# Patient Record
Sex: Male | Born: 1950 | Race: Black or African American | Hispanic: No | Marital: Married | Smoking: Former smoker
Health system: Southern US, Community
[De-identification: ages and names within clinical notes are randomized; demographics above are authoritative.]

## PROBLEM LIST (undated history)

## (undated) DIAGNOSIS — I509 Heart failure, unspecified: Secondary | ICD-10-CM

## (undated) DIAGNOSIS — I219 Acute myocardial infarction, unspecified: Secondary | ICD-10-CM

## (undated) DIAGNOSIS — J449 Chronic obstructive pulmonary disease, unspecified: Secondary | ICD-10-CM

## (undated) DIAGNOSIS — I2510 Atherosclerotic heart disease of native coronary artery without angina pectoris: Secondary | ICD-10-CM

## (undated) HISTORY — DX: Heart failure, unspecified: I50.9

## (undated) HISTORY — PX: CORONARY ARTERY BYPASS GRAFT: SHX141

## (undated) HISTORY — PX: CARDIAC DEFIBRILLATOR PLACEMENT: SHX171

## (undated) HISTORY — DX: Chronic obstructive pulmonary disease, unspecified: J44.9

---

## 2006-02-03 ENCOUNTER — Ambulatory Visit: Payer: Self-pay | Admitting: Orthopedic Surgery

## 2007-11-10 ENCOUNTER — Ambulatory Visit: Payer: Self-pay | Admitting: Orthopedic Surgery

## 2007-11-17 ENCOUNTER — Ambulatory Visit: Payer: Self-pay | Admitting: Orthopedic Surgery

## 2010-07-29 ENCOUNTER — Emergency Department: Payer: Self-pay | Admitting: Emergency Medicine

## 2010-11-18 ENCOUNTER — Ambulatory Visit: Payer: Self-pay | Admitting: Internal Medicine

## 2013-01-08 ENCOUNTER — Emergency Department: Payer: Self-pay | Admitting: Emergency Medicine

## 2013-01-08 LAB — URINALYSIS, COMPLETE
Bacteria: NONE SEEN
Bilirubin,UR: NEGATIVE
Blood: NEGATIVE
Glucose,UR: NEGATIVE mg/dL (ref 0–75)
Ketone: NEGATIVE
Leukocyte Esterase: NEGATIVE
Nitrite: NEGATIVE
Protein: NEGATIVE
RBC,UR: 1 /HPF (ref 0–5)
Squamous Epithelial: NONE SEEN

## 2013-01-09 LAB — COMPREHENSIVE METABOLIC PANEL
Anion Gap: 2 — ABNORMAL LOW (ref 7–16)
Bilirubin,Total: 0.3 mg/dL (ref 0.2–1.0)
Chloride: 107 mmol/L (ref 98–107)
Co2: 30 mmol/L (ref 21–32)
Creatinine: 1.03 mg/dL (ref 0.60–1.30)
EGFR (African American): >60
EGFR (Non-African Amer.): >60
Potassium: 3.6 mmol/L (ref 3.5–5.1)
SGOT(AST): 24 U/L (ref 15–37)

## 2013-01-09 LAB — CBC WITH DIFFERENTIAL/PLATELET
Basophil #: 0.1 10*3/uL (ref 0.0–0.1)
HCT: 40.3 % (ref 40.0–52.0)
HGB: 13.2 g/dL (ref 13.0–18.0)
MCHC: 32.7 g/dL (ref 32.0–36.0)
MCV: 77 fL — ABNORMAL LOW (ref 80–100)
Monocyte %: 9.4 %
Neutrophil #: 4.3 10*3/uL (ref 1.4–6.5)
Platelet: 141 10*3/uL — ABNORMAL LOW (ref 150–440)
RBC: 5.26 10*6/uL (ref 4.40–5.90)
RDW: 15.4 % — ABNORMAL HIGH (ref 11.5–14.5)
WBC: 8.9 10*3/uL (ref 3.8–10.6)

## 2013-01-09 LAB — LIPASE, BLOOD: Lipase: 212 U/L (ref 73–393)

## 2014-03-13 ENCOUNTER — Encounter: Admit: 2014-03-13 | Disposition: A | Discharge status: Home / Self Care | Payer: Self-pay

## 2014-03-16 ENCOUNTER — Ambulatory Visit: Payer: Self-pay | Admitting: Gastroenterology

## 2014-04-13 ENCOUNTER — Encounter: Admit: 2014-04-13 | Disposition: A | Discharge status: Home / Self Care | Payer: Self-pay

## 2014-08-29 ENCOUNTER — Other Ambulatory Visit: Payer: Self-pay | Admitting: Nurse Practitioner

## 2014-08-29 DIAGNOSIS — Z959 Presence of cardiac and vascular implant and graft, unspecified: Secondary | ICD-10-CM

## 2014-08-30 ENCOUNTER — Other Ambulatory Visit: Payer: Self-pay | Admitting: Nurse Practitioner

## 2014-08-30 DIAGNOSIS — Z959 Presence of cardiac and vascular implant and graft, unspecified: Secondary | ICD-10-CM

## 2014-08-31 ENCOUNTER — Ambulatory Visit
Admission: RE | Admit: 2014-08-31 | Discharge: 2014-08-31 | Disposition: A | Discharge status: Home / Self Care | Payer: Non-veteran care | Source: Ambulatory Visit | Attending: Nurse Practitioner | Admitting: Nurse Practitioner

## 2014-08-31 DIAGNOSIS — Z959 Presence of cardiac and vascular implant and graft, unspecified: Secondary | ICD-10-CM

## 2014-09-07 ENCOUNTER — Other Ambulatory Visit: Payer: Self-pay

## 2015-02-02 IMAGING — CT CT ABD-PELV W/ CM
2 of 5 series · 16 of 46 positions shown, 18 images · IV contrast (agent unspecified)
Comparison: None.

CLINICAL DATA: Sharp abdominal pain since [DATE]. Constipation.
Lower abdominal pain and.

EXAM:
CT ABDOMEN AND PELVIS WITH CONTRAST
TECHNIQUE: Multidetector CT imaging of the abdomen and pelvis was performed
using the standard protocol following bolus administration of
intravenous contrast.
CONTRAST:  100 mL Osovue-XVV

[Series 2: routine abd pel with · axial · 0.62mm/px · z∈[-449,-34]mm · 13 of 93 slices shown, 15 images]
[im 5/93  soft-tissue]
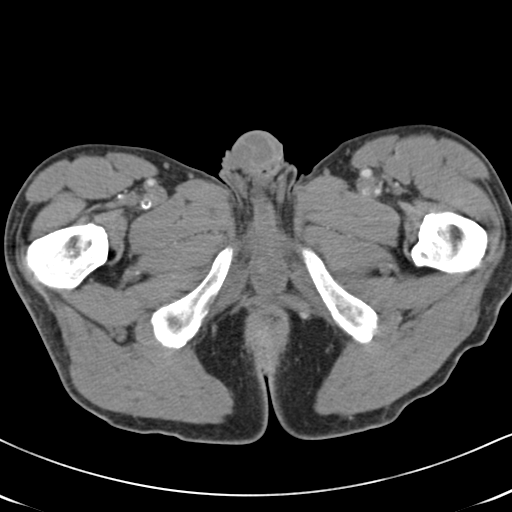
[im 5/93  bone]
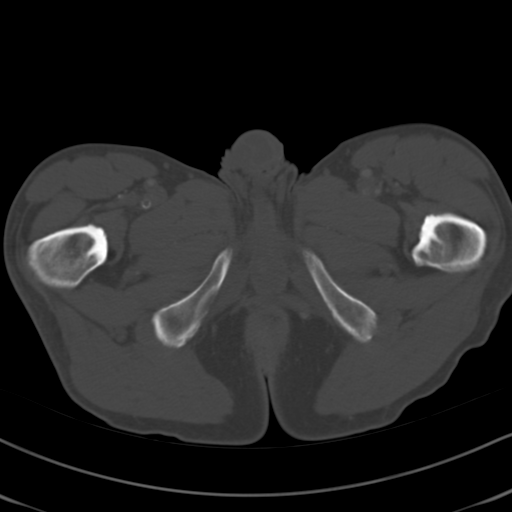
[im 14/93  soft-tissue]
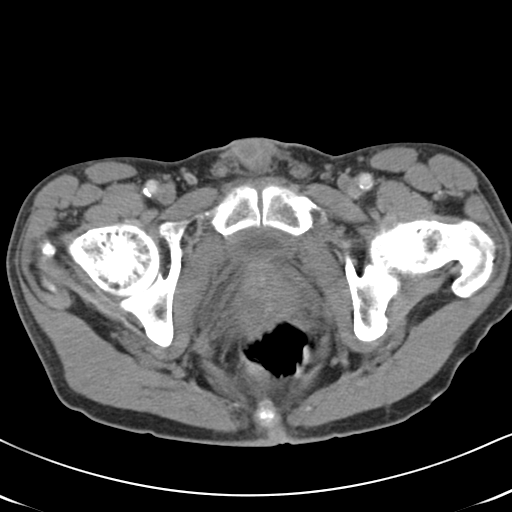
[im 18/93  soft-tissue]
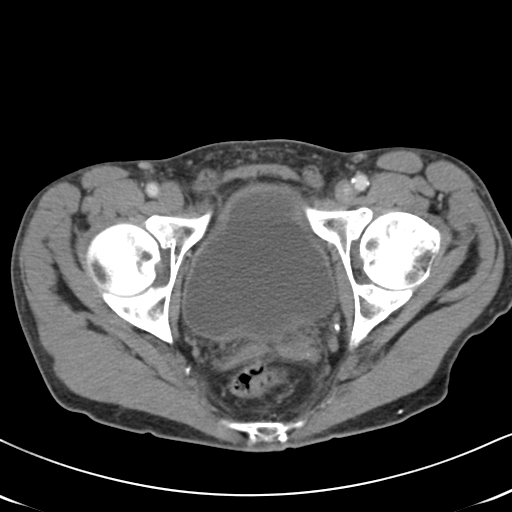
[im 27/93  soft-tissue]
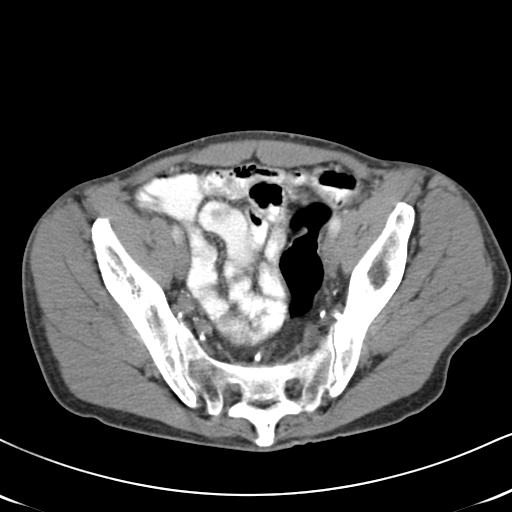
[im 31/93  soft-tissue]
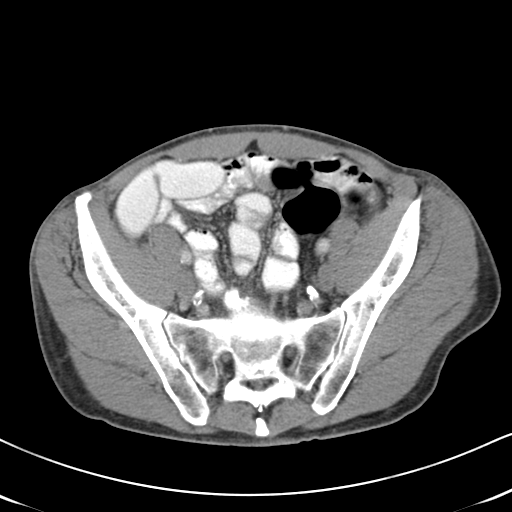
[im 40/93  soft-tissue]
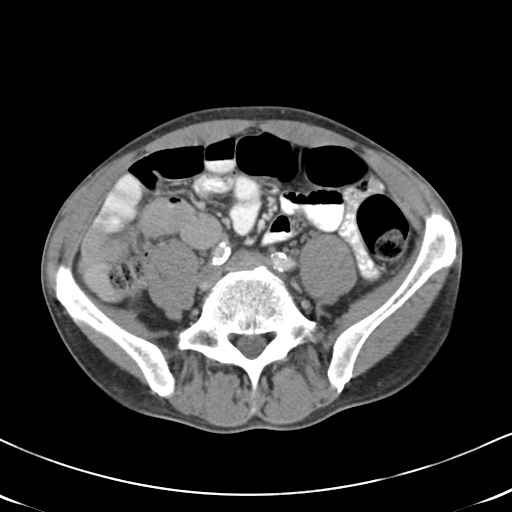
[im 49/93  soft-tissue]
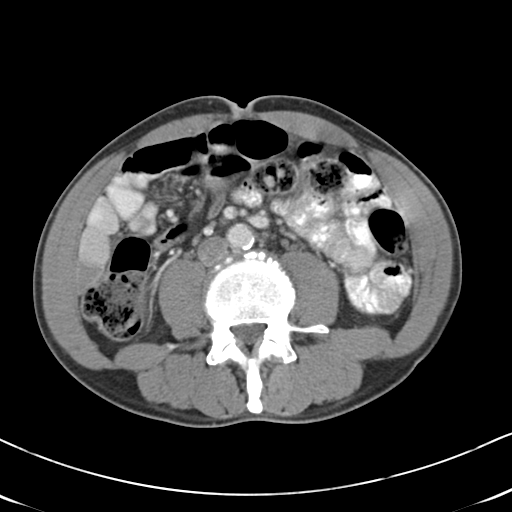
[im 53/93  soft-tissue]
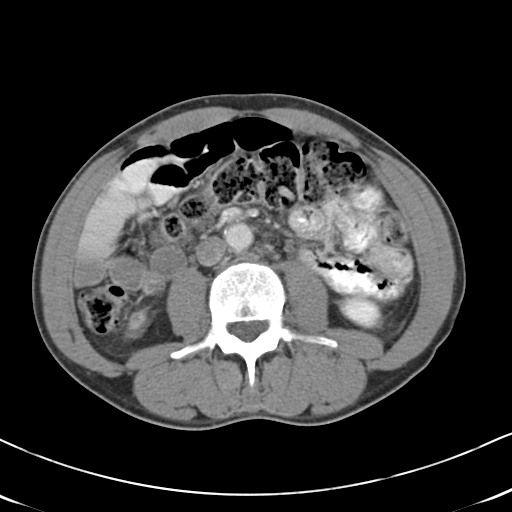
[im 62/93  soft-tissue]
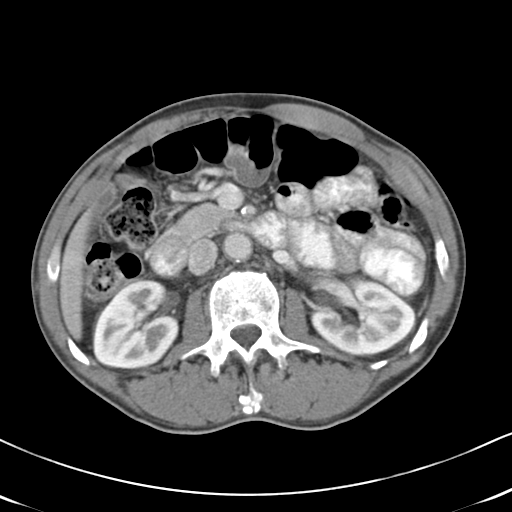
[im 62/93  bone]
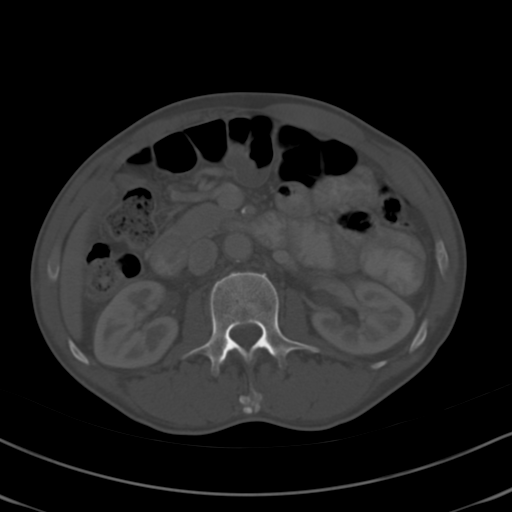
[im 66/93  soft-tissue]
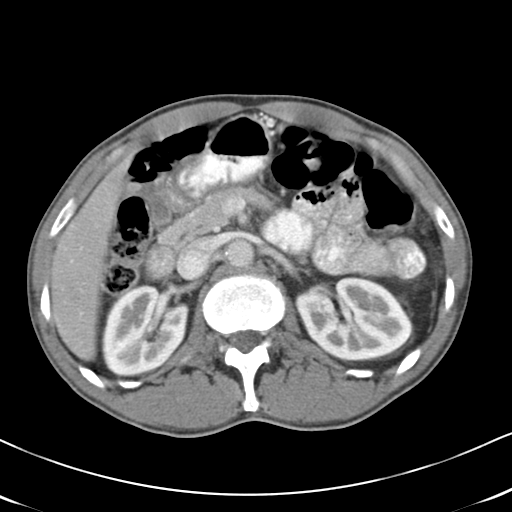
[im 75/93  soft-tissue]
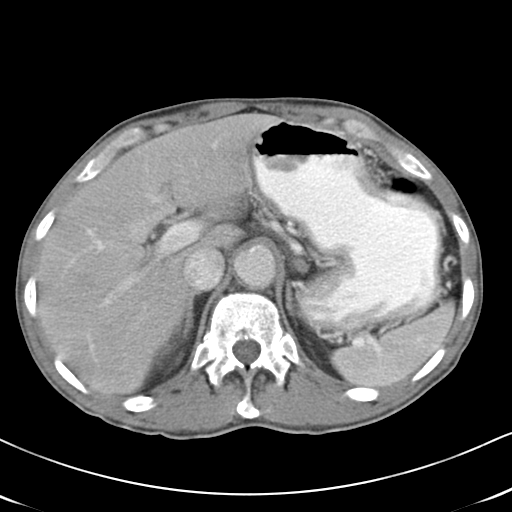
[im 79/93  soft-tissue]
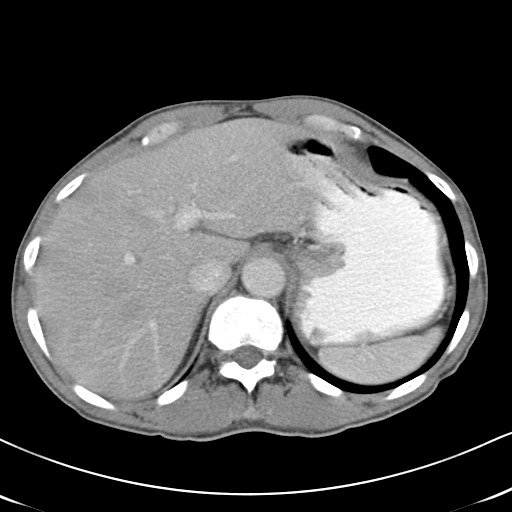
[im 88/93  soft-tissue]
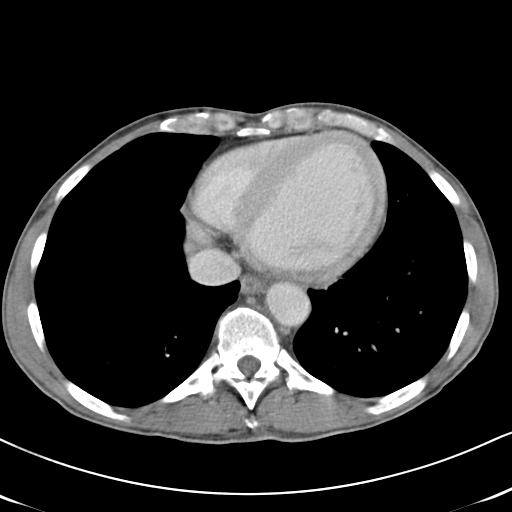

[Series 6: cor routine abd pel with · coronal · 0.63mm/px · 3 of 99 slices shown]
[im 33/99  soft-tissue]
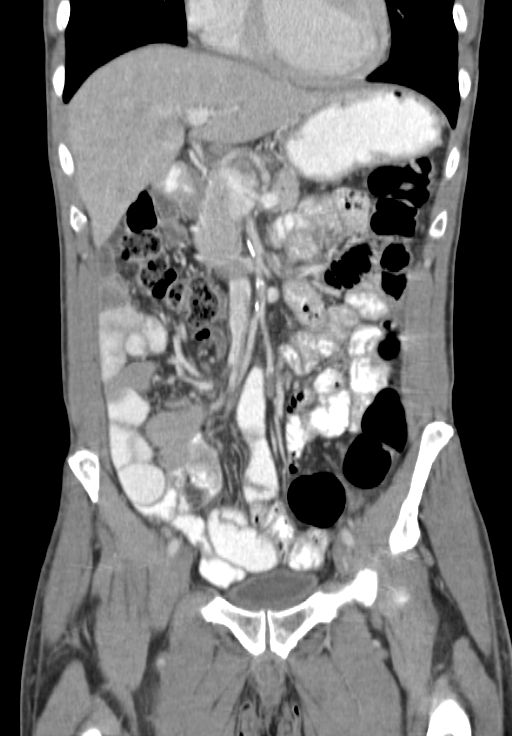
[im 44/99  soft-tissue]
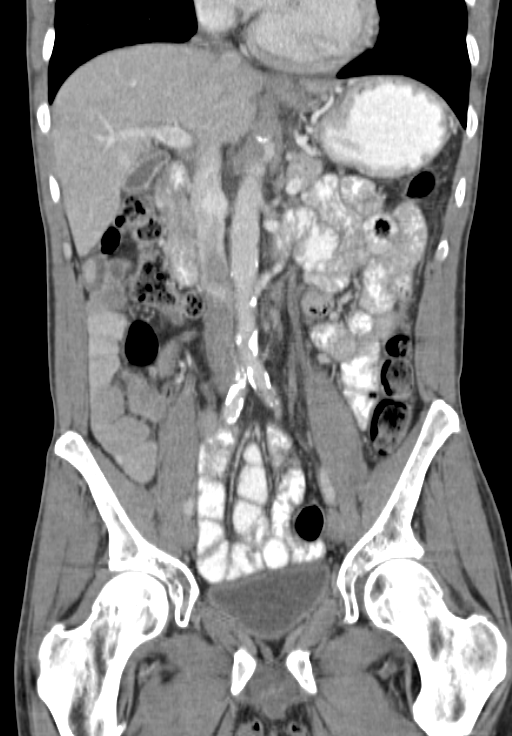
[im 55/99  soft-tissue]
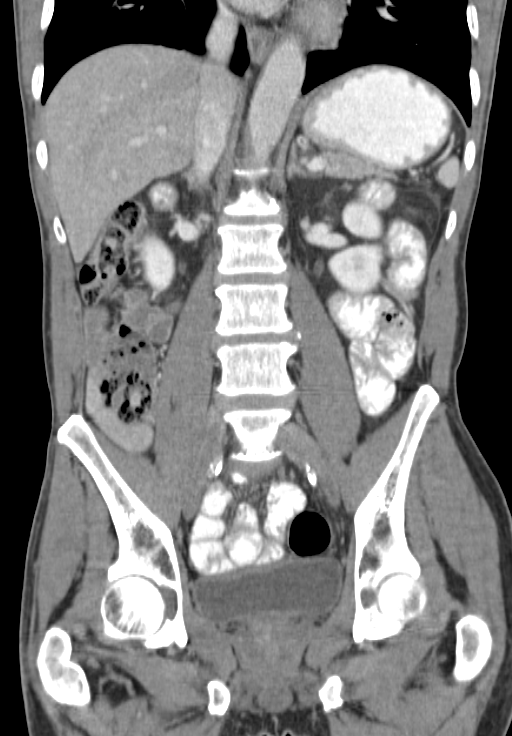

[16 of 46 positions shown; findings below may reference images not displayed]

FINDINGS: The lung bases are clear.

Focal area of fatty infiltration in the left lobe of the liver.
Liver is otherwise unremarkable. The gallbladder is mildly
contracted, likely to be physiologic. Spleen size is normal.
Pancreas, adrenal glands, kidneys, abdominal aorta, inferior vena
cava, and retroperitoneal lymph nodes are unremarkable. Vascular
calcifications are present. Vascular calcification and eccentric
thrombus in the superior mesenteric artery causes some narrowing
without complete inclusion. Mesenteric angina could be considered if
this fits the clinical symptoms. The stomach, small bowel, and colon
are not abnormally distended. No free air or free fluid in the
abdomen.

Pelvis: The appendix is normal. Prostate gland is not enlarged.
Bladder wall is not thickened. No free or loculated pelvic fluid
collections. No evidence of diverticulitis. No significant pelvic
lymphadenopathy. Degenerative changes in the lumbar spine.
IMPRESSION: No acute process demonstrated in the abdomen or pelvis. Vascular
calcifications with calcification and eccentric thrombus in the
superior mesenteric artery. No occlusion. No changes to suggest
bowel necrosis.

## 2015-03-12 ENCOUNTER — Emergency Department
Admission: EM | Admit: 2015-03-12 | Discharge: 2015-03-12 | Disposition: A | Discharge status: Home / Self Care | Payer: Medicare Other | Attending: Emergency Medicine | Admitting: Emergency Medicine

## 2015-03-12 ENCOUNTER — Encounter: Payer: Self-pay | Admitting: Emergency Medicine

## 2015-03-12 DIAGNOSIS — T25021A Burn of unspecified degree of right foot, initial encounter: Secondary | ICD-10-CM | POA: Diagnosis present

## 2015-03-12 DIAGNOSIS — X19XXXA Contact with other heat and hot substances, initial encounter: Secondary | ICD-10-CM | POA: Diagnosis not present

## 2015-03-12 DIAGNOSIS — Z7982 Long term (current) use of aspirin: Secondary | ICD-10-CM | POA: Insufficient documentation

## 2015-03-12 DIAGNOSIS — Y9289 Other specified places as the place of occurrence of the external cause: Secondary | ICD-10-CM | POA: Insufficient documentation

## 2015-03-12 DIAGNOSIS — Y998 Other external cause status: Secondary | ICD-10-CM | POA: Insufficient documentation

## 2015-03-12 DIAGNOSIS — Y9389 Activity, other specified: Secondary | ICD-10-CM | POA: Diagnosis not present

## 2015-03-12 DIAGNOSIS — E119 Type 2 diabetes mellitus without complications: Secondary | ICD-10-CM | POA: Diagnosis not present

## 2015-03-12 DIAGNOSIS — Z79899 Other long term (current) drug therapy: Secondary | ICD-10-CM | POA: Diagnosis not present

## 2015-03-12 DIAGNOSIS — Z87891 Personal history of nicotine dependence: Secondary | ICD-10-CM | POA: Insufficient documentation

## 2015-03-12 DIAGNOSIS — T25222A Burn of second degree of left foot, initial encounter: Secondary | ICD-10-CM | POA: Diagnosis not present

## 2015-03-12 HISTORY — DX: Acute myocardial infarction, unspecified: I21.9

## 2015-03-12 HISTORY — DX: Atherosclerotic heart disease of native coronary artery without angina pectoris: I25.10

## 2015-03-12 MED ORDER — SILVER SULFADIAZINE 1 % EX CREA
TOPICAL_CREAM | Freq: Once | CUTANEOUS | Status: AC
  Administered 2015-03-12: 13:00:00 via TOPICAL
  Filled 2015-03-12: qty 85

## 2015-03-12 MED ORDER — TRAMADOL HCL 50 MG PO TABS: 50.0000 mg | ORAL_TABLET | Freq: Two times a day (BID) | ORAL | Status: DC | PRN

## 2015-03-12 MED ORDER — SILVER SULFADIAZINE 1 % EX CREA: TOPICAL_CREAM | CUTANEOUS | Status: DC

## 2015-03-12 MED ORDER — TRAMADOL HCL 50 MG PO TABS
50.0000 mg | ORAL_TABLET | Freq: Once | ORAL | Status: AC
  Administered 2015-03-12: 50 mg via ORAL
  Filled 2015-03-12: qty 1
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
## 2015-03-12 NOTE — Discharge Instructions (Signed)
Second-Degree Burn °A second-degree burn affects the 2 outer layers of skin. The outer layer (epidermis) and the layer underneath it (dermis) are both burned. Another name for this type of burn is a partial thickness burn. A second-degree burn may be called minor or major. This depends on the size of the burn. It also depends on what parts of the skin are burned. Minor burns may be treated with first aid. Major burns are a medical emergency. °A second-degree burn is worse than a first-degree burn, but not as bad as a third-degree burn. A first-degree burn affects only the epidermis. A third-degree burn goes through all the layers of skin. A second-degree burn usually heals in 3 to 4 weeks. A minor second-degree burn usually does not leave a scar. Deeper second-degree burns may lead to scarring of the skin or contractures over joints. Contractures are scars that form over joints and may lead to reduced mobility at those joints. °CAUSES °· Heat (thermal) injury. This happens when skin comes in contact with something very hot. It could be a flame, a hot object, hot liquid, or steam. Most second-degree burns are thermal injuries. °· Radiation. Sunlight is one type of radiation that can burn the skin. Another type of radiation is used to heat food. Radiation is also used to treat some diseases, such as cancer. All types of radiation can burn the skin. Sunlight usually causes a first-degree burn. Radiation used for heating food or treating a disease can cause a second-degree burn. °· Electricity. Electrical burns can cause more damage under the skin than on the surface. They should always be treated as major burns. °· Chemicals. Many chemicals can burn the skin. The burn should be flushed with cool water and checked by an emergency caregiver. °SYMPTOMS °Symptoms of second-degree burns include: °· Severe pain. °· Extreme tenderness. °· Deep redness. °· Blistered skin. °· Skin that has changed color. It might look blotchy,  wet, or shiny. °· Swelling. °TREATMENT °Some second-degree burns may need to be treated in a hospital. These include major burns, electrical burns, and chemical burns. Many other second-degree burns can be treated with regular first aid, such as: °· Cooling the burn. Use cool, germ-free (sterile) salt water. Place the burned area of skin into a tub of water, or cover the burned area with clean, wet towels. °· Taking pain medicine. °· Removing the dead skin from broken blisters. A trained caregiver may do this. Do not pop blisters. °· Gently washing your skin with mild soap. °· Covering the burned area with a cream. Silver sulfadiazine is a cream for burns. An antibiotic cream, such as bacitracin, may also be used to fight infection. Do not use other ointments or creams unless your caregiver says it is okay. °· Protecting the burn with a sterile, non-sticky bandage. °· Bandaging fingers and toes separately. This keeps them from sticking together. °· Taking an antibiotic. This can help prevent infection. °· Getting a tetanus shot. °HOME CARE INSTRUCTIONS °Medication °· Take any medicine prescribed by your caregiver. Follow the directions carefully. °· Ask your caregiver if you can take over-the-counter medicine to relieve pain and swelling. Do not give aspirin to children. °· Make sure your caregiver knows about all other medicines you take. This includes over-the-counter medicines. °Burn care °· You will need to change the bandage on your burn. You may need to do this 2 or 3 times each day. °¨ Gently clean the burned area. °¨ Put ointment on it. °¨ Cover the burn with a sterile bandage. °·   For some deeper burns or burns that cover a large area, compression garments may be prescribed. These garments can help minimize scarring and protect your mobility. °· Do not put butter or oil on your skin. Use only the cream prescribed by your caregiver. °· Do not put ice on your burn. °· Do not break blisters on your  skin. °· Keep the bandaged area dry. You might need to take a sponge bath for awhile. Ask your caregiver when you can take a shower or a tub bath again. °· Do not scratch an itchy burn. Your caregiver may give you medicine to relieve very bad itching. °· Infection is a big danger after a second-degree burn. Tell your caregiver right away if you have signs of infection, such as: °¨ Redness or changing color in the burned area. °¨ Fluid leaking from the burn. °¨ Swelling in the burn area. °¨ A bad smell coming from the wound. °Follow-up °· Keep all follow-up appointments. This is important. This is how your caregiver can tell if your treatment is working. °· Protect your burn from sunlight. Use sunscreen whenever you go outside. Burned areas may be sensitive to the sun for up to 1 year. Exposure to the sun may also cause permanent darkening of scars. °SEEK MEDICAL CARE IF: °· You have any questions about medicines. °· You have any questions about your treatment. °· You wonder if it is okay to do a particular activity. °· You develop a fever of more than 100.5° F (38.1° C). °SEEK IMMEDIATE MEDICAL CARE IF: °· You think your burn might be infected. It may change color, become red, leak fluid, swell, or smell bad. °· You develop a fever of more than 102° F (38.9° C). °  °This information is not intended to replace advice given to you by your health care provider. Make sure you discuss any questions you have with your health care provider. °  °Document Released: 06/02/2010 Document Revised: 03/23/2011 Document Reviewed: 06/02/2010 °Elsevier Interactive Patient Education ©2016 [employer] ° °

## 2015-03-20 ENCOUNTER — Emergency Department
Admission: EM | Admit: 2015-03-20 | Discharge: 2015-03-20 | Disposition: A | Discharge status: Home / Self Care | Payer: Medicare Other | Attending: Student | Admitting: Student

## 2015-03-20 DIAGNOSIS — Z7982 Long term (current) use of aspirin: Secondary | ICD-10-CM | POA: Insufficient documentation

## 2015-03-20 DIAGNOSIS — Y9289 Other specified places as the place of occurrence of the external cause: Secondary | ICD-10-CM | POA: Diagnosis not present

## 2015-03-20 DIAGNOSIS — X102XXA Contact with fats and cooking oils, initial encounter: Secondary | ICD-10-CM | POA: Diagnosis not present

## 2015-03-20 DIAGNOSIS — Z87891 Personal history of nicotine dependence: Secondary | ICD-10-CM | POA: Insufficient documentation

## 2015-03-20 DIAGNOSIS — Y9389 Activity, other specified: Secondary | ICD-10-CM | POA: Diagnosis not present

## 2015-03-20 DIAGNOSIS — Y998 Other external cause status: Secondary | ICD-10-CM | POA: Insufficient documentation

## 2015-03-20 DIAGNOSIS — Z792 Long term (current) use of antibiotics: Secondary | ICD-10-CM | POA: Insufficient documentation

## 2015-03-20 DIAGNOSIS — Z79899 Other long term (current) drug therapy: Secondary | ICD-10-CM | POA: Diagnosis not present

## 2015-03-20 DIAGNOSIS — T300 Burn of unspecified body region, unspecified degree: Secondary | ICD-10-CM

## 2015-03-20 DIAGNOSIS — T25222A Burn of second degree of left foot, initial encounter: Secondary | ICD-10-CM | POA: Insufficient documentation

## 2015-03-20 MED ORDER — TRAMADOL HCL 50 MG PO TABS: 50.0000 mg | ORAL_TABLET | Freq: Two times a day (BID) | ORAL | Status: AC | PRN
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
## 2016-09-23 IMAGING — US US EXTREM LOW DUPLEX ARTERIAL BILAT
1 series · 14 of 25 positions shown · non-contrast
Comparison: None available

CLINICAL DATA: SMA stent, bilateral SFA angioplasty use.

EXAM:
BILATERAL LOWER EXTREMITY ARTERIAL DUPLEX SCAN
TECHNIQUE: Gray-scale sonography as well as color Doppler and duplex ultrasound
was performed to evaluate the arteries of both lower extremities.

[Series 1: us extrem low duplex arterial bilat · 0.08mm/px · 14 of 33 slices shown]
[im 1/33]
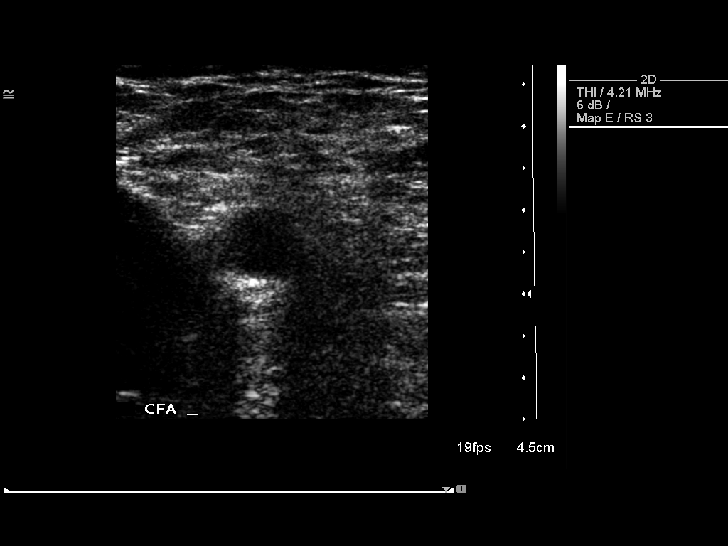
[im 3/33]
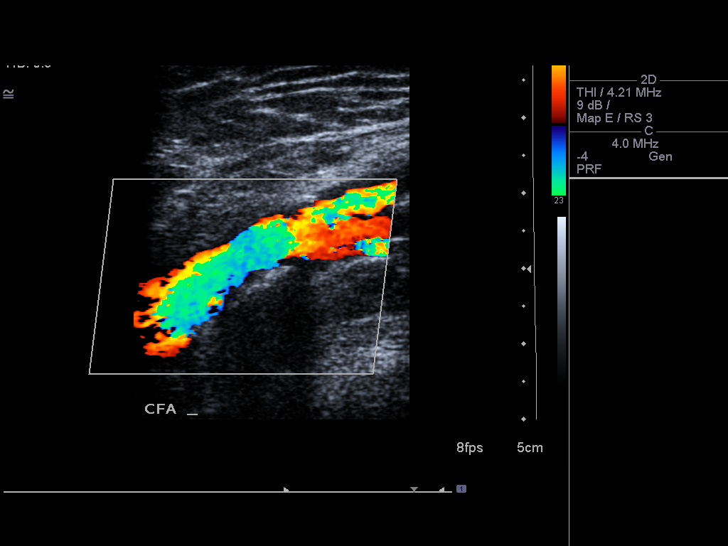
[im 6/33]
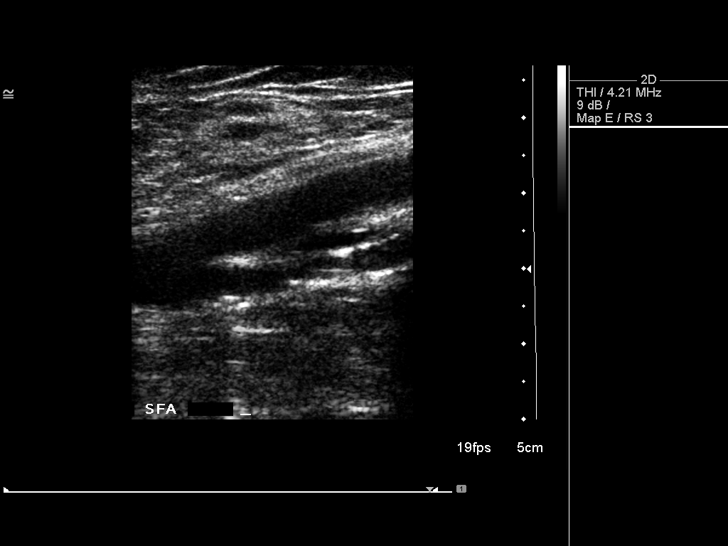
[im 9/33]
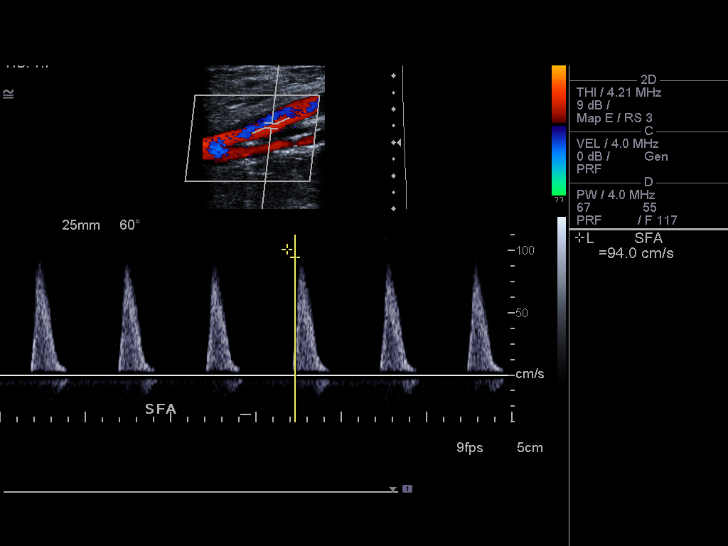
[im 11/33]
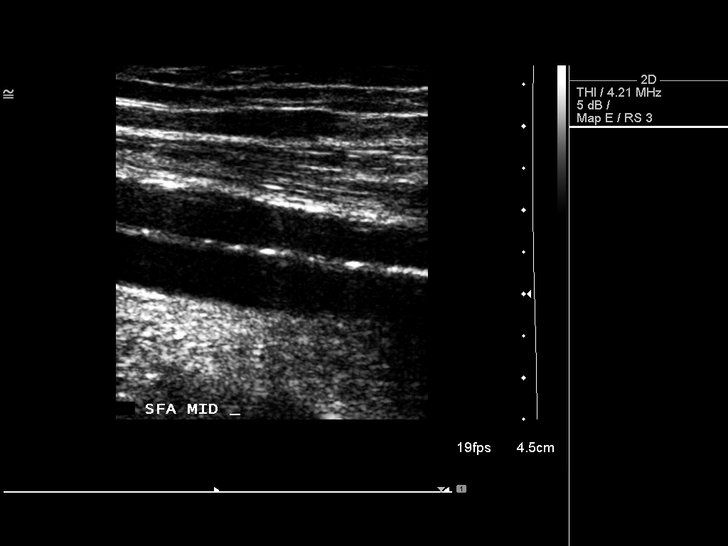
[im 13/33]
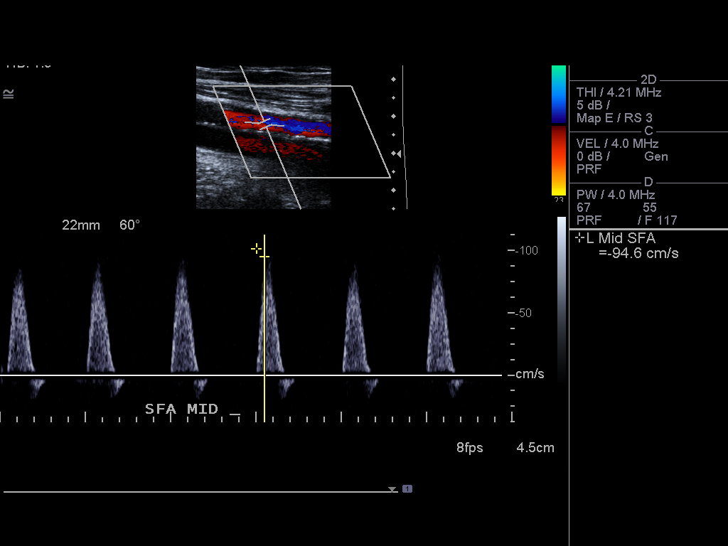
[im 15/33]
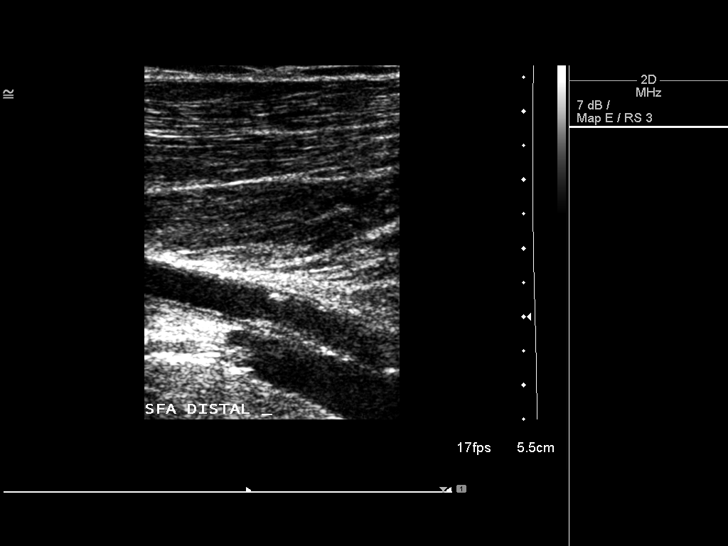
[im 18/33]
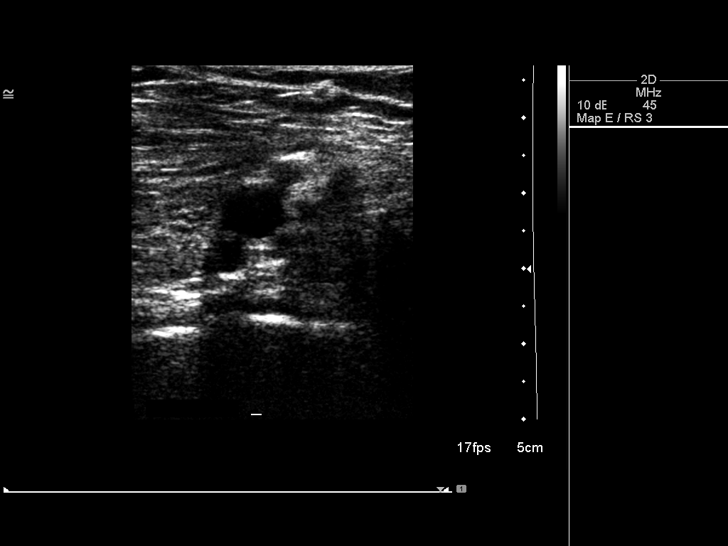
[im 21/33]
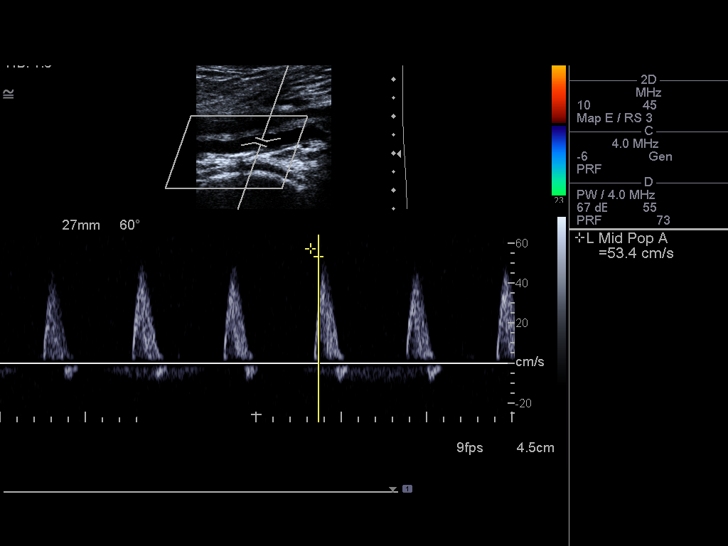
[im 22/33]
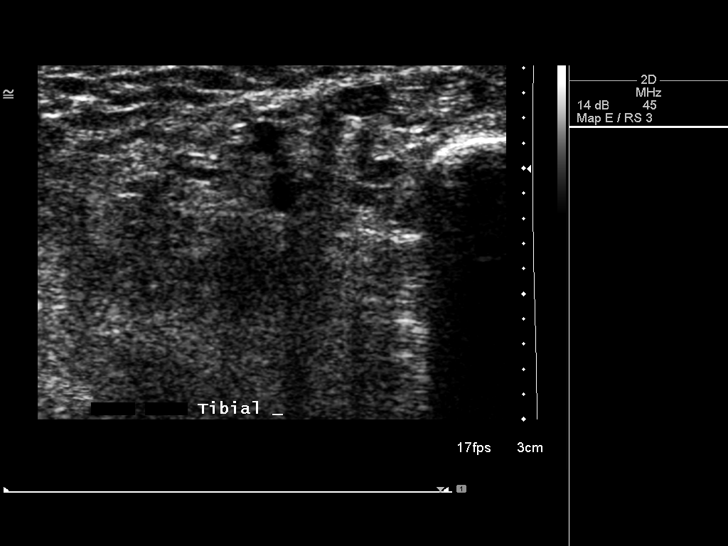
[im 25/33]
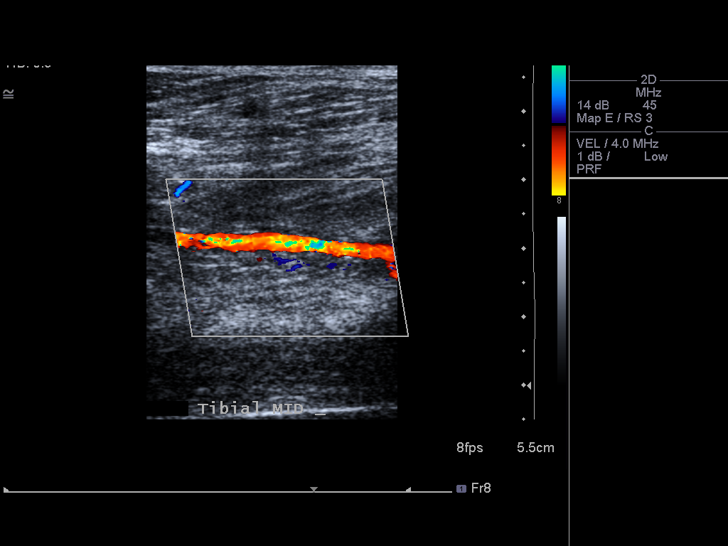
[im 27/33]
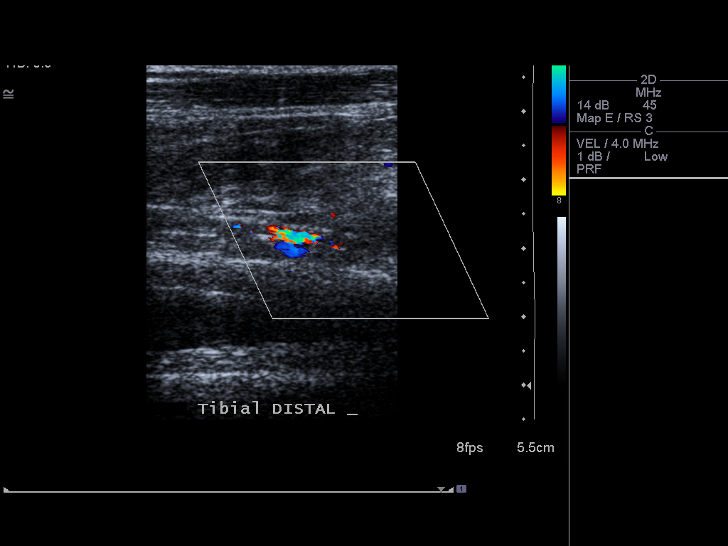
[im 30/33]
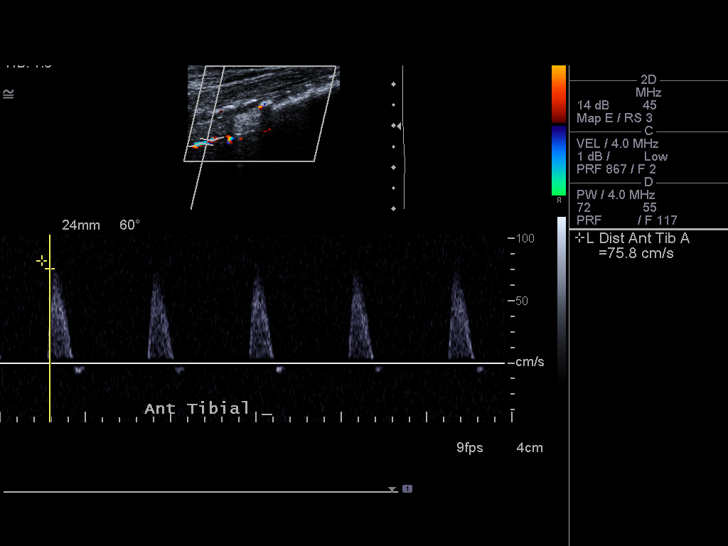
[im 33/33]
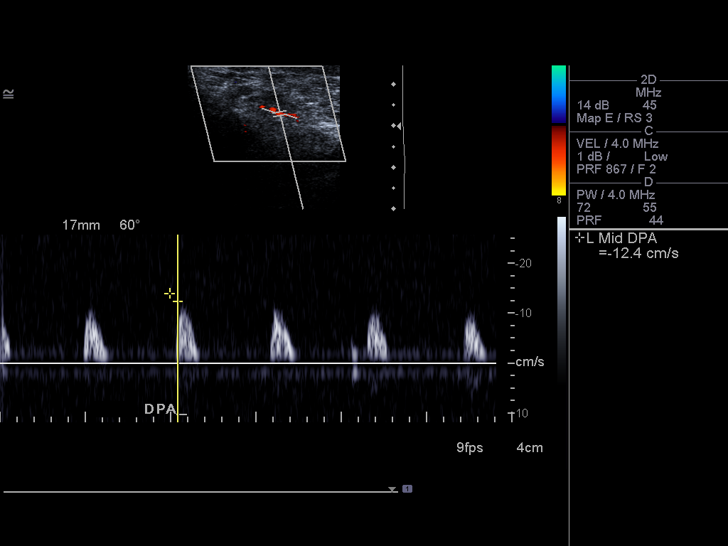

[14 of 25 positions shown; findings below may reference images not displayed]

FINDINGS: Biphasic waveforms are recorded through the tibial arteries. ABI
1.0. There is a monophasic waveform in the dorsalis pedis. Scattered
atheromatous plaque. No focally elevated peak systolic velocities or
aliasing on color Doppler interrogation on survey imaging.
IMPRESSION: Mild diffuse atheromatous plaque.  No focal stenosis or occlusion .

## 2018-07-09 ENCOUNTER — Other Ambulatory Visit: Payer: Self-pay | Admitting: *Deleted

## 2018-07-09 DIAGNOSIS — Z20822 Contact with and (suspected) exposure to covid-19: Secondary | ICD-10-CM

## 2018-07-14 ENCOUNTER — Telehealth: Payer: Self-pay

## 2018-07-14 NOTE — Telephone Encounter (Signed)
Called pt to schedule consult appt. [facility].

## 2018-07-15 LAB — NOVEL CORONAVIRUS, NAA: SARS-CoV-2, NAA: NOT DETECTED

## 2018-08-03 ENCOUNTER — Telehealth: Payer: Self-pay | Admitting: Internal Medicine

## 2018-08-03 NOTE — Telephone Encounter (Signed)
Called patient for COVID-19 pre-screening for in office visit. ° °Have you recently traveled any where out of the local area in the last 2 weeks? No ° °Have you been in close contact with a person diagnosed with COVID-19 or someone awaiting results within the last 2 weeks? No ° °Do you currently have any of the following symptoms? If so, when did they start? °Cough     Diarrhea   Joint Pain °Fever      Muscle Pain   Red eyes °Shortness of breath   Abdominal pain  Vomiting °Loss of smell    Rash    Sore Throat °Headache    Weakness   Bruising or bleeding ° ° °Okay to proceed with visit 08/04/2018 °  ° ° °

## 2018-08-03 NOTE — Telephone Encounter (Signed)
Appt. Made, nothing further needed.

## 2018-08-03 NOTE — Telephone Encounter (Signed)
Called pt x2 to schedule consult appt. No vm set up.

## 2018-08-04 ENCOUNTER — Other Ambulatory Visit: Payer: Self-pay

## 2018-08-04 ENCOUNTER — Ambulatory Visit (INDEPENDENT_AMBULATORY_CARE_PROVIDER_SITE_OTHER): Payer: Medicare Other | Admitting: Internal Medicine

## 2018-08-04 ENCOUNTER — Encounter: Payer: Self-pay | Admitting: Internal Medicine

## 2018-08-04 VITALS — BP 130/72 | HR 70 | Temp 97.0°F | Ht 71.0 in | Wt 171.2 lb

## 2018-08-04 DIAGNOSIS — J449 Chronic obstructive pulmonary disease, unspecified: Secondary | ICD-10-CM | POA: Diagnosis not present

## 2018-08-04 NOTE — Patient Instructions (Addendum)
PLEASE OBTAIN ALL RECORDS  Continue inhalers as prescribed

## 2018-08-04 NOTE — Progress Notes (Signed)
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  Patient is 2 pack a day smoker for 40 years Patient has chronic shortness of breath and dyspnea on exertion He can go 20 feet without getting short of breath  No fevers no chills no signs of infection at this time No evidence of COPD exacerbation at this time  Patient has significant cardiac history with defibrillator Has a history of CHF with significant coronary artery disease Sees a cardiologist at [facility] No chest pain or palpitations at this time  With regards to his diagnosis of COPD patient has a pulmonologist at the [facility] It seems that the pulmonologist at the [facility] has ordered several tests including 6-minute walk test pulmonary function testing as well as a CT chest to assess for lung cancer  At this time I do not have any of these records and I do not want to repeat any testing the patient does not need  I have explained to the patient that [facility] I have looked at care everywhere I do not see any type of documentation of his pulmonary history  Patient not in distress at this time and he feels most of his problems are from his heart  He has chronic dyspnea on exertion that is I have explained to him that the symptoms will last indefinitely    PAST MEDICAL HISTORY :   has a past medical history of CHF (congestive heart failure) ([place]), COPD (chronic obstructive pulmonary disease) ([place]), Coronary artery disease, and MI (myocardial infarction) ([place]).  has a past surgical history that includes Coronary artery bypass graft and Cardiac defibrillator placement. Prior to  Admission medications   Medication Sig Start Date End Date Taking? Authorizing Provider  albuterol (VENTOLIN HFA) 108 (90 Base) MCG/ACT inhaler Inhale into the lungs.   Yes [provider]  aspirin 81 MG tablet Take 81 mg by mouth daily.   Yes [provider]  gabapentin (NEURONTIN) 300 MG capsule Take 300 mg by mouth 3 (three) times daily.   Yes [provider]  losartan (COZAAR) 50 MG tablet Take by mouth.   Yes [provider]  metoprolol (LOPRESSOR) 50 MG tablet Take 50 mg by mouth 2 (two) times daily.   Yes [provider]  simvastatin (ZOCOR) 20 MG tablet Take 20 mg by mouth daily.   Yes [provider]  spironolactone (ALDACTONE) 25 MG tablet Take by mouth. 08/03/17  Yes [provider]  sildenafil (VIAGRA) 50 MG tablet Take by mouth.    [provider]   Allergies  Allergen Reactions  . Lisinopril Swelling    Lip swelling    FAMILY HISTORY:  family history includes Aneurysm in his father and mother. SOCIAL HISTORY:  reports that he quit smoking about 5 years ago. His smoking use included cigarettes. He has a 86.00 pack-year smoking history. He has never used smokeless tobacco. He reports current alcohol use. He reports previous drug use. Drugs: Cocaine and Marijuana.    Review of Systems:  Gen:  Denies  fever, sweats, chills weigh loss  HEENT: Denies blurred vision, double vision, ear  pain, eye pain, hearing loss, nose bleeds, sore throat Cardiac:  No dizziness, chest pain or heaviness, chest tightness,edema, No JVD Resp:   No cough, -sputum production, +shortness of breath,-wheezing, -hemoptysis,  Gi: Denies swallowing difficulty, stomach pain, nausea or vomiting, diarrhea, constipation, bowel incontinence Gu:  Denies bladder incontinence, burning urine Ext:   Denies Joint pain, stiffness or swelling Skin: Denies  skin rash, easy bruising or bleeding or hives Endoc:  Denies polyuria, polydipsia ,  polyphagia or weight change Psych:   Denies depression, insomnia or hallucinations  Other:  All other systems negative   BP 130/72 (BP Location: Left Arm, Cuff Size: Normal)   Pulse 70   Temp (!) 97 F (36.1 C) (Temporal)   Ht 5\' 11"  (1.803 m)   Wt 171 lb 3.2 oz (77.7 kg)   SpO2 98%   BMI 23.88 kg/m       SpO2: 98 % O2 Device: None (Room air)    Physical Examination:   GENERAL:NAD, no fevers, chills, no weakness no fatigue HEAD: Normocephalic, atraumatic.  EYES: PERLA, EOMI No scleral icterus.  NECK: Supple. No thyromegaly.  No JVD.  PULMONARY: CTA B/L no wheezing, rhonchi, crackles CARDIOVASCULAR: S1 and S2. Regular rate and rhythm. No murmurs Midline chest incision status post CABG GASTROINTESTINAL: Soft, nontender, nondistended. Positive bowel sounds.  MUSCULOSKELETAL: No swelling, clubbing, or edema.  NEUROLOGIC: No gross focal neurological deficits. 5/5 strength all extremities SKIN: No ulceration, lesions, rashes, or cyanosis.  PSYCHIATRIC: Insight, judgment intact. -depression -anxiety ALL OTHER ROS ARE NEGATIVE   MEDICATIONS: I have reviewed all medications and confirmed regimen as documented       ASSESSMENT AND PLAN SYNOPSIS  68 year old pleasant African-American male with multiple medical issues including severe cardiomyopathy from severe coronary artery disease in the setting of probable underlying severe COPD with chronic dyspnea and exertion in the setting of former extensive smoking history with a severe deconditioned state with poor respiratory insufficiency and cardiac insufficiency  At this time patient [facility] and it seems that there has been extensive work-up regarding his pulmonary function testing and CT chest Unfortunately I do not have access to these records and have explained to the patient that we need these records in order for me to take care of him He is currently on inhaled Advair and Spiriva along  with albuterol as needed  After reviewing records patient will need pulmonary function testing 6-minute walk test overnight pulse oximetry see follow-up CT chest  Patient states that he has a spot in his lungs that they are following   Overall patient's prognosis is guarded He has significant respiratory and cardiac insufficiency which is limiting as physical activity I have explained to the patient that this type of symptoms will be indefinitely The only course of action is to assess for oxygen needs  At some point in time patient will need nebulized therapy as his respiratory insufficiency may progress   COVID-19 EDUCATION: The signs and symptoms of COVID-19 were discussed with the patient and how to seek care for testing.  The importance of social distancing was discussed today. Hand Washing Techniques and avoid touching face was advised.  MEDICATION ADJUSTMENTS/LABS AND TESTS ORDERED: Continue inhalers as prescribed [facility]   CURRENT MEDICATIONS REVIEWED AT LENGTH WITH PATIENT TODAY   Patient satisfied with Plan of action and management. All questions answered  Follow up in 6 months   Jakeob Tullis Santiago Gladavid Latysha Thackston, M.D.  [facility]  Pulmonary & Critical Care Medicine  Medical Director [place] Director [facility] Cardio-Pulmonary Department

## 2018-08-25 ENCOUNTER — Encounter: Payer: No Typology Code available for payment source | Attending: Internal Medicine | Admitting: *Deleted

## 2018-08-25 ENCOUNTER — Encounter: Payer: Self-pay | Admitting: *Deleted

## 2018-08-25 DIAGNOSIS — I255 Ischemic cardiomyopathy: Secondary | ICD-10-CM | POA: Insufficient documentation

## 2018-08-25 DIAGNOSIS — I429 Cardiomyopathy, unspecified: Secondary | ICD-10-CM

## 2018-08-25 NOTE — Progress Notes (Signed)
Initial Orientation Virtual Call completed.   EP/RD appt is on 8/17 Diagnosis documentation can be found in [place] [facility] records

## 2018-08-29 ENCOUNTER — Other Ambulatory Visit: Payer: Self-pay

## 2018-08-29 ENCOUNTER — Encounter: Payer: No Typology Code available for payment source | Admitting: *Deleted

## 2018-08-29 VITALS — Ht 72.0 in | Wt 172.5 lb

## 2018-08-29 DIAGNOSIS — I255 Ischemic cardiomyopathy: Secondary | ICD-10-CM | POA: Diagnosis present

## 2018-08-29 DIAGNOSIS — I429 Cardiomyopathy, unspecified: Secondary | ICD-10-CM

## 2018-08-29 NOTE — Patient Instructions (Signed)
Patient Instructions  Patient Details  Name: Peter Frey MRN: [id number] Date of Birth: 02-08-1950 Referring Provider:  Shireen Quan, MD  Below are your personal goals for exercise, nutrition, and risk factors. Our goal is to help you stay on track towards obtaining and maintaining these goals. We will be discussing your progress on these goals with you throughout the program.  Initial Exercise Prescription: Initial Exercise Prescription - 08/29/18 1200      Date of Initial Exercise RX and Referring Provider   Date  08/29/18    Referring Provider  Nicholes Mango MD      Treadmill   MPH  2.3    Grade  0.5    Minutes  15    METs  2.92      Recumbant Bike   Level  2    RPM  50    Watts  33    Minutes  15    METs  2.5      NuStep   Level  2    SPM  80    Minutes  15    METs  2.5      Recumbant Elliptical   Level  1    RPM  50    Minutes  15    METs  2.5      Prescription Details   Frequency (times per week)  2    Duration  Progress to 30 minutes of continuous aerobic without signs/symptoms of physical distress      Intensity   THRR 40-80% of Max Heartrate  104-137    Ratings of Perceived Exertion  11-13    Perceived Dyspnea  0-4      Progression   Progression  Continue to progress workloads to maintain intensity without signs/symptoms of physical distress.      Resistance Training   Training Prescription  Yes    Weight  4 lbs    Reps  10-15       Exercise Goals: Frequency: Be able to perform aerobic exercise two to three times per week in program working toward 2-5 days per week of home exercise.  Intensity: Work with a perceived exertion of 11 (fairly light) - 15 (hard) while following your exercise prescription.  We will make changes to your prescription with you as you progress through the program.   Duration: Be able to do 30 to 45 minutes of continuous aerobic exercise in addition to a 5 minute warm-up and a 5 minute cool-down routine.    Nutrition Goals: Your personal nutrition goals will be established when you do your nutrition analysis with the dietician.  The following are general nutrition guidelines to follow: Cholesterol < 200mg /day Sodium < 1500mg /day Fiber: Men over 50 yrs - 30 grams per day  Personal Goals: Personal Goals and Risk Factors at Admission - 08/29/18 1244      Core Components/Risk Factors/Patient Goals on Admission    Weight Management  Yes;Weight Maintenance    Intervention  Weight Management: Develop a combined nutrition and exercise program designed to reach desired caloric intake, while maintaining appropriate intake of nutrient and fiber, sodium and fats, and appropriate energy expenditure required for the weight goal.;Weight Management: Provide education and appropriate resources to help participant work on and attain dietary goals.    Admit Weight  172 lb 8 oz (78.2 kg)    Goal Weight: Short Term  172 lb (78 kg)    Goal Weight: Long Term  172 lb (78 kg)  Expected Outcomes  Short Term: Continue to assess and modify interventions until short term weight is achieved;Long Term: Adherence to nutrition and physical activity/exercise program aimed toward attainment of established weight goal;Weight Maintenance: Understanding of the daily nutrition guidelines, which includes 25-35% calories from fat, 7% or less cal from saturated fats, less than  cholesterol, less than 1.5gm of sodium, & 5 or more servings of fruits and vegetables daily    Intervention  Provide education about signs/symptoms and action to take for hypo/hyperglycemia.;Provide education about proper nutrition, including hydration, and aerobic/resistive exercise prescription along with prescribed medications to achieve blood glucose in normal ranges: Fasting glucose 65-99 mg/dL    Expected Outcomes  Short Term: Participant verbalizes understanding of the signs/symptoms and immediate care of hyper/hypoglycemia, proper foot care and  importance of medication, aerobic/resistive exercise and nutrition plan for blood glucose control.;Long Term: Attainment of HbA1C < 7%.    Heart Failure  Yes    Intervention  Provide a combined exercise and nutrition program that is supplemented with education, support and counseling about heart failure. Directed toward relieving symptoms such as shortness of breath, decreased exercise tolerance, and extremity edema.    Expected Outcomes  Improve functional capacity of life;Short term: Attendance in program 2-3 days a week with increased exercise capacity. Reported lower sodium intake. Reported increased fruit and vegetable intake. Reports medication compliance.;Short term: Daily weights obtained and reported for increase. Utilizing diuretic protocols set by physician.;Long term: Adoption of self-care skills and reduction of barriers for early signs and symptoms recognition and intervention leading to self-care maintenance.    Hypertension  Yes    Intervention  Provide education on lifestyle modifcations including regular physical activity/exercise, weight management, moderate sodium restriction and increased consumption of fresh fruit, vegetables, and low fat dairy, alcohol moderation, and smoking cessation.;Monitor prescription use compliance.    Expected Outcomes  Short Term: Continued assessment and intervention until BP is < 140/72mm HG in hypertensive participants. < 130/24mm HG in hypertensive participants with diabetes, heart failure or chronic kidney disease.;Long Term: Maintenance of blood pressure at goal levels.    Lipids  Yes    Intervention  Provide education and support for participant on nutrition & aerobic/resistive exercise along with prescribed medications to achieve LDL 70mg , HDL >40mg .    Expected Outcomes  Short Term: Participant states understanding of desired cholesterol values and is compliant with medications prescribed. Participant is following exercise prescription and nutrition  guidelines.;Long Term: Cholesterol controlled with medications as prescribed, with individualized exercise RX and with personalized nutrition plan. Value goals: LDL < , HDL > 40 mg.       Tobacco Use Initial Evaluation: Social History   Tobacco Use  Smoking Status Former Smoker  . Packs/day: 2.00  . Years: 43.00  . Pack years: 86.00  . Types: Cigarettes  . Quit date: 2015  . Years since quitting: 5.6  Smokeless Tobacco Never Used    Exercise Goals and Review: Exercise Goals    Row Name 08/29/18 1241             Exercise Goals   Increase Physical Activity  Yes       Intervention  Provide advice, education, support and counseling about physical activity/exercise needs.;Develop an individualized exercise prescription for aerobic and resistive training based on initial evaluation findings, risk stratification, comorbidities and participant's personal goals.       Expected Outcomes  Short Term: Attend rehab on a regular basis to increase amount of physical activity.;Long Term: Add in home  exercise to make exercise part of routine and to increase amount of physical activity.;Long Term: Exercising regularly at least 3-5 days a week.       Increase Strength and Stamina  Yes       Intervention  Provide advice, education, support and counseling about physical activity/exercise needs.;Develop an individualized exercise prescription for aerobic and resistive training based on initial evaluation findings, risk stratification, comorbidities and participant's personal goals.       Expected Outcomes  Short Term: Increase workloads from initial exercise prescription for resistance, speed, and METs.;Short Term: Perform resistance training exercises routinely during rehab and add in resistance training at home;Long Term: Improve cardiorespiratory fitness, muscular endurance and strength as measured by increased METs and functional capacity ( )       Able to understand and use rate of perceived  exertion (RPE) scale  Yes       Intervention  Provide education and explanation on how to use RPE scale       Expected Outcomes  Short Term: Able to use RPE daily in rehab to express subjective intensity level;Long Term:  Able to use RPE to guide intensity level when exercising independently       Able to understand and use Dyspnea scale  Yes       Intervention  Provide education and explanation on how to use Dyspnea scale       Expected Outcomes  Short Term: Able to use Dyspnea scale daily in rehab to express subjective sense of shortness of breath during exertion;Long Term: Able to use Dyspnea scale to guide intensity level when exercising independently       Knowledge and understanding of Target Heart Rate Range (THRR)  Yes       Intervention  Provide education and explanation of THRR including how the numbers were predicted and where they are located for reference       Expected Outcomes  Short Term: Able to state/look up THRR;Short Term: Able to use daily as guideline for intensity in rehab;Long Term: Able to use THRR to govern intensity when exercising independently       Able to check pulse independently  Yes       Intervention  Provide education and demonstration on how to check pulse in carotid and radial arteries.;Review the importance of being able to check your own pulse for safety during independent exercise       Expected Outcomes  Short Term: Able to explain why pulse checking is important during independent exercise;Long Term: Able to check pulse independently and accurately       Understanding of Exercise Prescription  Yes       Intervention  Provide education, explanation, and written materials on patient's individual exercise prescription       Expected Outcomes  Short Term: Able to explain program exercise prescription;Long Term: Able to explain home exercise prescription to exercise independently          Copy of goals given to participant.

## 2018-08-29 NOTE — Progress Notes (Signed)
Cardiac Individual Treatment Plan  Patient Details  Name: Peter Frey MRN: [id number] Date of Birth: [birth year] Referring Provider:     Cardiac Rehab from 08/29/2018 in [facility] Cardiac and Pulmonary Rehab  Referring Provider  Nicholes Mango MD      Initial Encounter Date:    Cardiac Rehab from 08/29/2018 in [facility] Cardiac and Pulmonary Rehab  Date  08/29/18      Visit Diagnosis: Cardiomyopathy, unspecified type ([place])  Patient's Home Medications on Admission:  Current Outpatient Medications:  .  albuterol (VENTOLIN HFA) 108 (90 Base) MCG/ACT inhaler, Inhale into the lungs., Disp: , Rfl:  .  aspirin 81 MG tablet, Take 81 mg by mouth daily., Disp: , Rfl:  .  furosemide (LASIX) 40 MG tablet, Take by mouth., Disp: , Rfl:  .  gabapentin (NEURONTIN) 300 MG capsule, Take 300 mg by mouth 3 (three) times daily., Disp: , Rfl:  .  losartan (COZAAR) 50 MG tablet, Take by mouth., Disp: , Rfl:  .  metoprolol (LOPRESSOR) 50 MG tablet, Take 50 mg by mouth 2 (two) times daily., Disp: , Rfl:  .  sildenafil (VIAGRA) 50 MG tablet, Take by mouth., Disp: , Rfl:  .  simvastatin (ZOCOR) 20 MG tablet, Take 20 mg by mouth daily., Disp: , Rfl:  .  spironolactone (ALDACTONE) 25 MG tablet, Take by mouth., Disp: , Rfl:  .  TIOTROPIUM BROMIDE-OLODATEROL IN, Inhale 2 Inhalers into the lungs daily., Disp: , Rfl:   Past Medical History: Past Medical History:  Diagnosis Date  . CHF (congestive heart failure) ([place])   . COPD (chronic obstructive pulmonary disease) ([place])   . Coronary artery disease   . MI (myocardial infarction) ([place])     Tobacco Use: Social History   Tobacco Use  Smoking Status Former Smoker  . Packs/day: 2.00  . Years: 43.00  . Pack years: 86.00  . Types: Cigarettes  . Quit date: 2015  . Years since quitting: 5.6  Smokeless Tobacco Never Used    Labs: Recent Review Flowsheet Data    There is no flowsheet data to display.       Exercise Target Goals: Exercise Program  Goal: Individual exercise prescription set using results from initial 6 min walk test and THRR while considering  patient's activity barriers and safety.   Exercise Prescription Goal: Initial exercise prescription builds to 30-45 minutes a day of aerobic activity, 2-3 days per week.  Home exercise guidelines will be given to patient during program as part of exercise prescription that the participant will acknowledge.  Activity Barriers & Risk Stratification: Activity Barriers & Cardiac Risk Stratification - 08/29/18 1216      Activity Barriers & Cardiac Risk Stratification   Activity Barriers  Shortness of Breath;Deconditioning;Muscular Weakness    Cardiac Risk Stratification  High       6 Minute Walk: 6 Minute Walk    Row Name 08/29/18 1216         6 Minute Walk   Phase  Initial     Distance  1270 feet     Walk Time  6 minutes     # of Rest Breaks  0     MPH  2.41     METS  3.35     RPE  12     Perceived Dyspnea   2     VO2 Peak  11.72     Symptoms  Yes (comment)     Comments  Calf pain 9/10, SOB  Resting HR  71 bpm     Resting BP  124/62     Resting Oxygen Saturation   99 %     Exercise Oxygen Saturation  during 6 min walk  95 %     Max Ex. HR  95 bpm     Max Ex. BP  128/64     2 Minute Post BP  108/64        Oxygen Initial Assessment:   Oxygen Re-Evaluation:   Oxygen Discharge (Final Oxygen Re-Evaluation):   Initial Exercise Prescription: Initial Exercise Prescription - 08/29/18 1200      Date of Initial Exercise RX and Referring Provider   Date  08/29/18    Referring Provider  Nicholes Mango MD      Treadmill   MPH  2.3    Grade  0.5    Minutes  15    METs  2.92      Recumbant Bike   Level  2    RPM  50    Watts  33    Minutes  15    METs  2.5      NuStep   Level  2    SPM  80    Minutes  15    METs  2.5      Recumbant Elliptical   Level  1    RPM  50    Minutes  15    METs  2.5      Prescription Details   Frequency (times per  week)  2    Duration  Progress to 30 minutes of continuous aerobic without signs/symptoms of physical distress      Intensity   THRR 40-80% of Max Heartrate  104-137    Ratings of Perceived Exertion  11-13    Perceived Dyspnea  0-4      Progression   Progression  Continue to progress workloads to maintain intensity without signs/symptoms of physical distress.      Resistance Training   Training Prescription  Yes    Weight  4 lbs    Reps  10-15       Perform Capillary Blood Glucose checks as needed.  Exercise Prescription Changes: Exercise Prescription Changes    Row Name 08/29/18 1200             Response to Exercise   Blood Pressure (Admit)  124/62       Blood Pressure (Exercise)  128/64       Blood Pressure (Exit)  108/64       Heart Rate (Admit)  71 bpm       Heart Rate (Exercise)  95 bpm       Heart Rate (Exit)  71 bpm       Oxygen Saturation (Admit)  99 %       Oxygen Saturation (Exercise)  95 %       Rating of Perceived Exertion (Exercise)  12       Perceived Dyspnea (Exercise)  2       Symptoms  calves buring 9/10, SOB       Comments  walk test results          Exercise Comments:   Exercise Goals and Review: Exercise Goals    Row Name 08/29/18 1241             Exercise Goals   Increase Physical Activity  Yes       Intervention  Provide advice, education, support  and counseling about physical activity/exercise needs.;Develop an individualized exercise prescription for aerobic and resistive training based on initial evaluation findings, risk stratification, comorbidities and participant's personal goals.       Expected Outcomes  Short Term: Attend rehab on a regular basis to increase amount of physical activity.;Long Term: Add in home exercise to make exercise part of routine and to increase amount of physical activity.;Long Term: Exercising regularly at least 3-5 days a week.       Increase Strength and Stamina  Yes       Intervention  Provide advice,  education, support and counseling about physical activity/exercise needs.;Develop an individualized exercise prescription for aerobic and resistive training based on initial evaluation findings, risk stratification, comorbidities and participant's personal goals.       Expected Outcomes  Short Term: Increase workloads from initial exercise prescription for resistance, speed, and METs.;Short Term: Perform resistance training exercises routinely during rehab and add in resistance training at home;Long Term: Improve cardiorespiratory fitness, muscular endurance and strength as measured by increased METs and functional capacity (6MWT)       Able to understand and use rate of perceived exertion (RPE) scale  Yes       Intervention  Provide education and explanation on how to use RPE scale       Expected Outcomes  Short Term: Able to use RPE daily in rehab to express subjective intensity level;Long Term:  Able to use RPE to guide intensity level when exercising independently       Able to understand and use Dyspnea scale  Yes       Intervention  Provide education and explanation on how to use Dyspnea scale       Expected Outcomes  Short Term: Able to use Dyspnea scale daily in rehab to express subjective sense of shortness of breath during exertion;Long Term: Able to use Dyspnea scale to guide intensity level when exercising independently       Knowledge and understanding of Target Heart Rate Range (THRR)  Yes       Intervention  Provide education and explanation of THRR including how the numbers were predicted and where they are located for reference       Expected Outcomes  Short Term: Able to state/look up THRR;Short Term: Able to use daily as guideline for intensity in rehab;Long Term: Able to use THRR to govern intensity when exercising independently       Able to check pulse independently  Yes       Intervention  Provide education and demonstration on how to check pulse in carotid and radial  arteries.;Review the importance of being able to check your own pulse for safety during independent exercise       Expected Outcomes  Short Term: Able to explain why pulse checking is important during independent exercise;Long Term: Able to check pulse independently and accurately       Understanding of Exercise Prescription  Yes       Intervention  Provide education, explanation, and written materials on patient's individual exercise prescription       Expected Outcomes  Short Term: Able to explain program exercise prescription;Long Term: Able to explain home exercise prescription to exercise independently          Exercise Goals Re-Evaluation :   Discharge Exercise Prescription (Final Exercise Prescription Changes): Exercise Prescription Changes - 08/29/18 1200      Response to Exercise   Blood Pressure (Admit)  124/62    Blood Pressure (  Exercise)  128/64    Blood Pressure (Exit)  108/64    Heart Rate (Admit)  71 bpm    Heart Rate (Exercise)  95 bpm    Heart Rate (Exit)  71 bpm    Oxygen Saturation (Admit)  99 %    Oxygen Saturation (Exercise)  95 %    Rating of Perceived Exertion (Exercise)  12    Perceived Dyspnea (Exercise)  2    Symptoms  calves buring 9/10, SOB    Comments  walk test results       Nutrition:  Target Goals: Understanding of nutrition guidelines, daily intake of sodium <1567m, cholesterol <2028m calories 30% from fat and 7% or less from saturated fats, daily to have 5 or more servings of fruits and vegetables.  Biometrics: Pre Biometrics - 08/29/18 1242      Pre Biometrics   Height  6' (1.829 m)    Weight  172 lb 8 oz (78.2 kg)    BMI (Calculated)  23.39    Single Leg Stand  30 seconds        Nutrition Therapy Plan and Nutrition Goals: Nutrition Therapy & Goals - 08/29/18 1200      Nutrition Therapy   Diet  low Na, HH diet    Protein (specify units)  62g    Fiber  30 grams    Whole Grain Foods  3 servings    Saturated Fats  12 max. grams     Fruits and Vegetables  5 servings/day    Sodium  1.5 grams      Personal Nutrition Goals   Nutrition Goal  ST: when pt does not have breakfast, have mid-morning protein snack LT: gain energy,  reduce SOB    Comments  Pt reports eating soul food mostly. He reports loving vegetables, disliking bread, eating beans and chicken (mostly baked, but with skin- will sometimes have fried chicken), likes to use butter and th[employer] - will have salads a couple times a week, will have fish 1x/week, 1 egg 2-3x/week, B (eggs, cheese and sausage) a couple times a week. Pt will normall have just dinner around 3pm (if pt has snack too close to dinner will not eat) - discussed a protein rich mid-morning snack (discussed healthy options). Pt reports that he drinks mountain dew mostly during the day. Discussed HH eating.      Intervention Plan   Intervention  Prescribe, educate and counsel regarding individualized specific dietary modifications aiming towards targeted core components such as weight, hypertension, lipid management, diabetes, heart failure and other comorbidities.;Nutrition handout(s) given to patient.    Expected Outcomes  Short Term Goal: Understand basic principles of dietary content, such as calories, fat, sodium, cholesterol and nutrients.;Short Term Goal: A plan has been developed with personal nutrition goals set during dietitian appointment.;Long Term Goal: Adherence to prescribed nutrition plan.       Nutrition Assessments: Nutrition Assessments - 08/29/18 1200      MEDFICTS Scores   Pre Score  43       Nutrition Goals Re-Evaluation:   Nutrition Goals Discharge (Final Nutrition Goals Re-Evaluation):   Psychosocial: Target Goals: Acknowledge presence or absence of significant depression and/or stress, maximize coping skills, provide positive support system. Participant is able to verbalize types and ability to use techniques and skills needed for reducing stress and  depression.   Initial Review & Psychosocial Screening: Initial Psych Review & Screening - 08/25/18 1418      Initial Review  Current issues with  None Identified      Family Dynamics   Good Support System?  Yes   Wife and family     Barriers   Psychosocial barriers to participate in program  There are no identifiable barriers or psychosocial needs.;The patient should benefit from training in stress management and relaxation.      Screening Interventions   Interventions  Encouraged to exercise;To provide support and resources with identified psychosocial needs;Provide feedback about the scores to participant    Expected Outcomes  Short Term goal: Utilizing psychosocial counselor, staff and physician to assist with identification of specific Stressors or current issues interfering with healing process. Setting desired goal for each stressor or current issue identified.;Long Term Goal: Stressors or current issues are controlled or eliminated.;Short Term goal: Identification and review with participant of any Quality of Life or Depression concerns found by scoring the questionnaire.;Long Term goal: The participant improves quality of Life and PHQ9 Scores as seen by post scores and/or verbalization of changes       Quality of Life Scores:  Quality of Life - 08/29/18 1242      Quality of Life   Select  Quality of Life      Quality of Life Scores   Health/Function Pre  15.07 %    Socioeconomic Pre  20.36 %    Psych/Spiritual Pre  23.57 %    Family Pre  28.8 %    GLOBAL Pre  20.08 %      Scores of 19 and below usually indicate a poorer quality of life in these areas.  A difference of  2-3 points is a clinically meaningful difference.  A difference of 2-3 points in the total score of the Quality of Life Index has been associated with significant improvement in overall quality of life, self-image, physical symptoms, and general health in studies assessing change in quality of  life.  PHQ-9: Recent Review Flowsheet Data    Depression screen [facility] 2/9 08/29/2018   Decreased Interest 0   Down, Depressed, Hopeless 0   PHQ - 2 Score 0   Altered sleeping 0   Tired, decreased energy 3   Change in appetite 0   Feeling bad or failure about yourself  0   Trouble concentrating 0   Moving slowly or fidgety/restless 0   Suicidal thoughts 0   PHQ-9 Score 3   Difficult doing work/chores Not difficult at all     Interpretation of Total Score  Total Score Depression Severity:  1-4 = Minimal depression, 5-9 = Mild depression, 10-14 = Moderate depression, 15-19 = Moderately severe depression, 20-27 = Severe depression   Psychosocial Evaluation and Intervention: Psychosocial Evaluation - 08/25/18 1426      Psychosocial Evaluation & Interventions   Interventions  Encouraged to exercise with the program and follow exercise prescription    Comments  Peter Frey has no barriers to entering the program. He is eager to start the program so he can build up his stamina.  He stated that he has seen a psychiatrist this year  and reports he does not feel depressed. He states he is doing fine dealing with the fact he has heart disease and the severity of his disease.    Expected Outcomes  Peter Frey will continue to deal well with his heart disease.  He will be able to benefit from the program to help him feel stronger and more able to meet his daily routines.    Continue Psychosocial Services  Follow up required by staff       Psychosocial Re-Evaluation:   Psychosocial Discharge (Final Psychosocial Re-Evaluation):   Vocational Rehabilitation: Provide vocational rehab assistance to qualifying candidates.   Vocational Rehab Evaluation & Intervention: Vocational Rehab - 08/25/18 1421      Initial Vocational Rehab Evaluation & Intervention   Assessment shows need for Vocational Rehabilitation  No       Education: Education Goals: Education classes will be provided on a variety of topics  geared toward better understanding of heart health and risk factor modification. Participant will state understanding/return demonstration of topics presented as noted by education test scores.  Learning Barriers/Preferences: Learning Barriers/Preferences - 08/25/18 1421      Learning Barriers/Preferences   Learning Barriers  None    Learning Preferences  None       Education Topics:  AED/CPR: - Group verbal and written instruction with the use of models to demonstrate the basic use of the AED with the basic ABC's of resuscitation.   General Nutrition Guidelines/Fats and Fiber: -Group instruction provided by verbal, written material, models and posters to present the general guidelines for heart healthy nutrition. Gives an explanation and review of dietary fats and fiber.   Controlling Sodium/Reading Food Labels: -Group verbal and written material supporting the discussion of sodium use in heart healthy nutrition. Review and explanation with models, verbal and written materials for utilization of the food label.   Exercise Physiology & General Exercise Guidelines: - Group verbal and written instruction with models to review the exercise physiology of the cardiovascular system and associated critical values. Provides general exercise guidelines with specific guidelines to those with heart or lung disease.    Aerobic Exercise & Resistance Training: - Gives group verbal and written instruction on the various components of exercise. Focuses on aerobic and resistive training programs and the benefits of this training and how to safely progress through these programs..   Flexibility, Balance, Mind/Body Relaxation: Provides group verbal/written instruction on the benefits of flexibility and balance training, including mind/body exercise modes such as yoga, pilates and tai chi.  Demonstration and skill practice provided.   Stress and Anxiety: - Provides group verbal and written  instruction about the health risks of elevated stress and causes of high stress.  Discuss the correlation between heart/lung disease and anxiety and treatment options. Review healthy ways to manage with stress and anxiety.   Depression: - Provides group verbal and written instruction on the correlation between heart/lung disease and depressed mood, treatment options, and the stigmas associated with seeking treatment.   Anatomy & Physiology of the Heart: - Group verbal and written instruction and models provide basic cardiac anatomy and physiology, with the coronary electrical and arterial systems. Review of Valvular disease and Heart Failure   Cardiac Procedures: - Group verbal and written instruction to review commonly prescribed medications for heart disease. Reviews the medication, class of the drug, and side effects. Includes the steps to properly store meds and maintain the prescription regimen. (beta blockers and nitrates)   Cardiac Medications I: - Group verbal and written instruction to review commonly prescribed medications for heart disease. Reviews the medication, class of the drug, and side effects. Includes the steps to properly store meds and maintain the prescription regimen.   Cardiac Medications II: -Group verbal and written instruction to review commonly prescribed medications for heart disease. Reviews the medication, class of the drug, and side effects. (all other drug classes)    Go Sex-Intimacy & Heart Disease, Get  SMART - Goal Setting: - Group verbal and written instruction through game format to discuss heart disease and the return to sexual intimacy. Provides group verbal and written material to discuss and apply goal setting through the application of the S.M.A.R.T. Method.   Other Matters of the Heart: - Provides group verbal, written materials and models to describe Stable Angina and Peripheral Artery. Includes description of the disease process and treatment  options available to the cardiac patient.   Exercise & Equipment Safety: - Individual verbal instruction and demonstration of equipment use and safety with use of the equipment.   Cardiac Rehab from 08/29/2018 in [facility] Cardiac and Pulmonary Rehab  Date  08/29/18  Educator  [facility]  Instruction Review Code  1- Verbalizes Understanding      Infection Prevention: - Provides verbal and written material to individual with discussion of infection control including proper hand washing and proper equipment cleaning during exercise session.   Cardiac Rehab from 08/29/2018 in [facility] Cardiac and Pulmonary Rehab  Date  08/29/18  Educator  [facility]  Instruction Review Code  1- Verbalizes Understanding      Falls Prevention: - Provides verbal and written material to individual with discussion of falls prevention and safety.   Cardiac Rehab from 08/29/2018 in [facility] Cardiac and Pulmonary Rehab  Date  08/29/18  Educator  [facility]  Instruction Review Code  1- Verbalizes Understanding      Diabetes: - Individual verbal and written instruction to review signs/symptoms of diabetes, desired ranges of glucose level fasting, after meals and with exercise. Acknowledge that pre and post exercise glucose checks will be done for 3 sessions at entry of program.   Know Your Numbers and Risk Factors: -Group verbal and written instruction about important numbers in your health.  Discussion of what are risk factors and how they play a role in the disease process.  Review of Cholesterol, Blood Pressure, Diabetes, and BMI and the role they play in your overall health.   Sleep Hygiene: -Provides group verbal and written instruction about how sleep can affect your health.  Define sleep hygiene, discuss sleep cycles and impact of sleep habits. Review good sleep hygiene tips.    Other: -Provides group and verbal instruction on various topics (see comments)   Knowledge Questionnaire Score: Knowledge Questionnaire Score - 08/29/18  1243      Knowledge Questionnaire Score   Pre Score  20/26   Education Focus: Angina, Heart Failure, Nutrition, Exercise      Core Components/Risk Factors/Patient Goals at Admission: Personal Goals and Risk Factors at Admission - 08/29/18 1244      Core Components/Risk Factors/Patient Goals on Admission    Weight Management  Yes;Weight Maintenance    Intervention  Weight Management: Develop a combined nutrition and exercise program designed to reach desired caloric intake, while maintaining appropriate intake of nutrient and fiber, sodium and fats, and appropriate energy expenditure required for the weight goal.;Weight Management: Provide education and appropriate resources to help participant work on and attain dietary goals.    Admit Weight  172 lb 8 oz (78.2 kg)    Goal Weight: Short Term  172 lb (78 kg)    Goal Weight: Long Term  172 lb (78 kg)    Expected Outcomes  Short Term: Continue to assess and modify interventions until short term weight is achieved;Long Term: Adherence to nutrition and physical activity/exercise program aimed toward attainment of established weight goal;Weight Maintenance: Understanding of the daily nutrition guidelines, which includes 25-35% calories from fat,  7% or less cal from saturated fats, less than 240m cholesterol, less than 1.5gm of sodium, & 5 or more servings of fruits and vegetables daily    Intervention  Provide education about signs/symptoms and action to take for hypo/hyperglycemia.;Provide education about proper nutrition, including hydration, and aerobic/resistive exercise prescription along with prescribed medications to achieve blood glucose in normal ranges: Fasting glucose 65-99 mg/dL    Expected Outcomes  Short Term: Participant verbalizes understanding of the signs/symptoms and immediate care of hyper/hypoglycemia, proper foot care and importance of medication, aerobic/resistive exercise and nutrition plan for blood glucose control.;Long Term:  Attainment of HbA1C < 7%.    Heart Failure  Yes    Intervention  Provide a combined exercise and nutrition program that is supplemented with education, support and counseling about heart failure. Directed toward relieving symptoms such as shortness of breath, decreased exercise tolerance, and extremity edema.    Expected Outcomes  Improve functional capacity of life;Short term: Attendance in program 2-3 days a week with increased exercise capacity. Reported lower sodium intake. Reported increased fruit and vegetable intake. Reports medication compliance.;Short term: Daily weights obtained and reported for increase. Utilizing diuretic protocols set by physician.;Long term: Adoption of self-care skills and reduction of barriers for early signs and symptoms recognition and intervention leading to self-care maintenance.    Hypertension  Yes    Intervention  Provide education on lifestyle modifcations including regular physical activity/exercise, weight management, moderate sodium restriction and increased consumption of fresh fruit, vegetables, and low fat dairy, alcohol moderation, and smoking cessation.;Monitor prescription use compliance.    Expected Outcomes  Short Term: Continued assessment and intervention until BP is < 140/958mHG in hypertensive participants. < 130/8058mG in hypertensive participants with diabetes, heart failure or chronic kidney disease.;Long Term: Maintenance of blood pressure at goal levels.    Lipids  Yes    Intervention  Provide education and support for participant on nutrition & aerobic/resistive exercise along with prescribed medications to achieve LDL <80m53mDL >40mg72m Expected Outcomes  Short Term: Participant states understanding of desired cholesterol values and is compliant with medications prescribed. Participant is following exercise prescription and nutrition guidelines.;Long Term: Cholesterol controlled with medications as prescribed, with individualized exercise RX  and with personalized nutrition plan. Value goals: LDL < 80mg,28m > 40 mg.       Core Components/Risk Factors/Patient Goals Review:    Core Components/Risk Factors/Patient Goals at Discharge (Final Review):    ITP Comments: ITP Comments    Row Name 08/25/18 1430 08/29/18 1215         ITP Comments  nitial Orientation Virtual Call completed.   EP/RD appt is on 8/17 Diagnosis [facility]  Completed 6MWT, gym orientation, and RD evaluation. Initial ITP created and sent for review to Dr. Mark MEmily Filbertcal Director.         Comments: Initial ITP

## 2018-08-30 DIAGNOSIS — I255 Ischemic cardiomyopathy: Secondary | ICD-10-CM | POA: Diagnosis not present

## 2018-08-30 DIAGNOSIS — I429 Cardiomyopathy, unspecified: Secondary | ICD-10-CM

## 2018-08-30 NOTE — Progress Notes (Signed)
Daily Session Note  Patient Details  Name: ABDIKADIR FOHL MRN: [id number] Date of Birth: 1950/03/24 Referring Provider:     Cardiac Rehab from 08/29/2018 in [facility] Cardiac and Pulmonary Rehab  Referring Provider  Nicholes Mango MD      Encounter Date: 08/30/2018  Check In:      Social History   Tobacco Use  Smoking Status Former Smoker  . Packs/day: 2.00  . Years: 43.00  . Pack years: 86.00  . Types: Cigarettes  . Quit date: 2015  . Years since quitting: 5.6  Smokeless Tobacco Never Used    Goals Met:  Exercise tolerated well Personal goals reviewed No report of cardiac concerns or symptoms Strength training completed today  Goals Unmet:  Not Applicable  Comments: First full day of exercise!  Patient was oriented to gym and equipment including functions, settings, policies, and procedures.  Patient's individual exercise prescription and treatment plan were reviewed.  All starting workloads were established based on the results of the 6 minute walk test done at initial orientation visit.  The plan for exercise progression was also introduced and progression will be customized based on patient's performance and goals.    Dr. Emily Filbert is Medical Director for [place] and [facility].

## 2018-09-01 ENCOUNTER — Encounter: Payer: No Typology Code available for payment source | Admitting: *Deleted

## 2018-09-01 ENCOUNTER — Other Ambulatory Visit: Payer: Self-pay

## 2018-09-01 DIAGNOSIS — I255 Ischemic cardiomyopathy: Secondary | ICD-10-CM | POA: Diagnosis not present

## 2018-09-01 DIAGNOSIS — I429 Cardiomyopathy, unspecified: Secondary | ICD-10-CM

## 2018-09-01 NOTE — Progress Notes (Signed)
Daily Session Note  Patient Details  Name: Peter Frey MRN: [id number] Date of Birth: 1950/02/05 Referring Provider:     Cardiac Rehab from 08/29/2018 in [facility] Cardiac and Pulmonary Rehab  Referring Provider  Nicholes Mango MD      Encounter Date: 09/01/2018  Check In: Session Check In - 09/01/18 1015      Check-In   Supervising physician immediately available to respond to emergencies  See telemetry face sheet for immediately available ER MD    Location  [facility]-Cardiac & Pulmonary Rehab    Staff Present  Heath Lark, RN, BSN, CCRP;Jeanna Durrell BS, Exercise Physiologist;Amanda Oletta Darter, BA, ACSM CEP, Exercise Physiologist    Virtual Visit  No    Medication changes reported      No    Fall or balance concerns reported     No    Warm-up and Cool-down  Performed on first and last piece of equipment    Resistance Training Performed  Yes    VAD Patient?  No    PAD/SET Patient?  No      Pain Assessment   Currently in Pain?  No/denies          Social History   Tobacco Use  Smoking Status Former Smoker  . Packs/day: 2.00  . Years: 43.00  . Pack years: 86.00  . Types: Cigarettes  . Quit date: 2015  . Years since quitting: 5.6  Smokeless Tobacco Never Used    Goals Met:  Independence with exercise equipment Exercise tolerated well No report of cardiac concerns or symptoms Strength training completed today  Goals Unmet:  Not Applicable  Comments: Pt able to follow exercise prescription today without complaint.  Will continue to monitor for progression.    Dr. Emily Filbert is Medical Director for [place] and [facility].

## 2018-09-06 ENCOUNTER — Encounter: Payer: No Typology Code available for payment source | Admitting: *Deleted

## 2018-09-06 ENCOUNTER — Other Ambulatory Visit: Payer: Self-pay

## 2018-09-06 DIAGNOSIS — I429 Cardiomyopathy, unspecified: Secondary | ICD-10-CM

## 2018-09-06 DIAGNOSIS — I255 Ischemic cardiomyopathy: Secondary | ICD-10-CM | POA: Diagnosis not present

## 2018-09-06 NOTE — Progress Notes (Signed)
Daily Session Note  Patient Details  Name: Peter Frey MRN: [id number] Date of Birth: August 04, 1950 Referring Provider:     Cardiac Rehab from 08/29/2018 in [facility] Cardiac and Pulmonary Rehab  Referring Provider  Nicholes Mango MD      Encounter Date: 09/06/2018  Check In: Session Check In - 09/06/18 1023      Check-In   Supervising physician immediately available to respond to emergencies  See telemetry face sheet for immediately available ER MD    Location  [facility]-Cardiac & Pulmonary Rehab    Staff Present  Jasper Loser BS, Exercise Physiologist;Amanda Oletta Darter, BA, ACSM CEP, Exercise Physiologist;Amedee Cerrone, RN, BSN, CCRP    Virtual Visit  No    Medication changes reported      No    Fall or balance concerns reported     No    Warm-up and Cool-down  Performed on first and last piece of equipment    Resistance Training Performed  Yes    VAD Patient?  No    PAD/SET Patient?  No      Pain Assessment   Currently in Pain?  No/denies          Social History   Tobacco Use  Smoking Status Former Smoker  . Packs/day: 2.00  . Years: 43.00  . Pack years: 86.00  . Types: Cigarettes  . Quit date: 2015  . Years since quitting: 5.6  Smokeless Tobacco Never Used    Goals Met:  Independence with exercise equipment Exercise tolerated well No report of cardiac concerns or symptoms Strength training completed today  Goals Unmet:  Not Applicable  Comments: Pt able to follow exercise prescription today without complaint.  Will continue to monitor for progression.    Dr. Emily Filbert is Medical Director for [place] and [facility].

## 2018-09-08 ENCOUNTER — Other Ambulatory Visit: Payer: Self-pay

## 2018-09-08 DIAGNOSIS — I255 Ischemic cardiomyopathy: Secondary | ICD-10-CM | POA: Diagnosis not present

## 2018-09-08 DIAGNOSIS — I429 Cardiomyopathy, unspecified: Secondary | ICD-10-CM

## 2018-09-08 NOTE — Progress Notes (Signed)
Daily Session Note  Patient Details  Name: Peter Frey MRN: [id number] Date of Birth: [birth year] Referring Provider:     Cardiac Rehab from 08/29/2018 in [facility] Cardiac and Pulmonary Rehab  Referring Provider  Nicholes Mango MD      Encounter Date: 09/08/2018  Check In: Session Check In - 09/08/18 1021      Check-In   Supervising physician immediately available to respond to emergencies  See telemetry face sheet for immediately available ER MD    Location  [facility]-Cardiac & Pulmonary Rehab    Staff Present  Vida Rigger RN, Vickki Hearing, BA, ACSM CEP, Exercise Physiologist;Jeanna Durrell BS, Exercise Physiologist    Virtual Visit  No    Medication changes reported      No    Fall or balance concerns reported     No    Warm-up and Cool-down  Performed on first and last piece of equipment    Resistance Training Performed  Yes    VAD Patient?  No    PAD/SET Patient?  No      Pain Assessment   Currently in Pain?  No/denies    Multiple Pain Sites  No          Social History   Tobacco Use  Smoking Status Former Smoker  . Packs/day: 2.00  . Years: 43.00  . Pack years: 86.00  . Types: Cigarettes  . Quit date: 2015  . Years since quitting: 5.6  Smokeless Tobacco Never Used    Goals Met:  Independence with exercise equipment Exercise tolerated well No report of cardiac concerns or symptoms Strength training completed today  Goals Unmet:  Not Applicable  Comments: Pt able to follow exercise prescription today without complaint.  Will continue to monitor for progression.   Dr. Emily Filbert is Medical Director for [place] and [facility].

## 2018-09-09 ENCOUNTER — Other Ambulatory Visit: Payer: Self-pay

## 2018-09-09 DIAGNOSIS — Z20822 Contact with and (suspected) exposure to covid-19: Secondary | ICD-10-CM

## 2018-09-11 LAB — NOVEL CORONAVIRUS, NAA: SARS-CoV-2, NAA: NOT DETECTED

## 2018-09-13 ENCOUNTER — Encounter: Payer: Self-pay | Admitting: *Deleted

## 2018-09-13 ENCOUNTER — Telehealth: Payer: Self-pay | Admitting: *Deleted

## 2018-09-13 DIAGNOSIS — I429 Cardiomyopathy, unspecified: Secondary | ICD-10-CM

## 2018-09-13 NOTE — Telephone Encounter (Signed)
Returning call to pt. He called to leave [place] a message that he has cough and congestion.  They had a COVID test done and it was negative.  He is taking some medicine now to feel better.  Encouraged him to stay home until he is symptom free for 48 hours.

## 2018-09-21 ENCOUNTER — Encounter: Payer: Self-pay | Admitting: *Deleted

## 2018-09-21 DIAGNOSIS — I429 Cardiomyopathy, unspecified: Secondary | ICD-10-CM

## 2018-09-21 NOTE — Progress Notes (Signed)
Cardiac Individual Treatment Plan  Patient Details  Name: Peter Frey MRN: [id number] Date of Birth: [birth year] Referring Provider:     Cardiac Rehab from 08/29/2018 in [facility] Cardiac and Pulmonary Rehab  Referring Provider  Nicholes Mango MD      Initial Encounter Date:    Cardiac Rehab from 08/29/2018 in [facility] Cardiac and Pulmonary Rehab  Date  08/29/18      Visit Diagnosis: Cardiomyopathy, unspecified type ([place])  Patient's Home Medications on Admission:  Current Outpatient Medications:  .  albuterol (VENTOLIN HFA) 108 (90 Base) MCG/ACT inhaler, Inhale into the lungs., Disp: , Rfl:  .  aspirin 81 MG tablet, Take 81 mg by mouth daily., Disp: , Rfl:  .  furosemide (LASIX) 40 MG tablet, Take by mouth., Disp: , Rfl:  .  gabapentin (NEURONTIN) 300 MG capsule, Take 300 mg by mouth 3 (three) times daily., Disp: , Rfl:  .  losartan (COZAAR) 50 MG tablet, Take by mouth., Disp: , Rfl:  .  metoprolol (LOPRESSOR) 50 MG tablet, Take 50 mg by mouth 2 (two) times daily., Disp: , Rfl:  .  sildenafil (VIAGRA) 50 MG tablet, Take by mouth., Disp: , Rfl:  .  simvastatin (ZOCOR) 20 MG tablet, Take 20 mg by mouth daily., Disp: , Rfl:  .  spironolactone (ALDACTONE) 25 MG tablet, Take by mouth., Disp: , Rfl:  .  TIOTROPIUM BROMIDE-OLODATEROL IN, Inhale 2 Inhalers into the lungs daily., Disp: , Rfl:   Past Medical History: Past Medical History:  Diagnosis Date  . CHF (congestive heart failure) ([place])   . COPD (chronic obstructive pulmonary disease) ([place])   . Coronary artery disease   . MI (myocardial infarction) ([place])     Tobacco Use: Social History   Tobacco Use  Smoking Status Former Smoker  . Packs/day: 2.00  . Years: 43.00  . Pack years: 86.00  . Types: Cigarettes  . Quit date: 2015  . Years since quitting: 5.6  Smokeless Tobacco Never Used    Labs: Recent Review Flowsheet Data    There is no flowsheet data to display.       Exercise Target Goals: Exercise Program  Goal: Individual exercise prescription set using results from initial 6 min walk test and THRR while considering  patient's activity barriers and safety.   Exercise Prescription Goal: Initial exercise prescription builds to 30-45 minutes a day of aerobic activity, 2-3 days per week.  Home exercise guidelines will be given to patient during program as part of exercise prescription that the participant will acknowledge.  Activity Barriers & Risk Stratification: Activity Barriers & Cardiac Risk Stratification - 08/29/18 1216      Activity Barriers & Cardiac Risk Stratification   Activity Barriers  Shortness of Breath;Deconditioning;Muscular Weakness    Cardiac Risk Stratification  High       6 Minute Walk: 6 Minute Walk    Row Name 08/29/18 1216         6 Minute Walk   Phase  Initial     Distance  1270 feet     Walk Time  6 minutes     # of Rest Breaks  0     MPH  2.41     METS  3.35     RPE  12     Perceived Dyspnea   2     VO2 Peak  11.72     Symptoms  Yes (comment)     Comments  Calf pain 9/10, SOB  Resting HR  71 bpm     Resting BP  124/62     Resting Oxygen Saturation   99 %     Exercise Oxygen Saturation  during 6 min walk  95 %     Max Ex. HR  95 bpm     Max Ex. BP  128/64     2 Minute Post BP  108/64        Oxygen Initial Assessment:   Oxygen Re-Evaluation:   Oxygen Discharge (Final Oxygen Re-Evaluation):   Initial Exercise Prescription: Initial Exercise Prescription - 08/29/18 1200      Date of Initial Exercise RX and Referring Provider   Date  08/29/18    Referring Provider  Nicholes Mango MD      Treadmill   MPH  2.3    Grade  0.5    Minutes  15    METs  2.92      Recumbant Bike   Level  2    RPM  50    Watts  33    Minutes  15    METs  2.5      NuStep   Level  2    SPM  80    Minutes  15    METs  2.5      Recumbant Elliptical   Level  1    RPM  50    Minutes  15    METs  2.5      Prescription Details   Frequency (times per  week)  2    Duration  Progress to 30 minutes of continuous aerobic without signs/symptoms of physical distress      Intensity   THRR 40-80% of Max Heartrate  104-137    Ratings of Perceived Exertion  11-13    Perceived Dyspnea  0-4      Progression   Progression  Continue to progress workloads to maintain intensity without signs/symptoms of physical distress.      Resistance Training   Training Prescription  Yes    Weight  4 lbs    Reps  10-15       Perform Capillary Blood Glucose checks as needed.  Exercise Prescription Changes: Exercise Prescription Changes    Row Name 08/29/18 1200 09/02/18 1000 09/07/18 1100         Response to Exercise   Blood Pressure (Admit)  124/62  106/66  100/58     Blood Pressure (Exercise)  128/64  130/64  132/80     Blood Pressure (Exit)  108/64  110/70  132/70     Heart Rate (Admit)  71 bpm  80 bpm  69 bpm     Heart Rate (Exercise)  95 bpm  101 bpm  104 bpm     Heart Rate (Exit)  71 bpm  70 bpm  66 bpm     Oxygen Saturation (Admit)  99 %  -  -     Oxygen Saturation (Exercise)  95 %  -  -     Rating of Perceived Exertion (Exercise)  '12  12  14     '$ Perceived Dyspnea (Exercise)  2  -  -     Symptoms  calves buring 9/10, SOB  -  -     Comments  walk test results  -  -     Duration  -  Progress to 30 minutes of  aerobic without signs/symptoms of physical distress  Continue with 30 min of aerobic  exercise without signs/symptoms of physical distress.     Intensity  -  THRR unchanged  THRR unchanged       Progression   Progression  -  Continue to progress workloads to maintain intensity without signs/symptoms of physical distress.  Continue to progress workloads to maintain intensity without signs/symptoms of physical distress.     Average METs  -  2.4  2.9       Resistance Training   Training Prescription  -  Yes  Yes     Weight  -  4 lbs  4 lb     Reps  -  10-15  10-15       Interval Training   Interval Training  -  -  No       Treadmill    MPH  -  2  2     Grade  -  0  0     Minutes  -  15  15     METs  -  2.5  2.5       NuStep   Level  -  2  5     SPM  -  80  80     Minutes  -  15  15     METs  -  2.9  3.3       Recumbant Elliptical   Level  -  1  -     RPM  -  50  -     Minutes  -  15  -     METs  -  1.6  -        Exercise Comments: Exercise Comments    Row Name 08/30/18 1012           Exercise Comments  First full day of exercise!  Patient was oriented to gym and equipment including functions, settings, policies, and procedures.  Patient's individual exercise prescription and treatment plan were reviewed.  All starting workloads were established based on the results of the 6 minute walk test done at initial orientation visit.  The plan for exercise progression was also introduced and progression will be customized based on patient's performance and goals.          Exercise Goals and Review: Exercise Goals    Row Name 08/29/18 1241             Exercise Goals   Increase Physical Activity  Yes       Intervention  Provide advice, education, support and counseling about physical activity/exercise needs.;Develop an individualized exercise prescription for aerobic and resistive training based on initial evaluation findings, risk stratification, comorbidities and participant's personal goals.       Expected Outcomes  Short Term: Attend rehab on a regular basis to increase amount of physical activity.;Long Term: Add in home exercise to make exercise part of routine and to increase amount of physical activity.;Long Term: Exercising regularly at least 3-5 days a week.       Increase Strength and Stamina  Yes       Intervention  Provide advice, education, support and counseling about physical activity/exercise needs.;Develop an individualized exercise prescription for aerobic and resistive training based on initial evaluation findings, risk stratification, comorbidities and participant's personal goals.        Expected Outcomes  Short Term: Increase workloads from initial exercise prescription for resistance, speed, and METs.;Short Term: Perform resistance training exercises routinely during rehab and add in resistance training at home;Long  Term: Improve cardiorespiratory fitness, muscular endurance and strength as measured by increased METs and functional capacity (6MWT)       Able to understand and use rate of perceived exertion (RPE) scale  Yes       Intervention  Provide education and explanation on how to use RPE scale       Expected Outcomes  Short Term: Able to use RPE daily in rehab to express subjective intensity level;Long Term:  Able to use RPE to guide intensity level when exercising independently       Able to understand and use Dyspnea scale  Yes       Intervention  Provide education and explanation on how to use Dyspnea scale       Expected Outcomes  Short Term: Able to use Dyspnea scale daily in rehab to express subjective sense of shortness of breath during exertion;Long Term: Able to use Dyspnea scale to guide intensity level when exercising independently       Knowledge and understanding of Target Heart Rate Range (THRR)  Yes       Intervention  Provide education and explanation of THRR including how the numbers were predicted and where they are located for reference       Expected Outcomes  Short Term: Able to state/look up THRR;Short Term: Able to use daily as guideline for intensity in rehab;Long Term: Able to use THRR to govern intensity when exercising independently       Able to check pulse independently  Yes       Intervention  Provide education and demonstration on how to check pulse in carotid and radial arteries.;Review the importance of being able to check your own pulse for safety during independent exercise       Expected Outcomes  Short Term: Able to explain why pulse checking is important during independent exercise;Long Term: Able to check pulse independently and accurately        Understanding of Exercise Prescription  Yes       Intervention  Provide education, explanation, and written materials on patient's individual exercise prescription       Expected Outcomes  Short Term: Able to explain program exercise prescription;Long Term: Able to explain home exercise prescription to exercise independently          Exercise Goals Re-Evaluation : Exercise Goals Re-Evaluation    [place] Name 08/30/18 1012 09/02/18 1024 09/07/18 1111         Exercise Goal Re-Evaluation   Exercise Goals Review  Increase Physical Activity;Increase Strength and Stamina;Able to understand and use rate of perceived exertion (RPE) scale;Able to understand and use Dyspnea scale;Knowledge and understanding of Target Heart Rate Range (THRR);Able to check pulse independently;Understanding of Exercise Prescription  Increase Physical Activity;Increase Strength and Stamina;Able to understand and use rate of perceived exertion (RPE) scale;Knowledge and understanding of Target Heart Rate Range (THRR);Able to check pulse independently;Understanding of Exercise Prescription  Increase Physical Activity;Increase Strength and Stamina;Able to understand and use rate of perceived exertion (RPE) scale;Knowledge and understanding of Target Heart Rate Range (THRR);Able to check pulse independently;Understanding of Exercise Prescription     Comments  Reviewed RPE scale, THR and program prescription with pt today.  Pt voiced understanding and was given a copy of goals to take home.  [name] has done well in his first week of HT.  He has a very positive attitude and seems to enjoy exercise.  The TM has been most challenging  [name] has increased level on NS.  Staff  will continue to encourage progress.     Expected Outcomes  Short: Use RPE daily to regulate intensity. Long: Follow program prescription in THR.  Short - attend HT consistently Long - improve overall MET level  Short - increase TM level Long - improve overall MET level         Discharge Exercise Prescription (Final Exercise Prescription Changes): Exercise Prescription Changes - 09/07/18 1100      Response to Exercise   Blood Pressure (Admit)  100/58    Blood Pressure (Exercise)  132/80    Blood Pressure (Exit)  132/70    Heart Rate (Admit)  69 bpm    Heart Rate (Exercise)  104 bpm    Heart Rate (Exit)  66 bpm    Rating of Perceived Exertion (Exercise)  14    Duration  Continue with 30 min of aerobic exercise without signs/symptoms of physical distress.    Intensity  THRR unchanged      Progression   Progression  Continue to progress workloads to maintain intensity without signs/symptoms of physical distress.    Average METs  2.9      Resistance Training   Training Prescription  Yes    Weight  4 lb    Reps  10-15      Interval Training   Interval Training  No      Treadmill   MPH  2    Grade  0    Minutes  15    METs  2.5      NuStep   Level  5    SPM  80    Minutes  15    METs  3.3       Nutrition:  Target Goals: Understanding of nutrition guidelines, daily intake of sodium '1500mg'$ , cholesterol '200mg'$ , calories 30% from fat and 7% or less from saturated fats, daily to have 5 or more servings of fruits and vegetables.  Biometrics: Pre Biometrics - 08/29/18 1242      Pre Biometrics   Height  6' (1.829 m)    Weight  172 lb 8 oz (78.2 kg)    BMI (Calculated)  23.39    Single Leg Stand  30 seconds        Nutrition Therapy Plan and Nutrition Goals: Nutrition Therapy & Goals - 08/29/18 1200      Nutrition Therapy   Diet  low Na, HH diet    Protein (specify units)  62g    Fiber  30 grams    Whole Grain Foods  3 servings    Saturated Fats  12 max. grams    Fruits and Vegetables  5 servings/day    Sodium  1.5 grams      Personal Nutrition Goals   Nutrition Goal  ST: when pt does not have breakfast, have mid-morning protein snack LT: gain energy,  reduce SOB    Comments  Pt reports eating soul food mostly. He reports  loving vegetables, disliking bread, eating beans and chicken (mostly baked, but with skin- will sometimes have fried chicken), likes to use butter and [employer] dressing - will have salads a couple times a week, will have fish 1x/week, 1 egg 2-3x/week, B (eggs, cheese and sausage) a couple times a week. Pt will normall have just dinner around 3pm (if pt has snack too close to dinner will not eat) - discussed a protein rich mid-morning snack (discussed healthy options). Pt reports that he drinks mountain dew mostly during the day. Discussed  HH eating.      Intervention Plan   Intervention  Prescribe, educate and counsel regarding individualized specific dietary modifications aiming towards targeted core components such as weight, hypertension, lipid management, diabetes, heart failure and other comorbidities.;Nutrition handout(s) given to patient.    Expected Outcomes  Short Term Goal: Understand basic principles of dietary content, such as calories, fat, sodium, cholesterol and nutrients.;Short Term Goal: A plan has been developed with personal nutrition goals set during dietitian appointment.;Long Term Goal: Adherence to prescribed nutrition plan.       Nutrition Assessments: Nutrition Assessments - 08/29/18 1200      MEDFICTS Scores   Pre Score  43       Nutrition Goals Re-Evaluation:   Nutrition Goals Discharge (Final Nutrition Goals Re-Evaluation):   Psychosocial: Target Goals: Acknowledge presence or absence of significant depression and/or stress, maximize coping skills, provide positive support system. Participant is able to verbalize types and ability to use techniques and skills needed for reducing stress and depression.   Initial Review & Psychosocial Screening: Initial Psych Review & Screening - 08/25/18 1418      Initial Review   Current issues with  None Identified      Family Dynamics   Good Support System?  Yes   Wife and family     Barriers   Psychosocial  barriers to participate in program  There are no identifiable barriers or psychosocial needs.;The patient should benefit from training in stress management and relaxation.      Screening Interventions   Interventions  Encouraged to exercise;To provide support and resources with identified psychosocial needs;Provide feedback about the scores to participant    Expected Outcomes  Short Term goal: Utilizing psychosocial counselor, staff and physician to assist with identification of specific Stressors or current issues interfering with healing process. Setting desired goal for each stressor or current issue identified.;Long Term Goal: Stressors or current issues are controlled or eliminated.;Short Term goal: Identification and review with participant of any Quality of Life or Depression concerns found by scoring the questionnaire.;Long Term goal: The participant improves quality of Life and PHQ9 Scores as seen by post scores and/or verbalization of changes       Quality of Life Scores:  Quality of Life - 08/29/18 1242      Quality of Life   Select  Quality of Life      Quality of Life Scores   Health/Function Pre  15.07 %    Socioeconomic Pre  20.36 %    Psych/Spiritual Pre  23.57 %    Family Pre  28.8 %    GLOBAL Pre  20.08 %      Scores of 19 and below usually indicate a poorer quality of life in these areas.  A difference of  2-3 points is a clinically meaningful difference.  A difference of 2-3 points in the total score of the Quality of Life Index has been associated with significant improvement in overall quality of life, self-image, physical symptoms, and general health in studies assessing change in quality of life.  PHQ-9: Recent Review Flowsheet Data    Depression screen [facility] 2/9 08/29/2018   Decreased Interest 0   Down, Depressed, Hopeless 0   PHQ - 2 Score 0   Altered sleeping 0   Tired, decreased energy 3   Change in appetite 0   Feeling bad or failure about yourself  0    Trouble concentrating 0   Moving slowly or fidgety/restless 0   Suicidal thoughts  0   PHQ-9 Score 3   Difficult doing work/chores Not difficult at all     Interpretation of Total Score  Total Score Depression Severity:  1-4 = Minimal depression, 5-9 = Mild depression, 10-14 = Moderate depression, 15-19 = Moderately severe depression, 20-27 = Severe depression   Psychosocial Evaluation and Intervention: Psychosocial Evaluation - 08/25/18 1426      Psychosocial Evaluation & Interventions   Interventions  Encouraged to exercise with the program and follow exercise prescription    Comments  Octavion has no barriers to entering the program. He is eager to start the program so he can build up his stamina.  He stated that he has seen a psychiatrist this year  and reports he does not feel depressed. He states he is doing fine dealing with the fact he has heart disease and the severity of his disease.    Expected Outcomes  Chesky will continue to deal well with his heart disease.  He will be able to benefit from the program to help him feel stronger and more able to meet his daily routines.    Continue Psychosocial Services   Follow up required by staff       Psychosocial Re-Evaluation:   Psychosocial Discharge (Final Psychosocial Re-Evaluation):   Vocational Rehabilitation: Provide vocational rehab assistance to qualifying candidates.   Vocational Rehab Evaluation & Intervention: Vocational Rehab - 08/25/18 1421      Initial Vocational Rehab Evaluation & Intervention   Assessment shows need for Vocational Rehabilitation  No       Education: Education Goals: Education classes will be provided on a variety of topics geared toward better understanding of heart health and risk factor modification. Participant will state understanding/return demonstration of topics presented as noted by education test scores.  Learning Barriers/Preferences: Learning Barriers/Preferences - 08/25/18 1421       Learning Barriers/Preferences   Learning Barriers  None    Learning Preferences  None       Education Topics:  AED/CPR: - Group verbal and written instruction with the use of models to demonstrate the basic use of the AED with the basic ABC's of resuscitation.   General Nutrition Guidelines/Fats and Fiber: -Group instruction provided by verbal, written material, models and posters to present the general guidelines for heart healthy nutrition. Gives an explanation and review of dietary fats and fiber.   Controlling Sodium/Reading Food Labels: -Group verbal and written material supporting the discussion of sodium use in heart healthy nutrition. Review and explanation with models, verbal and written materials for utilization of the food label.   Exercise Physiology & General Exercise Guidelines: - Group verbal and written instruction with models to review the exercise physiology of the cardiovascular system and associated critical values. Provides general exercise guidelines with specific guidelines to those with heart or lung disease.    Aerobic Exercise & Resistance Training: - Gives group verbal and written instruction on the various components of exercise. Focuses on aerobic and resistive training programs and the benefits of this training and how to safely progress through these programs..   Flexibility, Balance, Mind/Body Relaxation: Provides group verbal/written instruction on the benefits of flexibility and balance training, including mind/body exercise modes such as yoga, pilates and tai chi.  Demonstration and skill practice provided.   Stress and Anxiety: - Provides group verbal and written instruction about the health risks of elevated stress and causes of high stress.  Discuss the correlation between heart/lung disease and anxiety and treatment options. Review healthy ways  to manage with stress and anxiety.   Depression: - Provides group verbal and written instruction  on the correlation between heart/lung disease and depressed mood, treatment options, and the stigmas associated with seeking treatment.   Anatomy & Physiology of the Heart: - Group verbal and written instruction and models provide basic cardiac anatomy and physiology, with the coronary electrical and arterial systems. Review of Valvular disease and Heart Failure   Cardiac Procedures: - Group verbal and written instruction to review commonly prescribed medications for heart disease. Reviews the medication, class of the drug, and side effects. Includes the steps to properly store meds and maintain the prescription regimen. (beta blockers and nitrates)   Cardiac Medications I: - Group verbal and written instruction to review commonly prescribed medications for heart disease. Reviews the medication, class of the drug, and side effects. Includes the steps to properly store meds and maintain the prescription regimen.   Cardiac Medications II: -Group verbal and written instruction to review commonly prescribed medications for heart disease. Reviews the medication, class of the drug, and side effects. (all other drug classes)    Go Sex-Intimacy & Heart Disease, Get SMART - Goal Setting: - Group verbal and written instruction through game format to discuss heart disease and the return to sexual intimacy. Provides group verbal and written material to discuss and apply goal setting through the application of the S.M.A.R.T. Method.   Other Matters of the Heart: - Provides group verbal, written materials and models to describe Stable Angina and Peripheral Artery. Includes description of the disease process and treatment options available to the cardiac patient.   Exercise & Equipment Safety: - Individual verbal instruction and demonstration of equipment use and safety with use of the equipment.   Cardiac Rehab from 08/29/2018 in [facility] Cardiac and Pulmonary Rehab  Date  08/29/18  Educator  [facility]   Instruction Review Code  1- Verbalizes Understanding      Infection Prevention: - Provides verbal and written material to individual with discussion of infection control including proper hand washing and proper equipment cleaning during exercise session.   Cardiac Rehab from 08/29/2018 in [facility] Cardiac and Pulmonary Rehab  Date  08/29/18  Educator  [facility]  Instruction Review Code  1- Verbalizes Understanding      Falls Prevention: - Provides verbal and written material to individual with discussion of falls prevention and safety.   Cardiac Rehab from 08/29/2018 in [facility] Cardiac and Pulmonary Rehab  Date  08/29/18  Educator  [facility]  Instruction Review Code  1- Verbalizes Understanding      Diabetes: - Individual verbal and written instruction to review signs/symptoms of diabetes, desired ranges of glucose level fasting, after meals and with exercise. Acknowledge that pre and post exercise glucose checks will be done for 3 sessions at entry of program.   Know Your Numbers and Risk Factors: -Group verbal and written instruction about important numbers in your health.  Discussion of what are risk factors and how they play a role in the disease process.  Review of Cholesterol, Blood Pressure, Diabetes, and BMI and the role they play in your overall health.   Sleep Hygiene: -Provides group verbal and written instruction about how sleep can affect your health.  Define sleep hygiene, discuss sleep cycles and impact of sleep habits. Review good sleep hygiene tips.    Other: -Provides group and verbal instruction on various topics (see comments)   Knowledge Questionnaire Score: Knowledge Questionnaire Score - 08/29/18 1243      Knowledge  Questionnaire Score   Pre Score  20/26   Education Focus: Angina, Heart Failure, Nutrition, Exercise      Core Components/Risk Factors/Patient Goals at Admission: Personal Goals and Risk Factors at Admission - 08/29/18 1244      Core Components/Risk  Factors/Patient Goals on Admission    Weight Management  Yes;Weight Maintenance    Intervention  Weight Management: Develop a combined nutrition and exercise program designed to reach desired caloric intake, while maintaining appropriate intake of nutrient and fiber, sodium and fats, and appropriate energy expenditure required for the weight goal.;Weight Management: Provide education and appropriate resources to help participant work on and attain dietary goals.    Admit Weight  172 lb 8 oz (78.2 kg)    Goal Weight: Short Term  172 lb (78 kg)    Goal Weight: Long Term  172 lb (78 kg)    Expected Outcomes  Short Term: Continue to assess and modify interventions until short term weight is achieved;Long Term: Adherence to nutrition and physical activity/exercise program aimed toward attainment of established weight goal;Weight Maintenance: Understanding of the daily nutrition guidelines, which includes 25-35% calories from fat, 7% or less cal from saturated fats, less than '200mg'$  cholesterol, less than 1.5gm of sodium, & 5 or more servings of fruits and vegetables daily    Intervention  Provide education about signs/symptoms and action to take for hypo/hyperglycemia.;Provide education about proper nutrition, including hydration, and aerobic/resistive exercise prescription along with prescribed medications to achieve blood glucose in normal ranges: Fasting glucose 65-99 mg/dL    Expected Outcomes  Short Term: Participant verbalizes understanding of the signs/symptoms and immediate care of hyper/hypoglycemia, proper foot care and importance of medication, aerobic/resistive exercise and nutrition plan for blood glucose control.;Long Term: Attainment of HbA1C < 7%.    Heart Failure  Yes    Intervention  Provide a combined exercise and nutrition program that is supplemented with education, support and counseling about heart failure. Directed toward relieving symptoms such as shortness of breath, decreased exercise  tolerance, and extremity edema.    Expected Outcomes  Improve functional capacity of life;Short term: Attendance in program 2-3 days a week with increased exercise capacity. Reported lower sodium intake. Reported increased fruit and vegetable intake. Reports medication compliance.;Short term: Daily weights obtained and reported for increase. Utilizing diuretic protocols set by physician.;Long term: Adoption of self-care skills and reduction of barriers for early signs and symptoms recognition and intervention leading to self-care maintenance.    Hypertension  Yes    Intervention  Provide education on lifestyle modifcations including regular physical activity/exercise, weight management, moderate sodium restriction and increased consumption of fresh fruit, vegetables, and low fat dairy, alcohol moderation, and smoking cessation.;Monitor prescription use compliance.    Expected Outcomes  Short Term: Continued assessment and intervention until BP is < 140/26m HG in hypertensive participants. < 130/846mHG in hypertensive participants with diabetes, heart failure or chronic kidney disease.;Long Term: Maintenance of blood pressure at goal levels.    Lipids  Yes    Intervention  Provide education and support for participant on nutrition & aerobic/resistive exercise along with prescribed medications to achieve LDL '70mg'$ , HDL >'40mg'$ .    Expected Outcomes  Short Term: Participant states understanding of desired cholesterol values and is compliant with medications prescribed. Participant is following exercise prescription and nutrition guidelines.;Long Term: Cholesterol controlled with medications as prescribed, with individualized exercise RX and with personalized nutrition plan. Value goals: LDL < '70mg'$ , HDL > 40 mg.  Core Components/Risk Factors/Patient Goals Review:    Core Components/Risk Factors/Patient Goals at Discharge (Final Review):    ITP Comments: ITP Comments    Row Name 08/25/18 1430  08/29/18 1215 09/13/18 1107 09/21/18 0600     ITP Comments  nitial Orientation Virtual Call completed.   EP/RD appt is on 8/17 Diagnosis documentation can be found in MEDIA tab [facility] records  Completed 6MWT, gym orientation, and RD evaluation. Initial ITP created and sent for review to Dr. Emily Filbert, Medical Director.  Returning call to pt. He called to leave [place] a message that he has cough and congestion.  They had a COVID test done and it was negative.  He is taking some medicine now to feel better.  Encouraged him to stay home until he is symptom free for 48 hours.  30 Day review. Continue with ITP unless directed changes per Medical Director review.  Still out recuperating from cough/congestion       Comments:

## 2018-09-22 ENCOUNTER — Encounter: Payer: No Typology Code available for payment source | Attending: Internal Medicine

## 2018-09-22 DIAGNOSIS — I255 Ischemic cardiomyopathy: Secondary | ICD-10-CM | POA: Insufficient documentation

## 2018-09-27 ENCOUNTER — Other Ambulatory Visit: Payer: Self-pay

## 2018-09-27 ENCOUNTER — Encounter: Payer: No Typology Code available for payment source | Admitting: *Deleted

## 2018-09-27 DIAGNOSIS — I255 Ischemic cardiomyopathy: Secondary | ICD-10-CM | POA: Diagnosis not present

## 2018-09-27 DIAGNOSIS — I429 Cardiomyopathy, unspecified: Secondary | ICD-10-CM

## 2018-09-27 NOTE — Progress Notes (Signed)
Daily Session Note  Patient Details  Name: Peter Frey MRN: [id number] Date of Birth: [birth year] Referring Provider:     Cardiac Rehab from 08/29/2018 in [facility] Cardiac and Pulmonary Rehab  Referring Provider  Nicholes Mango MD      Encounter Date: 09/27/2018  Check In: Session Check In - 09/27/18 1009      Check-In   Supervising physician immediately available to respond to emergencies  See telemetry face sheet for immediately available ER MD    Location  [facility]-Cardiac & Pulmonary Rehab    Staff Present  Heath Lark, RN, BSN, CCRP;Joseph Hood RCP,RRT,BSRT;Melissa [place] RDN, Rowe Pavy, BA, ACSM CEP, Exercise Physiologist    Virtual Visit  No    Medication changes reported      No    Fall or balance concerns reported     No    Warm-up and Cool-down  Performed on first and last piece of equipment    Resistance Training Performed  Yes    VAD Patient?  No    PAD/SET Patient?  No      Pain Assessment   Currently in Pain?  No/denies          Social History   Tobacco Use  Smoking Status Former Smoker  . Packs/day: 2.00  . Years: 43.00  . Pack years: 86.00  . Types: Cigarettes  . Quit date: 2015  . Years since quitting: 5.7  Smokeless Tobacco Never Used    Goals Met:  Independence with exercise equipment Exercise tolerated well No report of cardiac concerns or symptoms  Goals Unmet:  Not Applicable  Comments: Pt able to follow exercise prescription today without complaint.  Will continue to monitor for progression.    Dr. Emily Filbert is Medical Director for [facility] and [facility].

## 2018-09-29 ENCOUNTER — Other Ambulatory Visit: Payer: Self-pay

## 2018-09-29 DIAGNOSIS — I255 Ischemic cardiomyopathy: Secondary | ICD-10-CM | POA: Diagnosis not present

## 2018-09-29 DIAGNOSIS — I429 Cardiomyopathy, unspecified: Secondary | ICD-10-CM

## 2018-09-29 NOTE — Progress Notes (Signed)
Daily Session Note  Patient Details  Name: Peter Frey MRN: [id number] Date of Birth: 1950-03-22 Referring Provider:     Cardiac Rehab from 08/29/2018 in [facility] Cardiac and Pulmonary Rehab  Referring Provider  Nicholes Mango MD      Encounter Date: 09/29/2018  Check In: Session Check In - 09/29/18 1016      Check-In   Supervising physician immediately available to respond to emergencies  See telemetry face sheet for immediately available ER MD    Location  [facility]-Cardiac & Pulmonary Rehab    Staff Present  Vida Rigger RN, Vickki Hearing, BA, ACSM CEP, Exercise Physiologist;Jeanna Durrell BS, Exercise Physiologist    Virtual Visit  No    Medication changes reported      No    Fall or balance concerns reported     No    Warm-up and Cool-down  Performed on first and last piece of equipment    Resistance Training Performed  Yes    VAD Patient?  No    PAD/SET Patient?  No      Pain Assessment   Currently in Pain?  No/denies    Multiple Pain Sites  No          Social History   Tobacco Use  Smoking Status Former Smoker  . Packs/day: 2.00  . Years: 43.00  . Pack years: 86.00  . Types: Cigarettes  . Quit date: 2015  . Years since quitting: 5.7  Smokeless Tobacco Never Used    Goals Met:  Independence with exercise equipment Exercise tolerated well No report of cardiac concerns or symptoms Strength training completed today  Goals Unmet:  Not Applicable  Comments: Pt able to follow exercise prescription today without complaint.  Will continue to monitor for progression.    Dr. Emily Filbert is Medical Director for [place] and [facility].

## 2018-10-04 ENCOUNTER — Other Ambulatory Visit: Payer: Self-pay

## 2018-10-04 ENCOUNTER — Encounter: Payer: No Typology Code available for payment source | Admitting: *Deleted

## 2018-10-04 DIAGNOSIS — I255 Ischemic cardiomyopathy: Secondary | ICD-10-CM | POA: Diagnosis not present

## 2018-10-04 DIAGNOSIS — I429 Cardiomyopathy, unspecified: Secondary | ICD-10-CM

## 2018-10-04 NOTE — Progress Notes (Signed)
Daily Session Note  Patient Details  Name: Peter Frey MRN: [id number] Date of Birth: December 15, 1950 Referring Provider:     Cardiac Rehab from 08/29/2018 in [facility] Cardiac and Pulmonary Rehab  Referring Provider  Nicholes Mango MD      Encounter Date: 10/04/2018  Check In: Session Check In - 10/04/18 1020      Check-In   Supervising physician immediately available to respond to emergencies  See telemetry face sheet for immediately available ER MD    Location  [facility]-Cardiac & Pulmonary Rehab    Staff Present  Heath Lark, RN, BSN, CCRP;Amanda Sommer, BA, ACSM CEP, Exercise Physiologist;Joseph Hood RCP,RRT,BSRT    Virtual Visit  No    Medication changes reported      No    Fall or balance concerns reported     No    Warm-up and Cool-down  Performed on first and last piece of equipment    Resistance Training Performed  Yes    VAD Patient?  No    PAD/SET Patient?  No      Pain Assessment   Currently in Pain?  No/denies          Social History   Tobacco Use  Smoking Status Former Smoker  . Packs/day: 2.00  . Years: 43.00  . Pack years: 86.00  . Types: Cigarettes  . Quit date: 2015  . Years since quitting: 5.7  Smokeless Tobacco Never Used    Goals Met:  Independence with exercise equipment Exercise tolerated well No report of cardiac concerns or symptoms  Goals Unmet:  Not Applicable  Comments: Pt able to follow exercise prescription today without complaint.  Will continue to monitor for progression.    Dr. Emily Filbert is Medical Director for [place] and [facility].

## 2018-10-06 ENCOUNTER — Other Ambulatory Visit: Payer: Self-pay

## 2018-10-06 ENCOUNTER — Encounter: Payer: No Typology Code available for payment source | Admitting: *Deleted

## 2018-10-06 DIAGNOSIS — I255 Ischemic cardiomyopathy: Secondary | ICD-10-CM | POA: Diagnosis not present

## 2018-10-06 DIAGNOSIS — I429 Cardiomyopathy, unspecified: Secondary | ICD-10-CM

## 2018-10-06 NOTE — Progress Notes (Signed)
Daily Session Note  Patient Details  Name: Peter Frey MRN: [id number] Date of Birth: 07/08/1950 Referring Provider:     Cardiac Rehab from 08/29/2018 in [facility] Cardiac and Pulmonary Rehab  Referring Provider  Patel, Sonal MD      Encounter Date: 10/06/2018  Check In: Session Check In - 10/06/18 1009      Check-In   Supervising physician immediately available to respond to emergencies  See telemetry face sheet for immediately available ER MD    Location  [facility]-Cardiac & Pulmonary Rehab    Staff Present  Carroll Enterkin, RN, BSN-BC, CCRP;Amanda Sommer, BA, ACSM CEP, Exercise Physiologist;Jeanna Durrell BS, Exercise Physiologist;Jessica Hawkins, MA, RCEP, CCRP, CCET    Virtual Visit  No    Medication changes reported      No    Fall or balance concerns reported     No    Warm-up and Cool-down  Performed on first and last piece of equipment    Resistance Training Performed  Yes    VAD Patient?  No    PAD/SET Patient?  No      Pain Assessment   Currently in Pain?  No/denies          Social History   Tobacco Use  Smoking Status Former Smoker  . Packs/day: 2.00  . Years: 43.00  . Pack years: 86.00  . Types: Cigarettes  . Quit date: 2015  . Years since quitting: 5.7  Smokeless Tobacco Never Used    Goals Met:  Independence with exercise equipment Exercise tolerated well Personal goals reviewed No report of cardiac concerns or symptoms Strength training completed today  Goals Unmet:  Not Applicable  Comments: Pt able to follow exercise prescription today without complaint.  Will continue to monitor for progression. Reviewed home exercise with pt today.  Pt plans to walk for exercise.  Reviewed THR, pulse, RPE, sign and symptoms, NTG use, and when to call 911 or MD.  Also discussed weather considerations and indoor options.  Pt voiced understanding.     Dr. Mark Miller is Medical Director for [facility] and [facility]. 

## 2018-10-13 ENCOUNTER — Encounter: Payer: No Typology Code available for payment source | Attending: Internal Medicine

## 2018-10-13 DIAGNOSIS — I255 Ischemic cardiomyopathy: Secondary | ICD-10-CM | POA: Insufficient documentation

## 2018-10-18 ENCOUNTER — Inpatient Hospital Stay
Admission: EM | Admit: 2018-10-18 | Discharge: 2018-10-20 | DRG: 287 | Disposition: A | Discharge status: Home / Self Care | Payer: No Typology Code available for payment source | Attending: Internal Medicine | Admitting: Internal Medicine

## 2018-10-18 ENCOUNTER — Inpatient Hospital Stay (HOSPITAL_COMMUNITY)
Admit: 2018-10-18 | Discharge: 2018-10-18 | Disposition: A | Discharge status: Home / Self Care | Payer: No Typology Code available for payment source | Attending: Internal Medicine | Admitting: Internal Medicine

## 2018-10-18 ENCOUNTER — Other Ambulatory Visit: Payer: Self-pay

## 2018-10-18 ENCOUNTER — Encounter
Admission: EM | Disposition: A | Discharge status: Home / Self Care | Payer: Self-pay | Source: Home / Self Care | Attending: Specialist

## 2018-10-18 ENCOUNTER — Emergency Department: Payer: No Typology Code available for payment source

## 2018-10-18 ENCOUNTER — Encounter: Payer: Self-pay | Admitting: Emergency Medicine

## 2018-10-18 DIAGNOSIS — E785 Hyperlipidemia, unspecified: Secondary | ICD-10-CM | POA: Diagnosis present

## 2018-10-18 DIAGNOSIS — I959 Hypotension, unspecified: Secondary | ICD-10-CM | POA: Diagnosis not present

## 2018-10-18 DIAGNOSIS — Z8249 Family history of ischemic heart disease and other diseases of the circulatory system: Secondary | ICD-10-CM

## 2018-10-18 DIAGNOSIS — N183 Chronic kidney disease, stage 3 unspecified: Secondary | ICD-10-CM | POA: Diagnosis present

## 2018-10-18 DIAGNOSIS — I25718 Atherosclerosis of autologous vein coronary artery bypass graft(s) with other forms of angina pectoris: Secondary | ICD-10-CM | POA: Diagnosis present

## 2018-10-18 DIAGNOSIS — I493 Ventricular premature depolarization: Secondary | ICD-10-CM | POA: Diagnosis present

## 2018-10-18 DIAGNOSIS — I130 Hypertensive heart and chronic kidney disease with heart failure and stage 1 through stage 4 chronic kidney disease, or unspecified chronic kidney disease: Secondary | ICD-10-CM | POA: Diagnosis present

## 2018-10-18 DIAGNOSIS — I255 Ischemic cardiomyopathy: Secondary | ICD-10-CM | POA: Diagnosis present

## 2018-10-18 DIAGNOSIS — D509 Iron deficiency anemia, unspecified: Secondary | ICD-10-CM | POA: Diagnosis present

## 2018-10-18 DIAGNOSIS — Z95810 Presence of automatic (implantable) cardiac defibrillator: Secondary | ICD-10-CM

## 2018-10-18 DIAGNOSIS — Z87891 Personal history of nicotine dependence: Secondary | ICD-10-CM | POA: Diagnosis not present

## 2018-10-18 DIAGNOSIS — Z20828 Contact with and (suspected) exposure to other viral communicable diseases: Secondary | ICD-10-CM | POA: Diagnosis present

## 2018-10-18 DIAGNOSIS — Z955 Presence of coronary angioplasty implant and graft: Secondary | ICD-10-CM

## 2018-10-18 DIAGNOSIS — I25118 Atherosclerotic heart disease of native coronary artery with other forms of angina pectoris: Secondary | ICD-10-CM | POA: Diagnosis present

## 2018-10-18 DIAGNOSIS — D696 Thrombocytopenia, unspecified: Secondary | ICD-10-CM | POA: Diagnosis present

## 2018-10-18 DIAGNOSIS — Z79899 Other long term (current) drug therapy: Secondary | ICD-10-CM

## 2018-10-18 DIAGNOSIS — I472 Ventricular tachycardia, unspecified: Secondary | ICD-10-CM

## 2018-10-18 DIAGNOSIS — I5022 Chronic systolic (congestive) heart failure: Secondary | ICD-10-CM | POA: Diagnosis present

## 2018-10-18 DIAGNOSIS — J449 Chronic obstructive pulmonary disease, unspecified: Secondary | ICD-10-CM | POA: Diagnosis present

## 2018-10-18 DIAGNOSIS — T82118S Breakdown (mechanical) of other cardiac electronic device, sequela: Secondary | ICD-10-CM | POA: Diagnosis not present

## 2018-10-18 DIAGNOSIS — R4182 Altered mental status, unspecified: Secondary | ICD-10-CM | POA: Diagnosis present

## 2018-10-18 DIAGNOSIS — I361 Nonrheumatic tricuspid (valve) insufficiency: Secondary | ICD-10-CM | POA: Diagnosis not present

## 2018-10-18 DIAGNOSIS — Z888 Allergy status to other drugs, medicaments and biological substances status: Secondary | ICD-10-CM | POA: Diagnosis not present

## 2018-10-18 DIAGNOSIS — Z951 Presence of aortocoronary bypass graft: Secondary | ICD-10-CM | POA: Diagnosis not present

## 2018-10-18 DIAGNOSIS — Z8619 Personal history of other infectious and parasitic diseases: Secondary | ICD-10-CM

## 2018-10-18 DIAGNOSIS — Z7982 Long term (current) use of aspirin: Secondary | ICD-10-CM | POA: Diagnosis not present

## 2018-10-18 DIAGNOSIS — I2510 Atherosclerotic heart disease of native coronary artery without angina pectoris: Secondary | ICD-10-CM

## 2018-10-18 DIAGNOSIS — G629 Polyneuropathy, unspecified: Secondary | ICD-10-CM | POA: Diagnosis present

## 2018-10-18 DIAGNOSIS — I252 Old myocardial infarction: Secondary | ICD-10-CM

## 2018-10-18 HISTORY — PX: CORONARY/GRAFT ACUTE MI REVASCULARIZATION: CATH118305

## 2018-10-18 HISTORY — PX: LEFT HEART CATH AND CORS/GRAFTS ANGIOGRAPHY: CATH118250

## 2018-10-18 HISTORY — PX: CORONARY PRESSURE/FFR STUDY: CATH118243

## 2018-10-18 LAB — COMPREHENSIVE METABOLIC PANEL
ALT: 18 U/L (ref 0–44)
AST: 25 U/L (ref 15–41)
Albumin: 4 g/dL (ref 3.5–5.0)
Alkaline Phosphatase: 23 U/L — ABNORMAL LOW (ref 38–126)
Anion gap: 13 (ref 5–15)
BUN: 19 mg/dL (ref 8–23)
CO2: 22 mmol/L (ref 22–32)
Calcium: 9.1 mg/dL (ref 8.9–10.3)
Chloride: 101 mmol/L (ref 98–111)
Creatinine, Ser: 1.74 mg/dL — ABNORMAL HIGH (ref 0.61–1.24)
GFR calc Af Amer: 46 mL/min — ABNORMAL LOW (ref >60)
GFR calc non Af Amer: 40 mL/min — ABNORMAL LOW (ref >60)
Glucose, Bld: 120 mg/dL — ABNORMAL HIGH (ref 70–99)
Potassium: 3.7 mmol/L (ref 3.5–5.1)
Sodium: 136 mmol/L (ref 135–145)
Total Bilirubin: 0.6 mg/dL (ref 0.3–1.2)
Total Protein: 7.5 g/dL (ref 6.5–8.1)

## 2018-10-18 LAB — CBC WITH DIFFERENTIAL/PLATELET
Abs Immature Granulocytes: 0.01 10*3/uL (ref 0.00–0.07)
Basophils Absolute: 0 10*3/uL (ref 0.0–0.1)
Basophils Relative: 1 %
Eosinophils Absolute: 0.1 10*3/uL (ref 0.0–0.5)
Eosinophils Relative: 1 %
HCT: 32.3 % — ABNORMAL LOW (ref 39.0–52.0)
Hemoglobin: 10.6 g/dL — ABNORMAL LOW (ref 13.0–17.0)
Immature Granulocytes: 0 %
Lymphocytes Relative: 54 %
Lymphs Abs: 2.7 10*3/uL (ref 0.7–4.0)
MCH: 23.9 pg — ABNORMAL LOW (ref 26.0–34.0)
MCHC: 32.8 g/dL (ref 30.0–36.0)
MCV: 72.7 fL — ABNORMAL LOW (ref 80.0–100.0)
Monocytes Absolute: 0.5 10*3/uL (ref 0.1–1.0)
Monocytes Relative: 10 %
Neutro Abs: 1.7 10*3/uL (ref 1.7–7.7)
Neutrophils Relative %: 34 %
Platelets: 152 10*3/uL (ref 150–400)
RBC: 4.44 MIL/uL (ref 4.22–5.81)
RDW: 15.5 % (ref 11.5–15.5)
WBC: 5 10*3/uL (ref 4.0–10.5)
nRBC: 0 % (ref 0.0–0.2)

## 2018-10-18 LAB — LIPID PANEL
Cholesterol: 94 mg/dL (ref 0–200)
HDL: 50 mg/dL (ref >40)
LDL Cholesterol: 29 mg/dL (ref 0–99)
Total CHOL/HDL Ratio: 1.9 RATIO
Triglycerides: 73 mg/dL (ref <150)
VLDL: 15 mg/dL (ref 0–40)

## 2018-10-18 LAB — TROPONIN I (HIGH SENSITIVITY)
Troponin I (High Sensitivity): 10 ng/L (ref <18)
Troponin I (High Sensitivity): 54 ng/L — ABNORMAL HIGH (ref <18)

## 2018-10-18 LAB — SARS CORONAVIRUS 2 BY RT PCR (HOSPITAL ORDER, PERFORMED IN ~~LOC~~ HOSPITAL LAB): SARS Coronavirus 2: NEGATIVE

## 2018-10-18 LAB — APTT: aPTT: 33 seconds (ref 24–36)

## 2018-10-18 LAB — MRSA PCR SCREENING: MRSA by PCR: NEGATIVE

## 2018-10-18 LAB — POCT ACTIVATED CLOTTING TIME: Activated Clotting Time: 274 seconds

## 2018-10-18 LAB — PROTIME-INR
INR: 1 (ref 0.8–1.2)
Prothrombin Time: 13.4 seconds (ref 11.4–15.2)

## 2018-10-18 LAB — GLUCOSE, CAPILLARY: Glucose-Capillary: 89 mg/dL (ref 70–99)

## 2018-10-18 SURGERY — CORONARY/GRAFT ACUTE MI REVASCULARIZATION
Anesthesia: Moderate Sedation

## 2018-10-18 MED ORDER — ASPIRIN 81 MG PO CHEW: 324.0000 mg | CHEWABLE_TABLET | Freq: Once | ORAL | Status: DC

## 2018-10-18 MED ORDER — ALBUTEROL SULFATE (2.5 MG/3ML) 0.083% IN NEBU: 2.5000 mg | INHALATION_SOLUTION | RESPIRATORY_TRACT | Status: DC | PRN

## 2018-10-18 MED ORDER — GABAPENTIN 300 MG PO CAPS
300.0000 mg | ORAL_CAPSULE | Freq: Three times a day (TID) | ORAL | Status: DC
  Administered 2018-10-18 – 2018-10-20 (×5): 300 mg via ORAL
  Filled 2018-10-18 (×5): qty 1

## 2018-10-18 MED ORDER — HEPARIN SODIUM (PORCINE) 1000 UNIT/ML IJ SOLN
INTRAMUSCULAR | Status: DC | PRN
  Administered 2018-10-18 (×2): 4000 [IU] via INTRAVENOUS

## 2018-10-18 MED ORDER — HEPARIN SODIUM (PORCINE) 5000 UNIT/ML IJ SOLN
5000.0000 [IU] | Freq: Three times a day (TID) | INTRAMUSCULAR | Status: DC
  Administered 2018-10-19 – 2018-10-20 (×4): 5000 [IU] via SUBCUTANEOUS
  Filled 2018-10-18 (×4): qty 1

## 2018-10-18 MED ORDER — SODIUM CHLORIDE 0.9% FLUSH: 3.0000 mL | INTRAVENOUS | Status: DC | PRN

## 2018-10-18 MED ORDER — ASPIRIN 81 MG PO CHEW
CHEWABLE_TABLET | ORAL | Status: DC | PRN
  Administered 2018-10-18: 324 mg via ORAL

## 2018-10-18 MED ORDER — FENTANYL CITRATE (PF) 100 MCG/2ML IJ SOLN
INTRAMUSCULAR | Status: AC
  Filled 2018-10-18: qty 2

## 2018-10-18 MED ORDER — HEPARIN SODIUM (PORCINE) 1000 UNIT/ML IJ SOLN
INTRAMUSCULAR | Status: AC
  Filled 2018-10-18: qty 1

## 2018-10-18 MED ORDER — HEPARIN (PORCINE) IN NACL 1000-0.9 UT/500ML-% IV SOLN
INTRAVENOUS | Status: AC
  Filled 2018-10-18: qty 1000

## 2018-10-18 MED ORDER — SIMVASTATIN 20 MG PO TABS
20.0000 mg | ORAL_TABLET | Freq: Every day | ORAL | Status: DC
  Administered 2018-10-18 – 2018-10-19 (×2): 20 mg via ORAL
  Filled 2018-10-18 (×2): qty 1

## 2018-10-18 MED ORDER — NITROGLYCERIN 1 MG/10 ML FOR IR/CATH LAB
INTRA_ARTERIAL | Status: AC
  Filled 2018-10-18: qty 10

## 2018-10-18 MED ORDER — VERAPAMIL HCL 2.5 MG/ML IV SOLN
INTRAVENOUS | Status: AC
  Filled 2018-10-18: qty 2

## 2018-10-18 MED ORDER — MIDAZOLAM HCL 2 MG/2ML IJ SOLN
INTRAMUSCULAR | Status: AC
  Filled 2018-10-18: qty 2

## 2018-10-18 MED ORDER — CHLORHEXIDINE GLUCONATE CLOTH 2 % EX PADS
6.0000 | MEDICATED_PAD | Freq: Every day | CUTANEOUS | Status: DC
  Administered 2018-10-18: 18:00:00 6 via TOPICAL

## 2018-10-18 MED ORDER — ONDANSETRON HCL 4 MG/2ML IJ SOLN
INTRAMUSCULAR | Status: AC
  Filled 2018-10-18: qty 2

## 2018-10-18 MED ORDER — HYDRALAZINE HCL 20 MG/ML IJ SOLN: 10.0000 mg | INTRAMUSCULAR | Status: AC | PRN

## 2018-10-18 MED ORDER — AMIODARONE HCL IN DEXTROSE 360-4.14 MG/200ML-% IV SOLN
60.0000 mg/h | INTRAVENOUS | Status: AC
  Administered 2018-10-18: 19:00:00 60 mg/h via INTRAVENOUS
  Filled 2018-10-18: qty 200

## 2018-10-18 MED ORDER — SODIUM CHLORIDE 0.9 % IV SOLN
INTRAVENOUS | Status: AC | PRN
  Administered 2018-10-18: 250 mL
  Administered 2018-10-18: 20 mL/h via INTRAVENOUS

## 2018-10-18 MED ORDER — LABETALOL HCL 5 MG/ML IV SOLN: 10.0000 mg | INTRAVENOUS | Status: AC | PRN

## 2018-10-18 MED ORDER — TICAGRELOR 90 MG PO TABS
ORAL_TABLET | ORAL | Status: AC
  Filled 2018-10-18: qty 2

## 2018-10-18 MED ORDER — SODIUM CHLORIDE 0.9% FLUSH
3.0000 mL | Freq: Two times a day (BID) | INTRAVENOUS | Status: DC
  Administered 2018-10-18 – 2018-10-19 (×3): 3 mL via INTRAVENOUS

## 2018-10-18 MED ORDER — ASPIRIN EC 81 MG PO TBEC
81.0000 mg | DELAYED_RELEASE_TABLET | Freq: Every day | ORAL | Status: DC
  Administered 2018-10-19 – 2018-10-20 (×2): 81 mg via ORAL
  Filled 2018-10-18 (×2): qty 1

## 2018-10-18 MED ORDER — VERAPAMIL HCL 2.5 MG/ML IV SOLN
INTRAVENOUS | Status: DC | PRN
  Administered 2018-10-18 (×2): 2.5 mg via INTRA_ARTERIAL

## 2018-10-18 MED ORDER — ARFORMOTEROL TARTRATE 15 MCG/2ML IN NEBU
15.0000 ug | INHALATION_SOLUTION | Freq: Two times a day (BID) | RESPIRATORY_TRACT | Status: DC
  Administered 2018-10-19 – 2018-10-20 (×2): 15 ug via RESPIRATORY_TRACT
  Filled 2018-10-18 (×4): qty 2

## 2018-10-18 MED ORDER — IOHEXOL 300 MG/ML  SOLN
INTRAMUSCULAR | Status: DC | PRN
  Administered 2018-10-18: 120 mL via INTRA_ARTERIAL

## 2018-10-18 MED ORDER — TIOTROPIUM BROMIDE-OLODATEROL 2.5-2.5 MCG/ACT IN AERS: 2.0000 | INHALATION_SPRAY | Freq: Every day | RESPIRATORY_TRACT | Status: DC

## 2018-10-18 MED ORDER — METOPROLOL SUCCINATE ER 25 MG PO TB24
25.0000 mg | ORAL_TABLET | Freq: Every day | ORAL | Status: DC
  Administered 2018-10-18 – 2018-10-19 (×2): 25 mg via ORAL
  Filled 2018-10-18 (×2): qty 1

## 2018-10-18 MED ORDER — ASPIRIN 81 MG PO CHEW
CHEWABLE_TABLET | ORAL | Status: AC
  Filled 2018-10-18: qty 4

## 2018-10-18 MED ORDER — UMECLIDINIUM BROMIDE 62.5 MCG/INH IN AEPB
1.0000 | INHALATION_SPRAY | Freq: Every day | RESPIRATORY_TRACT | Status: DC
  Administered 2018-10-19 – 2018-10-20 (×2): 1 via RESPIRATORY_TRACT
  Filled 2018-10-18: qty 7

## 2018-10-18 MED ORDER — ONDANSETRON HCL 4 MG/2ML IJ SOLN: 4.0000 mg | Freq: Four times a day (QID) | INTRAMUSCULAR | Status: DC | PRN

## 2018-10-18 MED ORDER — SPIRONOLACTONE 25 MG PO TABS
25.0000 mg | ORAL_TABLET | Freq: Every day | ORAL | Status: DC
  Administered 2018-10-19 – 2018-10-20 (×2): 25 mg via ORAL
  Filled 2018-10-18 (×2): qty 1

## 2018-10-18 MED ORDER — AMIODARONE HCL IN DEXTROSE 360-4.14 MG/200ML-% IV SOLN
30.0000 mg/h | INTRAVENOUS | Status: DC
  Administered 2018-10-18 – 2018-10-19 (×2): 30 mg/h via INTRAVENOUS
  Filled 2018-10-18 (×2): qty 200

## 2018-10-18 MED ORDER — SODIUM CHLORIDE 0.9 % IV SOLN: 250.0000 mL | INTRAVENOUS | Status: DC | PRN

## 2018-10-18 MED ORDER — NITROGLYCERIN 1 MG/10 ML FOR IR/CATH LAB
INTRA_ARTERIAL | Status: DC | PRN
  Administered 2018-10-18: 100 ug via INTRACORONARY

## 2018-10-18 MED ORDER — ACETAMINOPHEN 325 MG PO TABS: 650.0000 mg | ORAL_TABLET | ORAL | Status: DC | PRN

## 2018-10-18 MED ORDER — SODIUM CHLORIDE 0.9 % IV SOLN
INTRAVENOUS | Status: AC
  Administered 2018-10-18: 19:00:00 via INTRAVENOUS

## 2018-10-18 MED ORDER — SODIUM CHLORIDE 0.9 % IV SOLN: INTRAVENOUS | Status: DC

## 2018-10-18 SURGICAL SUPPLY — 13 items
CATH INFINITI 5 FR MPA2 (CATHETERS) ×2 IMPLANT
CATH INFINITI 5FR JL4 (CATHETERS) ×2 IMPLANT
CATH INFINITI JR4 5F (CATHETERS) ×2 IMPLANT
CATH VISTA GUIDE 6FR XBLAD3.5 (CATHETERS) ×2 IMPLANT
DEVICE INFLAT 30 PLUS (MISCELLANEOUS) ×2 IMPLANT
DEVICE RAD TR BAND REGULAR (VASCULAR PRODUCTS) ×2 IMPLANT
GLIDESHEATH SLEND SS 6F .021 (SHEATH) ×2 IMPLANT
KIT MANI 3VAL PERCEP (MISCELLANEOUS) ×3 IMPLANT
PACK CARDIAC CATH (CUSTOM PROCEDURE TRAY) ×3 IMPLANT
WIRE HITORQ VERSACORE ST 145CM (WIRE) ×2 IMPLANT
WIRE PRESSURE VERRATA (WIRE) ×2 IMPLANT
WIRE ROSEN-J .035X260CM (WIRE) ×2 IMPLANT
WIRE RUNTHROUGH .014X180CM (WIRE) ×2 IMPLANT

## 2018-10-18 NOTE — Consult Note (Signed)
Cardiology Consultation:   Patient ID: Peter Frey MRN: [id number]; DOB: 02-27-1950  Admit date: 10/18/2018 Date of Consult: 10/18/2018  Primary [facility] Provider: [facility] Primary Cardiologist: [facility] Primary Electrophysiologist:  None    Patient Profile:   Peter Frey is a 68 y.o. male with a hx of CAD s/p PCI and 3-vessel CABG (9563), chronic systolic HF due to ischemic cardiomyopathy, CKD stage III, hypertension, COPD, prediabetes, and HCV s/p treatment,  who is being seen today for the evaluation of ventricular tachycardia at the request of Dr. Kerman Passey.  History of Present Illness:   Peter Frey developed acute onset of back pain and shortness of breath this afternoon.  His wife reportedly summoned EMS, who found the patient lying on bed, confused.  EKG showed ventricular tachycardia, which was treated with amiodarone 150 mg IV x 1 and defibrillation x 2.  The last few weeks, Peter Frey reports feeling more short of breath with activity.  He has not had any chest pain until today.  He notes a similar episode in 2019 after placement of his ICD.  He received a shock at that time.  He believes that he may have been shocked today, though as noted above, he was still in VT when EMS arrived.  He reports being compliant with his medications.  Heart Pathway Score:     Past Medical History:  Diagnosis Date  . CHF (congestive heart failure) ([place])   . COPD (chronic obstructive pulmonary disease) ([place])   . Coronary artery disease   . MI (myocardial infarction) [facility])     Past Surgical History:  Procedure Laterality Date  . CARDIAC DEFIBRILLATOR PLACEMENT    . CORONARY ARTERY BYPASS GRAFT       Home Medications:  Prior to Admission medications   Medication Sig Start Date Ezekeil Bethel Date Taking? Authorizing Provider  albuterol (VENTOLIN HFA) 108 (90 Base) MCG/ACT inhaler Inhale into the lungs.    [provider]  aspirin 81 MG tablet Take 81 mg by mouth daily.     [provider]  furosemide (LASIX) 40 MG tablet Take by mouth.    [provider]  gabapentin (NEURONTIN) 300 MG capsule Take 300 mg by mouth 3 (three) times daily.    [provider]  losartan (COZAAR) 50 MG tablet Take by mouth.    [provider]  metoprolol (LOPRESSOR) 50 MG tablet Take 50 mg by mouth 2 (two) times daily.    [provider]  sildenafil (VIAGRA) 50 MG tablet Take by mouth.    [provider]  simvastatin (ZOCOR) 20 MG tablet Take 20 mg by mouth daily.    [provider]  spironolactone (ALDACTONE) 25 MG tablet Take by mouth. 08/03/17   [provider]  TIOTROPIUM BROMIDE-OLODATEROL IN Inhale 2 Inhalers into the lungs daily.    [provider]    Inpatient Medications: Scheduled Meds: . [MAR Hold] aspirin  324 mg Oral Once   Continuous Infusions: . sodium chloride     PRN Meds:   Allergies:    Allergies  Allergen Reactions  . Lisinopril Swelling    Lip swelling    Social History:   Social History   Socioeconomic History  . Marital status: Married    Spouse name: Not on file  . Number of children: Not on file  . Years of education: Not on file  . Highest education level: Not on file  Occupational History  . Not on file  Social Needs  . Financial resource strain: Not on file  . Food insecurity    Worry: Not on file    Inability: Not on file  . Transportation needs    Medical: Not on file    Non-medical: Not on file  Tobacco Use  . Smoking status: Former Smoker    Packs/day: 2.00    Years: 43.00    Pack years: 86.00    Types: Cigarettes    Quit date: 2015    Years since quitting: 5.7  . Smokeless tobacco: Never Used  Substance and Sexual Activity  . Alcohol use: Yes    Comment: occas.   . Drug use: Not Currently    Types: Cocaine, Marijuana    Comment: quit in the 70's.  Marland Kitchen. Sexual activity: Not on file  Lifestyle  . Physical activity    Days per week: Not on  file    Minutes per session: Not on file  . Stress: Not on file  Relationships  . Social Musicianconnections    Talks on phone: Not on file    Gets together: Not on file    Attends religious service: Not on file    Active member of club or organization: Not on file    Attends meetings of clubs or organizations: Not on file    Relationship status: Not on file  . Intimate partner violence    Fear of current or ex partner: Not on file    Emotionally abused: Not on file    Physically abused: Not on file    Forced sexual activity: Not on file  Other Topics Concern  . Not on file  Social History Narrative  . Not on file    Family History:   Family History  Problem Relation Age of Onset  . Aneurysm Mother   . Aneurysm Father      ROS:  Review of Systems  Unable to perform ROS: Acuity of condition    Physical Exam/Data:   Vitals:   10/18/18 1520 10/18/18 1521  BP:  98/70  Pulse:  82  Resp:  18  Temp:  97.8 F (36.6 C)  TempSrc:  Oral  SpO2:  97%  Weight: 78 kg   Height: 6' (1.829 m)    No intake or output data in the 24 hours ending 10/18/18 1550 Last 3 Weights 10/18/2018 08/29/2018 08/04/2018  Weight (lbs) 172 lb 172 lb 8 oz 171 lb 3.2 oz  Weight (kg) 78.019 kg 78.245 kg 77.656 kg     Body mass index is 23.33 kg/m.  General:  Well nourished, well developed, in no acute distress HEENT: normal Lymph: no adenopathy Neck: no JVD Endocrine:  No thryomegaly Vascular: 1+ radial pulses, 2+ femoral pulses Cardiac:  normal S1, S2; RRR; no murmurs, rubs, or gallops. Lungs:  clear to auscultation bilaterally, no wheezing, rhonchi or rales  Abd: soft, nontender, no hepatomegaly  Ext: no edema Musculoskeletal:  No deformities, BUE and BLE strength normal and equal Skin: warm and dry  Neuro:  CNs 2-12 intact, no focal abnormalities noted Psych:  Normal affect   EKG:  The EKG was personally reviewed and demonstrates:  NSR with PVC's, IVCD, and non-specific ST changes.  Initial EMS  EKG personally reviewed, demonstrated VT.   Relevant CV Studies: None available.  Laboratory Data:  High Sensitivity Troponin:  No results for input(s): TROPONINIHS in the last 720 hours.   ChemistryNo results for input(s): NA, K, CL, CO2, GLUCOSE, BUN, CREATININE, CALCIUM, GFRNONAA, GFRAA,  ANIONGAP in the last 168 hours.  No results for input(s): PROT, ALBUMIN, AST, ALT, ALKPHOS, BILITOT in the last 168 hours. Hematology Recent Labs  Lab 10/18/18 1528  WBC 5.0  RBC 4.44  HGB 10.6*  HCT 32.3*  MCV 72.7*  MCH 23.9*  MCHC 32.8  RDW 15.5  PLT 152   BNPNo results for input(s): BNP, PROBNP in the last 168 hours.  DDimer No results for input(s): DDIMER in the last 168 hours.   Radiology/Studies:  No results found.  Assessment and Plan:   Ventricular tachycardia: Unclear if ischemic or due to underlying cardiomyopathy.  Given CABG 5 years ago and recent DOE, I am concerned about worsening coronary insufficiency.  Though EKG does not show ST elevation, we have agreed to proceed with urgent cardiac catheterization and possible PCI.  I have reviewed the risks, indications, and alternatives to cardiac catheterization, possible angioplasty, and stenting with the patient. Risks include but are not limited to bleeding, infection, vascular injury, stroke, myocardial infection, arrhythmia, kidney injury, radiation-related injury in the case of prolonged fluoroscopy use, emergency cardiac surgery, and death. The patient understands the risks of serious complication is 1-2 in 1000 with diagnostic cardiac cath and 1-2% or less with angioplasty/stenting.  Emergent verbal consent was obtained.  ICD to be interrogated following cath.  Chronic systolic HF: Patient appears euvolemic.  Continue home medications for now.  Further adjustments may be necessary based on results of cath.  CAD: Back pain noted today in the setting of VT (? Anginal equivalent).  AS above, will proceed with urgent LHC.   Continue aggressive secondary prevention.  CKD stage III: CMP pending.  Will attempt to minimize contrast exposure.  Avoid nephrotoxic drugs.  For questions or updates, please contact [facility] Please consult [email or web address] for contact info under [facility].  Signed, Yvonne Kendall, MD  10/18/2018 3:50 PM

## 2018-10-18 NOTE — ED Notes (Signed)
Pads are on pt. Undressed for cath lab

## 2018-10-18 NOTE — ED Notes (Signed)
Pt taken to cath lab by RN Mateo Flow at this time. Cardiologist End at bedside headed to Cath lab with patient

## 2018-10-18 NOTE — Progress Notes (Signed)
[facility] ICD Interrogation  Date: 10/18/18 Time: 7:51 PM  Device: Medtronic Visia AF MRI VR [id number] (Single chamber ICD)  RV lead: Pacing impedance 458 ohms, shock impedance 57 ohms Capture threshold 0.5 V @ 0.4 ms R wave: 12.5 mV  Mode: VVI LRL 40 bpm  VP: <0.1%  Tachytherapy: VF: >200 bpm, ATP during chargin, 35J x 6 FVT: Off VT: Off  Events: Sustained VT today (14:43, duration 35 minutes).  Average rate 188 bpm  Recommendations: Continue amiodarone infusion overnight and optimize HF medications as tolerated.  Rounding team to consult with EP tomorrow regarding reprogramming of device (consider lowering rate limit for tachytherapy).  Nelva Bush, MD [facility] Pager: [phone]

## 2018-10-18 NOTE — Progress Notes (Signed)
Pt arrived on unit at this time. Alert and oriented. TR band on left wrist. Breathing is normal. Pt denies pain at this time.

## 2018-10-18 NOTE — ED Notes (Signed)
Cardiologist at bedside.  

## 2018-10-18 NOTE — H&P (Signed)
[place] at [place] NAME: Peter Frey    MR#:  [id number]  DATE OF BIRTH:  10/24/1950  DATE OF ADMISSION:  10/18/2018  PRIMARY CARE PHYSICIAN: [facility]   REQUESTING/REFERRING PHYSICIAN: Dr. Harrell Gave End.   CHIEF COMPLAINT:   Chief Complaint  Patient presents with  . VTACH    HISTORY OF PRESENT ILLNESS:  Peter Frey  is a 68 y.o. male with a known history of coronary artery disease status post bypass, chronic systolic CHF status post AICD, COPD, previous history of MI who presents to the hospital due to some chest pain/pressure and noted to have ventricular tachycardia.  Patient says he was in his usual state of health and this afternoon started develop some chest pain/pressure but is unclear if his AICD fired.  He did complain of some backslash chest pain which he attributes to possibly his defibrillator going off.  He presented to the ER and was noted to have sustained ventricular tachycardia.  Patient was cardioverted and still complained of some atypical chest pain/pressure and therefore urgent cardiology consult was obtained.  Patient was seen by cardiology and given his significant cardiac history and complaints of chest pain/pressure and no recent ischemic work-up he was taken to the cardiac catheterization lab.  Patient underwent cardiac catheterization which showed patent previous bypass grafts.  Cardiology suspects that patient's VT was more driven by systolic heart failure and not acute ischemia.  Patient is to be placed on amiodarone drip and is being admitted to the hospital for further evaluation and medical management.  Patient presently denies any chest pains, shortness of breath, nausea, vomiting, abdominal pain, fever chills cough or any other associated symptoms.  Denies any recent travel history of sick contacts.  Patient's COVID-19 test is negative.  PAST MEDICAL HISTORY:   Past Medical History:  Diagnosis  Date  . CHF (congestive heart failure) ([place])   . COPD (chronic obstructive pulmonary disease) ([place])   . Coronary artery disease   . MI (myocardial infarction) ([place])     PAST SURGICAL HISTORY:   Past Surgical History:  Procedure Laterality Date  . CARDIAC DEFIBRILLATOR PLACEMENT    . CORONARY ARTERY BYPASS GRAFT      SOCIAL HISTORY:   Social History   Tobacco Use  . Smoking status: Former Smoker    Packs/day: 2.00    Years: 43.00    Pack years: 86.00    Types: Cigarettes    Quit date: 2015    Years since quitting: 5.7  . Smokeless tobacco: Never Used  Substance Use Topics  . Alcohol use: Yes    Comment: occas.     FAMILY HISTORY:   Family History  Problem Relation Age of Onset  . Aneurysm Mother   . Aneurysm Father     DRUG ALLERGIES:   Allergies  Allergen Reactions  . Lisinopril Swelling    Lip swelling    REVIEW OF SYSTEMS:   Review of Systems  Constitutional: Negative for fever and weight loss.  HENT: Negative for congestion, nosebleeds and tinnitus.   Eyes: Negative for blurred vision, double vision and redness.  Respiratory: Negative for cough, hemoptysis and shortness of breath.   Cardiovascular: Positive for chest pain. Negative for orthopnea, leg swelling and PND.  Gastrointestinal: Negative for abdominal pain, diarrhea, melena, nausea and vomiting.  Genitourinary: Negative for dysuria, hematuria and urgency.  Musculoskeletal: Negative for falls and joint pain.  Neurological: Negative for dizziness, tingling, sensory change,  focal weakness, seizures, weakness and headaches.  Endo/Heme/Allergies: Negative for polydipsia. Does not bruise/bleed easily.  Psychiatric/Behavioral: Negative for depression and memory loss. The patient is not nervous/anxious.     MEDICATIONS AT HOME:   Prior to Admission medications   Medication Sig Start Date End Date Taking? Authorizing Provider  albuterol (VENTOLIN HFA) 108 (90 Base) MCG/ACT inhaler Inhale into the  lungs.    [provider]  aspirin 81 MG tablet Take 81 mg by mouth daily.    [provider]  furosemide (LASIX) 40 MG tablet Take by mouth.    [provider]  gabapentin (NEURONTIN) 300 MG capsule Take 300 mg by mouth 3 (three) times daily.    [provider]  losartan (COZAAR) 50 MG tablet Take by mouth.    [provider]  metoprolol (LOPRESSOR) 50 MG tablet Take 50 mg by mouth 2 (two) times daily.    [provider]  sildenafil (VIAGRA) 50 MG tablet Take by mouth.    [provider]  simvastatin (ZOCOR) 20 MG tablet Take 20 mg by mouth daily.    [provider]  spironolactone (ALDACTONE) 25 MG tablet Take by mouth. 08/03/17   [provider]  TIOTROPIUM BROMIDE-OLODATEROL IN Inhale 2 Inhalers into the lungs daily.    [provider]      VITAL SIGNS:  Blood pressure 110/66, pulse 67, temperature (!) 96.9 F (36.1 C), temperature source Axillary, resp. rate (!) 22, height 6' (1.829 m), weight 79.7 kg, SpO2 98 %.  PHYSICAL EXAMINATION:  Physical Exam  GENERAL:  68 y.o.-year-old patient lying in the bed with no acute distress.  EYES: Pupils equal, round, reactive to light and accommodation. No scleral icterus. Extraocular muscles intact.  HEENT: Head atraumatic, normocephalic. Oropharynx and nasopharynx clear. No oropharyngeal erythema, moist oral mucosa  NECK:  Supple, no jugular venous distention. No thyroid enlargement, no tenderness.  LUNGS: Normal breath sounds bilaterally, no wheezing, rales, rhonchi. No use of accessory muscles of respiration.  CARDIOVASCULAR: S1, S2 RRR. No murmurs, rubs, gallops, clicks.  ABDOMEN: Soft, nontender, nondistended. Bowel sounds present. No organomegaly or mass.  EXTREMITIES: No pedal edema, cyanosis, or clubbing. + 2 pedal & radial pulses b/l.   NEUROLOGIC: Cranial nerves II through XII are intact. No focal Motor or sensory deficits appreciated b/l  PSYCHIATRIC: The patient is alert and oriented x 3.  SKIN: No obvious rash, lesion, or ulcer.   LABORATORY PANEL:   CBC Recent Labs  Lab 10/18/18 1528  WBC 5.0  HGB 10.6*  HCT 32.3*  PLT 152   ------------------------------------------------------------------------------------------------------------------  Chemistries  Recent Labs  Lab 10/18/18 1528  NA 136  K 3.7  CL 101  CO2 22  GLUCOSE 120*  BUN 19  CREATININE 1.74*  CALCIUM 9.1  AST 25  ALT 18  ALKPHOS 23*  BILITOT 0.6   ------------------------------------------------------------------------------------------------------------------  Cardiac Enzymes No results for input(s): TROPONINI in the last 168 hours. ------------------------------------------------------------------------------------------------------------------  RADIOLOGY:  Dg Chest Port 1 View  Result Date: 10/18/2018 CLINICAL DATA:  Chest pain, dyspnea EXAM: PORTABLE CHEST 1 VIEW COMPARISON:  None. FINDINGS: Intact sternotomy wires. Single lead left subclavian ICD is noted with lead tip overlying the right ventricle. Mild enlargement of the cardiopericardial silhouette. Aortic arch atherosclerosis. Otherwise normal mediastinal contour. No pneumothorax. Trace left pleural effusion/thickening. No right pleural effusion. No pulmonary edema. Low lung volumes. Mild left basilar scarring versus atelectasis. IMPRESSION: Low lung volumes with mild left basilar scarring versus atelectasis. Trace left pleural effusion/thickening.  Mild cardiomegaly without pulmonary edema. Electronically Signed   By: Delbert Phenix M.D.   On: 10/18/2018 15:54     IMPRESSION AND PLAN:   68 y.o. male with a known history of coronary artery disease status post bypass, chronic systolic CHF status post AICD, COPD, previous history of MI who presents to the hospital due to some chest pain/pressure and noted to have ventricular tachycardia.  1.  Ventricular tachycardia- this was the  cause of patient's chest pain/pressure and possibly his AICD firing. -Patient was initially cardioverted and now is in a normal sinus rhythm. -Seen by cardiology, status post cardiac catheterization which showed no evidence of acute ischemia, patient's grafts are patent.  Placed on amiodarone drip and will continue to monitor. -Await echocardiogram results.  2.  Chronic systolic CHF- clinically patient is presently not in congestive heart failure. -Continue Aldactone, metoprolol.  3.  History of coronary artery disease status post bypass- patient underwent cardiac catheterization today which showed patent grafts, currently patient is chest pain-free and hemodynamically stable. -Continue aspirin, Zocor, Toprol for now.  4.  Neuropathy-continue gabapentin.    5.  Essential hypertension-continue Toprol, hydralazine as needed.  6.  Hyperlipidemia-continue simvastatin.  7.  COPD-no acute exacerbation.  Continue maintenance inhalers including albuterol and Spiriva.   All the records are reviewed and case discussed with ED provider. Management plans discussed with the patient, family and they are in agreement.  CODE STATUS: Full code  TOTAL TIME TAKING CARE OF THIS PATIENT: 45 minutes.    Houston Siren M.D on 10/18/2018 at 5:59 PM  Between 7am to 6pm - Pager - [phone]  After 6pm go to [email or web address] - password [email or web address] [facility]  [facility]  Office  [phone]  CC: Primary care physician; Center, [facility]

## 2018-10-18 NOTE — ED Triage Notes (Addendum)
Pt called EMS for [facility] and weakness. Found in [place], synchronized cardioverted at 100 joules by EMS and 150 mg amiodarone given by EMS. Pt in NSR on arrival and feeling better. 2 mg versed prior to cardioversion

## 2018-10-18 NOTE — ED Notes (Signed)
covid done

## 2018-10-18 NOTE — ED Provider Notes (Signed)
[facility] Emergency Department Provider Note  Time seen: 3:45 PM  I have reviewed the triage vital signs and the nursing notes.   HISTORY  Chief Complaint VTACH   HPI Peter Frey is a 68 y.o. male with a past medical history of CHF, COPD, prior MI, pacemaker/defibrillator, presents to the emergency department in ventricular tachycardia.  According to the patient for the past week or so he has been feeling more weak and fatigued, states he is stumbling at times around his house.  Today the symptoms were significantly worse so the patient called EMS.  EMS found the patient to be in sustained ventricular tachycardia.  States initial blood pressure was around 751 systolic.  Patient seemed short of breath per EMS and with the low blood pressure the decision was made by EMS to perform 100 J synchronized cardioversion with success and patient transition to a normal sinus rhythm.  EMS called a code STEMI given the sustained ventricular tachycardia.  However upon arrival patient denies any chest pain.  Did appear to have a STEMI earlier this year.  It is unclear why the defibrillator did not go off.  Patient denies feeling the defibrillator discharge.  Patient denies any recent fever or cough.  States some shortness of breath although states that is improved upon arrival.   Past Medical History:  Diagnosis Date  . CHF (congestive heart failure) ([place])   . COPD (chronic obstructive pulmonary disease) ([place])   . Coronary artery disease   . MI (myocardial infarction) ([place])     There are no active problems to display for this patient.   Past Surgical History:  Procedure Laterality Date  . CARDIAC DEFIBRILLATOR PLACEMENT    . CORONARY ARTERY BYPASS GRAFT      Prior to Admission medications   Medication Sig Start Date End Date Taking? Authorizing Provider  albuterol (VENTOLIN HFA) 108 (90 Base) MCG/ACT inhaler Inhale into the lungs.    [provider]  aspirin 81  MG tablet Take 81 mg by mouth daily.    [provider]  furosemide (LASIX) 40 MG tablet Take by mouth.    [provider]  gabapentin (NEURONTIN) 300 MG capsule Take 300 mg by mouth 3 (three) times daily.    [provider]  losartan (COZAAR) 50 MG tablet Take by mouth.    [provider]  metoprolol (LOPRESSOR) 50 MG tablet Take 50 mg by mouth 2 (two) times daily.    [provider]  sildenafil (VIAGRA) 50 MG tablet Take by mouth.    [provider]  simvastatin (ZOCOR) 20 MG tablet Take 20 mg by mouth daily.    [provider]  spironolactone (ALDACTONE) 25 MG tablet Take by mouth. 08/03/17   [provider]  TIOTROPIUM BROMIDE-OLODATEROL IN Inhale 2 Inhalers into the lungs daily.    [provider]    Allergies  Allergen Reactions  . Lisinopril Swelling    Lip swelling    Family History  Problem Relation Age of Onset  . Aneurysm Mother   . Aneurysm Father     Social History Social History   Tobacco Use  . Smoking status: Former Smoker    Packs/day: 2.00    Years: 43.00    Pack years: 86.00    Types: Cigarettes    Quit date: 2015    Years since quitting: 5.7  . Smokeless tobacco: Never Used  Substance Use Topics  . Alcohol use: Yes  Comment: occas.   . Drug use: Not Currently    Types: Cocaine, Marijuana    Comment: quit in the 70's.    Review of Systems Constitutional: Negative for fever.  Positive for generalized fatigue/weakness Cardiovascular: Negative for chest pain. Respiratory: Positive for shortness of breath, now resolved. Gastrointestinal: Negative for abdominal pain Musculoskeletal: Negative for musculoskeletal complaints Neurological: Negative for headache All other ROS negative  ____________________________________________   PHYSICAL EXAM:  VITAL SIGNS: ED Triage Vitals  Enc Vitals Group     BP 10/18/18 1521 98/70     Pulse Rate 10/18/18 1521 82     Resp  10/18/18 1521 18     Temp 10/18/18 1521 97.8 F (36.6 C)     Temp Source 10/18/18 1521 Oral     SpO2 10/18/18 1521 97 %     Weight 10/18/18 1520 172 lb (78 kg)     Height 10/18/18 1520 6' (1.829 m)     Head Circumference --      Peak Flow --      Pain Score 10/18/18 1518 0     Pain Loc --      Pain Edu? --      Excl. in GC? --    Constitutional: Alert and oriented. Well appearing and in no distress. Eyes: Normal exam ENT      Head: Normocephalic and atraumatic.      Mouth/Throat: Mucous membranes are moist. Cardiovascular: Normal rate, regular rhythm. Respiratory: Normal respiratory effort without tachypnea nor retractions. Breath sounds are clear Gastrointestinal: Soft and nontender. No distention.   Musculoskeletal: Nontender with normal range of motion in all extremities.  Neurologic:  Normal speech and language. No gross focal neurologic deficits Skin:  Skin is warm, dry and intact.  Psychiatric: Mood and affect are normal.  ____________________________________________    EKG  EKG viewed and interpreted by myself shows a normal sinus rhythm 80 bpm with a narrow QRS, normal axis, normal intervals, patient does have ST depressions in the lateral leads.  No ST elevation.  ____________________________________________    RADIOLOGY  Chest x-ray not resulted when the patient left the emergency department for the Cath Lab.  ____________________________________________   INITIAL IMPRESSION / ASSESSMENT AND PLAN / ED COURSE  Pertinent labs & imaging results that were available during my care of the patient were reviewed by me and considered in my medical decision making (see chart for details).   Patient presents to the emergency department for ventricular tachycardia.  Differential would include electrolyte or metabolic abnormality, ACS, ventricular tachycardia/arrhythmia.  Cardiology saw the patient in the emergency department as it was initially called a code STEMI.   STEMI was canceled.  However given the patient's high risk with sustained ventricular tachycardia the decision was made by cardiology to take the patient emergently to the Cath Lab for catheterization.  Labs have not yet resulted when the patient left the emergency department.  Chest x-ray is pending.  Patient was awake alert oriented and overall well-appearing.  Cardiology did not want heparin infused.  Patient was finishing his 150 mg of amiodarone that was started by EMS.  Mort SawyersOdie D Cogburn was evaluated in Emergency Department on 10/18/2018 for the symptoms described in the history of present illness. He was evaluated in the context of the global COVID-19 pandemic, which necessitated consideration that the patient might be at risk for infection with the SARS-CoV-2 virus that causes COVID-19. Institutional protocols and algorithms that pertain to the evaluation of patients at risk for COVID-19  are in a state of rapid change based on information released by regulatory bodies including the CDC and federal and state organizations. These policies and algorithms were followed during the patient's care in the ED.  CRITICAL CARE Performed by: Minna Antis   Total critical care time: 20 minutes  Critical care time was exclusive of separately billable procedures and treating other patients.  Critical care was necessary to treat or prevent imminent or life-threatening deterioration.  Critical care was time spent personally by me on the following activities: development of treatment plan with patient and/or surrogate as well as nursing, discussions with consultants, evaluation of patient's response to treatment, examination of patient, obtaining history from patient or surrogate, ordering and performing treatments and interventions, ordering and review of laboratory studies, ordering and review of radiographic studies, pulse oximetry and re-evaluation of patient's  condition.   ____________________________________________   FINAL CLINICAL IMPRESSION(S) / ED DIAGNOSES  High risk chest pain Ventricular tachycardia   Minna Antis, MD 10/18/18 1549

## 2018-10-19 ENCOUNTER — Encounter: Payer: Self-pay | Admitting: *Deleted

## 2018-10-19 ENCOUNTER — Other Ambulatory Visit: Payer: Self-pay

## 2018-10-19 ENCOUNTER — Encounter: Payer: Self-pay | Admitting: Internal Medicine

## 2018-10-19 DIAGNOSIS — I429 Cardiomyopathy, unspecified: Secondary | ICD-10-CM

## 2018-10-19 LAB — BASIC METABOLIC PANEL
Anion gap: 10 (ref 5–15)
BUN: 19 mg/dL (ref 8–23)
CO2: 23 mmol/L (ref 22–32)
Calcium: 8.4 mg/dL — ABNORMAL LOW (ref 8.9–10.3)
Chloride: 107 mmol/L (ref 98–111)
Creatinine, Ser: 1.64 mg/dL — ABNORMAL HIGH (ref 0.61–1.24)
GFR calc Af Amer: 50 mL/min — ABNORMAL LOW (ref >60)
GFR calc non Af Amer: 43 mL/min — ABNORMAL LOW (ref >60)
Glucose, Bld: 108 mg/dL — ABNORMAL HIGH (ref 70–99)
Potassium: 3.9 mmol/L (ref 3.5–5.1)
Sodium: 140 mmol/L (ref 135–145)

## 2018-10-19 LAB — CBC
HCT: 30.9 % — ABNORMAL LOW (ref 39.0–52.0)
Hemoglobin: 9.9 g/dL — ABNORMAL LOW (ref 13.0–17.0)
MCH: 23.4 pg — ABNORMAL LOW (ref 26.0–34.0)
MCHC: 32 g/dL (ref 30.0–36.0)
MCV: 73 fL — ABNORMAL LOW (ref 80.0–100.0)
Platelets: 140 10*3/uL — ABNORMAL LOW (ref 150–400)
RBC: 4.23 MIL/uL (ref 4.22–5.81)
RDW: 15.5 % (ref 11.5–15.5)
WBC: 5.6 10*3/uL (ref 4.0–10.5)
nRBC: 0 % (ref 0.0–0.2)

## 2018-10-19 LAB — ECHOCARDIOGRAM COMPLETE
Height: 72 in
Weight: 2811.31 oz

## 2018-10-19 LAB — MAGNESIUM: Magnesium: 1.8 mg/dL (ref 1.7–2.4)

## 2018-10-19 MED ORDER — AMIODARONE HCL IN DEXTROSE 360-4.14 MG/200ML-% IV SOLN: 30.0000 mg/h | INTRAVENOUS | Status: DC

## 2018-10-19 MED ORDER — AMIODARONE HCL 200 MG PO TABS
400.0000 mg | ORAL_TABLET | Freq: Two times a day (BID) | ORAL | Status: DC
  Administered 2018-10-19 – 2018-10-20 (×2): 400 mg via ORAL
  Filled 2018-10-19 (×2): qty 2

## 2018-10-19 MED ORDER — AMIODARONE HCL IN DEXTROSE 360-4.14 MG/200ML-% IV SOLN
30.0000 mg/h | INTRAVENOUS | Status: AC
  Administered 2018-10-19: 12:00:00 30 mg/h via INTRAVENOUS

## 2018-10-19 MED ORDER — AMIODARONE HCL 200 MG PO TABS: 400.0000 mg | ORAL_TABLET | Freq: Every day | ORAL | Status: DC

## 2018-10-19 NOTE — Progress Notes (Signed)
[place] at [place] NAME: Peter Frey    MR#:  [id number]  DATE OF BIRTH:  November 08, 1950  SUBJECTIVE:   Patient states he is feeling well today.  He denies any palpitations, chest pain, or shortness of breath.  He states he does not remember much of yesterday.  REVIEW OF SYSTEMS:  Review of Systems  Constitutional: Negative for chills and fever.  HENT: Negative for congestion and sore throat.   Eyes: Negative for blurred vision and double vision.  Respiratory: Negative for cough and shortness of breath.   Cardiovascular: Negative for chest pain and palpitations.  Gastrointestinal: Negative for nausea and vomiting.  Genitourinary: Negative for dysuria and urgency.  Musculoskeletal: Negative for back pain and neck pain.  Neurological: Negative for dizziness and headaches.  Psychiatric/Behavioral: Negative for depression. The patient is not nervous/anxious.     DRUG ALLERGIES:   Allergies  Allergen Reactions  . Lisinopril Swelling    Lip swelling   VITALS:  Blood pressure 120/78, pulse 70, temperature 97.9 F (36.6 C), resp. rate (!) 21, height 6' (1.829 m), weight 79.7 kg, SpO2 99 %. PHYSICAL EXAMINATION:  Physical Exam  GENERAL:   Sitting up in bed in no acute distress, well-appearing. HEENT: Head atraumatic, normocephalic. Pupils equal, round, reactive to light and accommodation. No scleral icterus. Extraocular muscles intact. Oropharynx and nasopharynx clear.  NECK:  Supple, no jugular venous distention. No thyroid enlargement. LUNGS: Lungs are clear to auscultation bilaterally. No wheezes, crackles, rhonchi. No use of accessory muscles of respiration.  CARDIOVASCULAR: RRR. S1, S2 normal. No murmurs, rubs, or gallops.  ABDOMEN: Soft, nontender, nondistended. Bowel sounds present.  EXTREMITIES: No pedal edema, cyanosis, or clubbing.  NEUROLOGIC: CN 2-12 intact, no focal deficits. 5/5 muscle strength throughout all extremities.  Sensation intact throughout. Gait not checked.  PSYCHIATRIC: The patient is alert and oriented x 3.  SKIN: No obvious rash, lesion, or ulcer.  LABORATORY PANEL:  Male CBC Recent Labs  Lab 10/19/18 0524  WBC 5.6  HGB 9.9*  HCT 30.9*  PLT 140*   ------------------------------------------------------------------------------------------------------------------ Chemistries  Recent Labs  Lab 10/18/18 1528 10/19/18 0524  NA 136 140  K 3.7 3.9  CL 101 107  CO2 22 23  GLUCOSE 120* 108*  BUN 19 19  CREATININE 1.74* 1.64*  CALCIUM 9.1 8.4*  AST 25  --   ALT 18  --   ALKPHOS 23*  --   BILITOT 0.6  --    RADIOLOGY:  Dg Chest Port 1 View  Result Date: 10/18/2018 CLINICAL DATA:  Chest pain, dyspnea EXAM: PORTABLE CHEST 1 VIEW COMPARISON:  None. FINDINGS: Intact sternotomy wires. Single lead left subclavian ICD is noted with lead tip overlying the right ventricle. Mild enlargement of the cardiopericardial silhouette. Aortic arch atherosclerosis. Otherwise normal mediastinal contour. No pneumothorax. Trace left pleural effusion/thickening. No right pleural effusion. No pulmonary edema. Low lung volumes. Mild left basilar scarring versus atelectasis. IMPRESSION: Low lung volumes with mild left basilar scarring versus atelectasis. Trace left pleural effusion/thickening. Mild cardiomegaly without pulmonary edema. Electronically Signed   By: Ilona Sorrel M.D.   On: 10/18/2018 15:54   ASSESSMENT AND PLAN:   Sustained ventricular tachycardia- due to chronic systolic heart failure. Patient has ICD in place. No additional episodes overnight. -Continue amiodarone drip until this afternoon -Cardiology  planning on transitioning patient to p.o. amiodarone tomorrow -EP to see patient tomorrow -Continue metoprolol -Check magnesium  -Cardiac monitoring  CAD s/p CABG x 3-  no active chest pain this morning -Patient underwent emergent cath on 10/6, which showed non-obstructive CAD -Continue home  aspirin and metoprolol  Chronic systolic CHF- signs of volume overload -ECHO this admission with EF 30-35% and global hypokinesis -Continue home spironolactone and metoprolol  Neuropathy- stable -Continue gabapentin    Hypertension- stable -Continue home metoprolol and spironolactone  Hyperlipidemia- stable -Continue home simvastatin  COPD-no acute exacerbation -Continue home inhalers  Microcytic anemia- no active bleeding -Check anemia panel  CKD III- creatinine is at his baseline of 1.4-1.6 -Avoid nephrotoxic agents -Monitor  All the records are reviewed and case discussed with Care Management/Social Worker. Management plans discussed with the patient, family and they are in agreement.  CODE STATUS: Full Code  TOTAL TIME TAKING CARE OF THIS PATIENT: 40 minutes.   More than 50% of the time was spent in counseling/coordination of care: YES  POSSIBLE D/C IN 1-2 DAYS, DEPENDING ON CLINICAL CONDITION.   Jinny Blossom Anab Vivar M.D on 10/19/2018 at 1:44 PM  Between 7am to 6pm - Pager - [phone]  After 6pm go to [email or web address] - Social research officer, government  [facility] [place] [facility]  Office  [phone]  CC: Primary care physician; [facility]  Note: This dictation was prepared with Dragon dictation along with smaller phrase technology. Any transcriptional errors that result from this process are unintentional.

## 2018-10-19 NOTE — Care Plan (Signed)
Mr. Liuzzi is a 68 year old male with a past medical history notable for CAD status post PCI and CABG, HFrEF secondary to ischemic cardiomyopathy, CKD stage III, hypertension, COPD, prediabetes, and HCV status post treatment who presented to [facility] ED on 10/18/2018 with complaints of acute onset shortness of breath and chest pain.  His wife found him laying in bed confused, therefore EMS was called.  He was found to be in ventricular tachycardia.  He was treated with 150 mg amiodarone IV x1 and defibrillation x2.  Given his history of CAD along with current episode of ventricular tachycardia, he was taken urgently for cardiac cath to rule out ischemia versus underlying cardiomyopathy as cause of ventricular tachycardia.  Cardiac catheterization did not show any obstructive coronary artery disease.  His ventricular tachycardia is being attributed to underlying systolic heart failure.  He has been admitted to ICU overnight for amiodarone infusion.  He is currently hemodynamically stable.  PCCM is not officially consulted, however is available as needed for any critical care needs.    Darel Hong, AGACNP-BC [facility] Pager: [phone] Cell: [phone]

## 2018-10-19 NOTE — Progress Notes (Signed)
Progress Note  Patient Name: Peter Frey Date of Encounter: 10/19/2018  Primary Cardiologist: [facility]  Subjective   Feels well this morning. No chest pain, SOB, palpitations, dizziness, presyncope, or syncope. No further VT on tele.   Inpatient Medications    Scheduled Meds: . arformoterol  15 mcg Nebulization BID   And  . umeclidinium bromide  1 puff Inhalation Daily  . aspirin  324 mg Oral Once  . aspirin EC  81 mg Oral Daily  . Chlorhexidine Gluconate Cloth  6 each Topical Daily  . gabapentin  300 mg Oral TID  . heparin  5,000 Units Subcutaneous Q8H  . metoprolol succinate  25 mg Oral QHS  . simvastatin  20 mg Oral q1800  . sodium chloride flush  3 mL Intravenous Q12H  . spironolactone  25 mg Oral Daily   Continuous Infusions: . sodium chloride Stopped (10/18/18 1751)  . sodium chloride    . amiodarone 30 mg/hr (10/19/18 1033)   PRN Meds: sodium chloride, acetaminophen, albuterol, ondansetron (ZOFRAN) IV, sodium chloride flush   Vital Signs    Vitals:   10/19/18 0900 10/19/18 0914 10/19/18 1000 10/19/18 1100  BP: 113/72  137/78   Pulse: 62 67 65 62  Resp: 15 17 20 15   Temp:      TempSrc:      SpO2: 97% 99% 98% 100%  Weight:      Height:        Intake/Output Summary (Last 24 hours) at 10/19/2018 1104 Last data filed at 10/19/2018 1033 Gross per 24 hour  Intake 484.54 ml  Output 1150 ml  Net -665.46 ml   Filed Weights   10/18/18 1520 10/18/18 1739  Weight: 78 kg 79.7 kg    Telemetry    SR - Personally Reviewed  ECG    No new tracings - Personally Reviewed  Physical Exam   GEN: No acute distress. Pads in place.  Neck: No JVD. Cardiac: RRR, no murmurs, rubs, or gallops.  Respiratory: Clear to auscultation bilaterally.  GI: Soft, nontender, non-distended.   MS: No edema; No deformity. Neuro:  Alert and oriented x 3; Nonfocal.  Psych: Normal affect.  Labs    Chemistry Recent Labs  Lab 10/18/18 1528 10/19/18 0524  NA 136  140  K 3.7 3.9  CL 101 107  CO2 22 23  GLUCOSE 120* 108*  BUN 19 19  CREATININE 1.74* 1.64*  CALCIUM 9.1 8.4*  PROT 7.5  --   ALBUMIN 4.0  --   AST 25  --   ALT 18  --   ALKPHOS 23*  --   BILITOT 0.6  --   GFRNONAA 40* 43*  GFRAA 46* 50*  ANIONGAP 13 10     Hematology Recent Labs  Lab 10/18/18 1528 10/19/18 0524  WBC 5.0 5.6  RBC 4.44 4.23  HGB 10.6* 9.9*  HCT 32.3* 30.9*  MCV 72.7* 73.0*  MCH 23.9* 23.4*  MCHC 32.8 32.0  RDW 15.5 15.5  PLT 152 140*    Cardiac EnzymesNo results for input(s): TROPONINI in the last 168 hours. No results for input(s): TROPIPOC in the last 168 hours.   BNPNo results for input(s): BNP, PROBNP in the last 168 hours.   DDimer No results for input(s): DDIMER in the last 168 hours.   Radiology    Dg Chest Port 1 View  Result Date: 10/18/2018 IMPRESSION: Low lung volumes with mild left basilar scarring versus atelectasis. Trace left pleural effusion/thickening. Mild  cardiomegaly without pulmonary edema. Electronically Signed   By: Delbert Phenix M.D.   On: 10/18/2018 15:54    Cardiac Studies   2D Echo 10/18/2018: 1. Left ventricular ejection fraction, by visual estimation, is 30 to 35%. The left ventricle has severely decreased function. Moderate to severely increased left ventricular size. There is no left ventricular hypertrophy.  2. Elevated mean left atrial pressure.  3. Left ventricular diastolic Doppler parameters are consistent with impaired relaxation pattern of LV diastolic filling.  4. There is global hypokinesis as well as basal inferior/inferoseptal akinesis.  5. Global right ventricle has moderately reduced systolic function.The right ventricular size is mildly enlarged. No increase in right ventricular wall thickness.  6. Left atrial size was normal.  7. Right atrial size was normal.  8. The mitral valve is normal in structure. Trace mitral valve regurgitation.  9. The tricuspid valve is normal in structure. Tricuspid valve  regurgitation is mild. 10. The aortic valve is tricuspid Aortic valve regurgitation was not visualized by color flow Doppler. Mild to moderate aortic valve sclerosis/calcification without any evidence of aortic stenosis. 11. The pulmonic valve was grossly normal. Pulmonic valve regurgitation is not visualized by color flow Doppler. 12. Normal pulmonary artery systolic pressure. 13. A pacer wire is visualized. 14. The interatrial septum was not well visualized. __________  LHC 10/18/2018: Conclusions: 1. Multivessel coronary artery disease, including 50-60% proximal/mid LAD disease, 50% mid LCx and 70% ostial OM1 stenoses (iFR of both = 0.98), and 50% non-dominant proximal RCA lesion. 2. Patent proximal LCx stent with mild diffuse in-stent restenosis. 3. Small but widely patent LIMA-LAD. 4. Chronically occluded vein grafts (presumable to OM1 and lPDA, as operative report is not available). 5. Normal left ventricular filling pressure.  Recommendations: 1. Given non-obstructive coronary artery disease, sustained ventricular tachycardia may be related to underlying systolic heart failure or scar.  It does not appear to be mediated by acute ischemia. 2. Admit to ICU with overnight amiodarone infusion. 3. Continue metoprolol succinate 25 mg daily and spironolactone 25 mg daily. 4. Hold losartan in the setting of soft blood pressure.  ARB or ARNI should be restarted as blood pressure allows. 5. Obtain transthoracic echocardiogram to reassess LVEF. 6. ICD interrogation.    Patient Profile     68 y.o. male with history of CAD s/p 3-vessel CABG in 2015, HFrEF secondary to ICM, VT s/p ICD, CKD stage III, prediabetes, HTN, HCV s/p treatment, and COPD who we are seeing for sustained VT.   Assessment & Plan    1. Sustained VT: -No further episodes -Device interrogation showed sustained VT for a duration of 35 minutes at a rate of 188 bpm with the patient's device threshold set at 200 bpm  -Continue loading with IV amiodarone for a total of 24 hours (18:36 today) followed by initiation of PO amiodarone 400 mg bid x 1 week, then 200 mg bid x 1 week, then 200 mg daily thereafter  -Hopefully, we can have EP round on the patient on 10/8 to have his device settings adjusted  -Continue Toprol XL -Maintain potassium of 4.0 -Check magnesium with recommendation to replete to goal of 2.0 as indicated   2. CAD s/p CABG: -LHC on 10/18/2018 showed multivessel nonobstructive disease as above -Continue medical therapy with ASA, beta blocker, and statin   3. HFrEF secondary to ICM: -He appears euvolemic and well compensated  -Losartan held in the setting of hypotension and acute on CKD, possibly resume on 10/8 -If BP allows, consider transitioning  to [facility], this can be deferred to his primary cardiology team -Continue Toprol XL and spironolactone  4. CKD stage III: -Renal function improved -Monitor  Anemia/thrombocytopenia: -Monitor  For questions or updates, please contact [place] Please consult [email or web address] for contact info under Cardiology/STEMI.    Signed, Christell Faith, PA-C [place] Pager: [phone] 10/19/2018, 11:04 AM

## 2018-10-19 NOTE — Progress Notes (Signed)
Cardiac Individual Treatment Plan  Patient Details  Name: Peter Frey MRN: [id number] Date of Birth: December 11, 1950 Referring Provider:     Cardiac Rehab from 08/29/2018 in [facility] Cardiac and Pulmonary Rehab  Referring Provider  Nicholes Mango MD      Initial Encounter Date:    Cardiac Rehab from 08/29/2018 in [facility] Cardiac and Pulmonary Rehab  Date  08/29/18      Visit Diagnosis: Cardiomyopathy, unspecified type ([place])  Patient's Home Medications on Admission: No current facility-administered medications for this visit.  No current outpatient medications on file.  Facility-Administered Medications Ordered in Other Visits:  .  0.9 %  sodium chloride infusion, , Intravenous, Continuous, End, Harrell Gave, MD, Stopped at 10/18/18 1751 .  0.9 %  sodium chloride infusion, 250 mL, Intravenous, PRN, End, Harrell Gave, MD .  acetaminophen (TYLENOL) tablet 650 mg, 650 mg, Oral, Q4H PRN, End, Christopher, MD .  albuterol (PROVENTIL) (2.5 MG/3ML) 0.083% nebulizer solution 2.5 mg, 2.5 mg, Inhalation, Q4H PRN, End, Christopher, MD .  amiodarone (NEXTERONE PREMIX) 360-4.14 MG/200ML-% (1.8 mg/mL) IV infusion, 30 mg/hr, Intravenous, Continuous, Dunn, Ryan M, PA-C, Last Rate: 16.67 mL/hr at 10/19/18 1249, 30 mg/hr at 10/19/18 1249 .  [START ON 10/20/2018] amiodarone (PACERONE) tablet 400 mg, 400 mg, Oral, Daily, Dunn, Ryan M, PA-C .  arformoterol [facility]) nebulizer solution 15 mcg, 15 mcg, Nebulization, BID, 15 mcg at 10/19/18 0913 **AND** umeclidinium bromide (INCRUSE ELLIPTA) 62.5 MCG/INH 1 puff, 1 puff, Inhalation, Daily, Henreitta Leber, MD, 1 puff at 10/19/18 0905 .  aspirin chewable tablet 324 mg, 324 mg, Oral, Once, Harvest Dark, MD .  aspirin EC tablet 81 mg, 81 mg, Oral, Daily, End, Christopher, MD, 81 mg at 10/19/18 0904 .  Chlorhexidine Gluconate Cloth 2 % PADS 6 each, 6 each, Topical, Daily, Flora Lipps, MD, 6 each at 10/18/18 1752 .  gabapentin (NEURONTIN) capsule 300 mg, 300 mg, Oral, TID,  End, Christopher, MD, 300 mg at 10/19/18 0904 .  heparin injection 5,000 Units, 5,000 Units, Subcutaneous, Q8H, End, Christopher, MD, 5,000 Units at 10/19/18 0522 .  metoprolol succinate (TOPROL-XL) 24 hr tablet 25 mg, 25 mg, Oral, QHS, End, Christopher, MD, 25 mg at 10/18/18 2122 .  ondansetron (ZOFRAN) injection 4 mg, 4 mg, Intravenous, Q6H PRN, End, Christopher, MD .  simvastatin (ZOCOR) tablet 20 mg, 20 mg, Oral, q1800, End, Christopher, MD, 20 mg at 10/18/18 2122 .  sodium chloride flush (NS) 0.9 % injection 3 mL, 3 mL, Intravenous, Q12H, End, Christopher, MD, 3 mL at 10/19/18 0907 .  sodium chloride flush (NS) 0.9 % injection 3 mL, 3 mL, Intravenous, PRN, End, Christopher, MD .  spironolactone (ALDACTONE) tablet 25 mg, 25 mg, Oral, Daily, End, Christopher, MD, 25 mg at 10/19/18 4142  Past Medical History: Past Medical History:  Diagnosis Date  . CHF (congestive heart failure) ([place])   . COPD (chronic obstructive pulmonary disease) ([place])   . Coronary artery disease   . MI (myocardial infarction) ([place])     Tobacco Use: Social History   Tobacco Use  Smoking Status Former Smoker  . Packs/day: 2.00  . Years: 43.00  . Pack years: 86.00  . Types: Cigarettes  . Quit date: 2015  . Years since quitting: 5.7  Smokeless Tobacco Never Used    Labs: Recent Review Scientist, physiological    Labs for ITP Cardiac and Pulmonary Rehab Latest Ref Rng & Units 10/18/2018   Cholestrol 0 - 200 mg/dL 94   LDLCALC 0 - 99 mg/dL 29  HDL >40 mg/dL 50   Trlycerides <150 mg/dL 73       Exercise Target Goals: Exercise Program Goal: Individual exercise prescription set using results from initial 6 min walk test and THRR while considering  patient's activity barriers and safety.   Exercise Prescription Goal: Initial exercise prescription builds to 30-45 minutes a day of aerobic activity, 2-3 days per week.  Home exercise guidelines will be given to patient during program as part of exercise prescription  that the participant will acknowledge.  Activity Barriers & Risk Stratification: Activity Barriers & Cardiac Risk Stratification - 08/29/18 1216      Activity Barriers & Cardiac Risk Stratification   Activity Barriers  Shortness of Breath;Deconditioning;Muscular Weakness    Cardiac Risk Stratification  High       6 Minute Walk: 6 Minute Walk    Row Name 08/29/18 1216         6 Minute Walk   Phase  Initial     Distance  1270 feet     Walk Time  6 minutes     # of Rest Breaks  0     MPH  2.41     METS  3.35     RPE  12     Perceived Dyspnea   2     VO2 Peak  11.72     Symptoms  Yes (comment)     Comments  Calf pain 9/10, SOB     Resting HR  71 bpm     Resting BP  124/62     Resting Oxygen Saturation   99 %     Exercise Oxygen Saturation  during 6 min walk  95 %     Max Ex. HR  95 bpm     Max Ex. BP  128/64     2 Minute Post BP  108/64        Oxygen Initial Assessment:   Oxygen Re-Evaluation:   Oxygen Discharge (Final Oxygen Re-Evaluation):   Initial Exercise Prescription: Initial Exercise Prescription - 08/29/18 1200      Date of Initial Exercise RX and Referring Provider   Date  08/29/18    Referring Provider  Nicholes Mango MD      Treadmill   MPH  2.3    Grade  0.5    Minutes  15    METs  2.92      Recumbant Bike   Level  2    RPM  50    Watts  33    Minutes  15    METs  2.5      NuStep   Level  2    SPM  80    Minutes  15    METs  2.5      Recumbant Elliptical   Level  1    RPM  50    Minutes  15    METs  2.5      Prescription Details   Frequency (times per week)  2    Duration  Progress to 30 minutes of continuous aerobic without signs/symptoms of physical distress      Intensity   THRR 40-80% of Max Heartrate  104-137    Ratings of Perceived Exertion  11-13    Perceived Dyspnea  0-4      Progression   Progression  Continue to progress workloads to maintain intensity without signs/symptoms of physical distress.       Horticulturist, commercial Prescription  Yes    Weight  4 lbs    Reps  10-15       Perform Capillary Blood Glucose checks as needed.  Exercise Prescription Changes: Exercise Prescription Changes    Row Name 08/29/18 1200 09/02/18 1000 09/07/18 1100 10/06/18 1000       Response to Exercise   Blood Pressure (Admit)  124/62  106/66  100/58  -    Blood Pressure (Exercise)  128/64  130/64  132/80  -    Blood Pressure (Exit)  108/64  110/70  132/70  -    Heart Rate (Admit)  71 bpm  80 bpm  69 bpm  -    Heart Rate (Exercise)  95 bpm  101 bpm  104 bpm  -    Heart Rate (Exit)  71 bpm  70 bpm  66 bpm  -    Oxygen Saturation (Admit)  99 %  -  -  -    Oxygen Saturation (Exercise)  95 %  -  -  -    Rating of Perceived Exertion (Exercise)  '12  12  14  '$ -    Perceived Dyspnea (Exercise)  2  -  -  -    Symptoms  calves buring 9/10, SOB  -  -  -    Comments  walk test results  -  -  -    Duration  -  Progress to 30 minutes of  aerobic without signs/symptoms of physical distress  Continue with 30 min of aerobic exercise without signs/symptoms of physical distress.  -    Intensity  -  THRR unchanged  THRR unchanged  -      Progression   Progression  -  Continue to progress workloads to maintain intensity without signs/symptoms of physical distress.  Continue to progress workloads to maintain intensity without signs/symptoms of physical distress.  -    Average METs  -  2.4  2.9  -      Resistance Training   Training Prescription  -  Yes  Yes  -    Weight  -  4 lbs  4 lb  -    Reps  -  10-15  10-15  -      Interval Training   Interval Training  -  -  No  -      Treadmill   MPH  -  2  2  -    Grade  -  0  0  -    Minutes  -  15  15  -    METs  -  2.5  2.5  -      NuStep   Level  -  2  5  -    SPM  -  80  80  -    Minutes  -  15  15  -    METs  -  2.9  3.3  -      Recumbant Elliptical   Level  -  1  -  -    RPM  -  50  -  -    Minutes  -  15  -  -    METs  -  1.6  -  -       Home Exercise Plan   Plans to continue exercise at  -  -  -  Home (comment) considering gym    Frequency  -  -  -  Add  2 additional days to program exercise sessions.    Initial Home Exercises Provided  -  -  -  10/06/18       Exercise Comments: Exercise Comments    Row Name 08/30/18 1012 09/23/18 1301         Exercise Comments  First full day of exercise!  Patient was oriented to gym and equipment including functions, settings, policies, and procedures.  Patient's individual exercise prescription and treatment plan were reviewed.  All starting workloads were established based on the results of the 6 minute walk test done at initial orientation visit.  The plan for exercise progression was also introduced and progression will be customized based on patient's performance and goals.  Peter Frey has been out since last review due to illness         Exercise Goals and Review: Exercise Goals    Row Name 08/29/18 1241             Exercise Goals   Increase Physical Activity  Yes       Intervention  Provide advice, education, support and counseling about physical activity/exercise needs.;Develop an individualized exercise prescription for aerobic and resistive training based on initial evaluation findings, risk stratification, comorbidities and participant's personal goals.       Expected Outcomes  Short Term: Attend rehab on a regular basis to increase amount of physical activity.;Long Term: Add in home exercise to make exercise part of routine and to increase amount of physical activity.;Long Term: Exercising regularly at least 3-5 days a week.       Increase Strength and Stamina  Yes       Intervention  Provide advice, education, support and counseling about physical activity/exercise needs.;Develop an individualized exercise prescription for aerobic and resistive training based on initial evaluation findings, risk stratification, comorbidities and participant's personal goals.       Expected  Outcomes  Short Term: Increase workloads from initial exercise prescription for resistance, speed, and METs.;Short Term: Perform resistance training exercises routinely during rehab and add in resistance training at home;Long Term: Improve cardiorespiratory fitness, muscular endurance and strength as measured by increased METs and functional capacity (6MWT)       Able to understand and use rate of perceived exertion (RPE) scale  Yes       Intervention  Provide education and explanation on how to use RPE scale       Expected Outcomes  Short Term: Able to use RPE daily in rehab to express subjective intensity level;Long Term:  Able to use RPE to guide intensity level when exercising independently       Able to understand and use Dyspnea scale  Yes       Intervention  Provide education and explanation on how to use Dyspnea scale       Expected Outcomes  Short Term: Able to use Dyspnea scale daily in rehab to express subjective sense of shortness of breath during exertion;Long Term: Able to use Dyspnea scale to guide intensity level when exercising independently       Knowledge and understanding of Target Heart Rate Range (THRR)  Yes       Intervention  Provide education and explanation of THRR including how the numbers were predicted and where they are located for reference       Expected Outcomes  Short Term: Able to state/look up THRR;Short Term: Able to use daily as guideline for intensity in rehab;Long Term: Able to use THRR to govern intensity when exercising independently  Able to check pulse independently  Yes       Intervention  Provide education and demonstration on how to check pulse in carotid and radial arteries.;Review the importance of being able to check your own pulse for safety during independent exercise       Expected Outcomes  Short Term: Able to explain why pulse checking is important during independent exercise;Long Term: Able to check pulse independently and accurately        Understanding of Exercise Prescription  Yes       Intervention  Provide education, explanation, and written materials on patient's individual exercise prescription       Expected Outcomes  Short Term: Able to explain program exercise prescription;Long Term: Able to explain home exercise prescription to exercise independently          Exercise Goals Re-Evaluation : Exercise Goals Re-Evaluation    [place] Name 08/30/18 1012 09/02/18 1024 09/07/18 1111         Exercise Goal Re-Evaluation   Exercise Goals Review  Increase Physical Activity;Increase Strength and Stamina;Able to understand and use rate of perceived exertion (RPE) scale;Able to understand and use Dyspnea scale;Knowledge and understanding of Target Heart Rate Range (THRR);Able to check pulse independently;Understanding of Exercise Prescription  Increase Physical Activity;Increase Strength and Stamina;Able to understand and use rate of perceived exertion (RPE) scale;Knowledge and understanding of Target Heart Rate Range (THRR);Able to check pulse independently;Understanding of Exercise Prescription  Increase Physical Activity;Increase Strength and Stamina;Able to understand and use rate of perceived exertion (RPE) scale;Knowledge and understanding of Target Heart Rate Range (THRR);Able to check pulse independently;Understanding of Exercise Prescription     Comments  Reviewed RPE scale, THR and program prescription with pt today.  Pt voiced understanding and was given a copy of goals to take home.  Peter Frey has done well in his first week of HT.  He has a very positive attitude and seems to enjoy exercise.  The TM has been most challenging  Peter Frey has increased level on NS.  Staff will continue to encourage progress.     Expected Outcomes  Short: Use RPE daily to regulate intensity. Long: Follow program prescription in THR.  Short - attend HT consistently Long - improve overall MET level  Short - increase TM level Long - improve overall MET level         Discharge Exercise Prescription (Final Exercise Prescription Changes): Exercise Prescription Changes - 10/06/18 1000      Home Exercise Plan   Plans to continue exercise at  Home (comment)   considering gym   Frequency  Add 2 additional days to program exercise sessions.    Initial Home Exercises Provided  10/06/18       Nutrition:  Target Goals: Understanding of nutrition guidelines, daily intake of sodium '1500mg'$ , cholesterol '200mg'$ , calories 30% from fat and 7% or less from saturated fats, daily to have 5 or more servings of fruits and vegetables.  Biometrics: Pre Biometrics - 08/29/18 1242      Pre Biometrics   Height  6' (1.829 m)    Weight  172 lb 8 oz (78.2 kg)    BMI (Calculated)  23.39    Single Leg Stand  30 seconds        Nutrition Therapy Plan and Nutrition Goals: Nutrition Therapy & Goals - 08/29/18 1200      Nutrition Therapy   Diet  low Na, HH diet    Protein (specify units)  62g    Fiber  30  grams    Whole Grain Foods  3 servings    Saturated Fats  12 max. grams    Fruits and Vegetables  5 servings/day    Sodium  1.5 grams      Personal Nutrition Goals   Nutrition Goal  ST: when pt does not have breakfast, have mid-morning protein snack LT: gain energy,  reduce SOB    Comments  Pt reports eating soul food mostly. He reports loving vegetables, disliking bread, eating beans and chicken (mostly baked, but with skin- will sometimes have fried chicken), likes to use butter and [employer] dressing - will have salads a couple times a week, will have fish 1x/week, 1 egg 2-3x/week, B (eggs, cheese and sausage) a couple times a week. Pt will normall have just dinner around 3pm (if pt has snack too close to dinner will not eat) - discussed a protein rich mid-morning snack (discussed healthy options). Pt reports that he drinks mountain dew mostly during the day. Discussed HH eating.      Intervention Plan   Intervention  Prescribe, educate and counsel  regarding individualized specific dietary modifications aiming towards targeted core components such as weight, hypertension, lipid management, diabetes, heart failure and other comorbidities.;Nutrition handout(s) given to patient.    Expected Outcomes  Short Term Goal: Understand basic principles of dietary content, such as calories, fat, sodium, cholesterol and nutrients.;Short Term Goal: A plan has been developed with personal nutrition goals set during dietitian appointment.;Long Term Goal: Adherence to prescribed nutrition plan.       Nutrition Assessments: Nutrition Assessments - 08/29/18 1200      MEDFICTS Scores   Pre Score  43       Nutrition Goals Re-Evaluation: Nutrition Goals Re-Evaluation    [place] Name 10/04/18 1016             Goals   Nutrition Goal  ST: when pt does not have breakfast, have mid-morning protein snack LT: gain energy,  reduce SOB       Comment  Pt reports eating the same way since the last time we spoke, discussed the importance of getting enough protein and mechanical eating when he is not hungry.       Expected Outcome  ST: when pt does not have breakfast, have mid-morning protein snack LT: gain energy,  reduce SOB          Nutrition Goals Discharge (Final Nutrition Goals Re-Evaluation): Nutrition Goals Re-Evaluation - 10/04/18 1016      Goals   Nutrition Goal  ST: when pt does not have breakfast, have mid-morning protein snack LT: gain energy,  reduce SOB    Comment  Pt reports eating the same way since the last time we spoke, discussed the importance of getting enough protein and mechanical eating when he is not hungry.    Expected Outcome  ST: when pt does not have breakfast, have mid-morning protein snack LT: gain energy,  reduce SOB       Psychosocial: Target Goals: Acknowledge presence or absence of significant depression and/or stress, maximize coping skills, provide positive support system. Participant is able to verbalize types and ability  to use techniques and skills needed for reducing stress and depression.   Initial Review & Psychosocial Screening: Initial Psych Review & Screening - 08/25/18 1418      Initial Review   Current issues with  None Identified      Family Dynamics   Good Support System?  Yes   Wife and family  Barriers   Psychosocial barriers to participate in program  There are no identifiable barriers or psychosocial needs.;The patient should benefit from training in stress management and relaxation.      Screening Interventions   Interventions  Encouraged to exercise;To provide support and resources with identified psychosocial needs;Provide feedback about the scores to participant    Expected Outcomes  Short Term goal: Utilizing psychosocial counselor, staff and physician to assist with identification of specific Stressors or current issues interfering with healing process. Setting desired goal for each stressor or current issue identified.;Long Term Goal: Stressors or current issues are controlled or eliminated.;Short Term goal: Identification and review with participant of any Quality of Life or Depression concerns found by scoring the questionnaire.;Long Term goal: The participant improves quality of Life and PHQ9 Scores as seen by post scores and/or verbalization of changes       Quality of Life Scores:  Quality of Life - 08/29/18 1242      Quality of Life   Select  Quality of Life      Quality of Life Scores   Health/Function Pre  15.07 %    Socioeconomic Pre  20.36 %    Psych/Spiritual Pre  23.57 %    Family Pre  28.8 %    GLOBAL Pre  20.08 %      Scores of 19 and below usually indicate a poorer quality of life in these areas.  A difference of  2-3 points is a clinically meaningful difference.  A difference of 2-3 points in the total score of the Quality of Life Index has been associated with significant improvement in overall quality of life, self-image, physical symptoms, and general  health in studies assessing change in quality of life.  PHQ-9: Recent Review Flowsheet Data    Depression screen [facility]: Lambert Rhonda W 2/9 08/29/2018   Decreased Interest 0   Down, Depressed, Hopeless 0   PHQ - 2 Score 0   Altered sleeping 0   Tired, decreased energy 3   Change in appetite 0   Feeling bad or failure about yourself  0   Trouble concentrating 0   Moving slowly or fidgety/restless 0   Suicidal thoughts 0   PHQ-9 Score 3   Difficult doing work/chores Not difficult at all     Interpretation of Total Score  Total Score Depression Severity:  1-4 = Minimal depression, 5-9 = Mild depression, 10-14 = Moderate depression, 15-19 = Moderately severe depression, 20-27 = Severe depression   Psychosocial Evaluation and Intervention: Psychosocial Evaluation - 08/25/18 1426      Psychosocial Evaluation & Interventions   Interventions  Encouraged to exercise with the program and follow exercise prescription    Comments  Peter Frey has no barriers to entering the program. He is eager to start the program so he can build up his stamina.  He stated that he has seen a psychiatrist this year  and reports he does not feel depressed. He states he is doing fine dealing with the fact he has heart disease and the severity of his disease.    Expected Outcomes  Peter Frey will continue to deal well with his heart disease.  He will be able to benefit from the program to help him feel stronger and more able to meet his daily routines.    Continue Psychosocial Services   Follow up required by staff       Psychosocial Re-Evaluation:   Psychosocial Discharge (Final Psychosocial Re-Evaluation):   Vocational Rehabilitation: Provide vocational rehab assistance to  qualifying candidates.   Vocational Rehab Evaluation & Intervention: Vocational Rehab - 08/25/18 1421      Initial Vocational Rehab Evaluation & Intervention   Assessment shows need for Vocational Rehabilitation  No       Education: Education Goals:  Education classes will be provided on a variety of topics geared toward better understanding of heart health and risk factor modification. Participant will state understanding/return demonstration of topics presented as noted by education test scores.  Learning Barriers/Preferences: Learning Barriers/Preferences - 08/25/18 1421      Learning Barriers/Preferences   Learning Barriers  None    Learning Preferences  None       Education Topics:  AED/CPR: - Group verbal and written instruction with the use of models to demonstrate the basic use of the AED with the basic ABC's of resuscitation.   General Nutrition Guidelines/Fats and Fiber: -Group instruction provided by verbal, written material, models and posters to present the general guidelines for heart healthy nutrition. Gives an explanation and review of dietary fats and fiber.   Controlling Sodium/Reading Food Labels: -Group verbal and written material supporting the discussion of sodium use in heart healthy nutrition. Review and explanation with models, verbal and written materials for utilization of the food label.   Exercise Physiology & General Exercise Guidelines: - Group verbal and written instruction with models to review the exercise physiology of the cardiovascular system and associated critical values. Provides general exercise guidelines with specific guidelines to those with heart or lung disease.    Aerobic Exercise & Resistance Training: - Gives group verbal and written instruction on the various components of exercise. Focuses on aerobic and resistive training programs and the benefits of this training and how to safely progress through these programs..   Flexibility, Balance, Mind/Body Relaxation: Provides group verbal/written instruction on the benefits of flexibility and balance training, including mind/body exercise modes such as yoga, pilates and tai chi.  Demonstration and skill practice provided.   Stress  and Anxiety: - Provides group verbal and written instruction about the health risks of elevated stress and causes of high stress.  Discuss the correlation between heart/lung disease and anxiety and treatment options. Review healthy ways to manage with stress and anxiety.   Depression: - Provides group verbal and written instruction on the correlation between heart/lung disease and depressed mood, treatment options, and the stigmas associated with seeking treatment.   Anatomy & Physiology of the Heart: - Group verbal and written instruction and models provide basic cardiac anatomy and physiology, with the coronary electrical and arterial systems. Review of Valvular disease and Heart Failure   Cardiac Procedures: - Group verbal and written instruction to review commonly prescribed medications for heart disease. Reviews the medication, class of the drug, and side effects. Includes the steps to properly store meds and maintain the prescription regimen. (beta blockers and nitrates)   Cardiac Medications I: - Group verbal and written instruction to review commonly prescribed medications for heart disease. Reviews the medication, class of the drug, and side effects. Includes the steps to properly store meds and maintain the prescription regimen.   Cardiac Medications II: -Group verbal and written instruction to review commonly prescribed medications for heart disease. Reviews the medication, class of the drug, and side effects. (all other drug classes)    Go Sex-Intimacy & Heart Disease, Get SMART - Goal Setting: - Group verbal and written instruction through game format to discuss heart disease and the return to sexual intimacy. Provides group verbal and written material  to discuss and apply goal setting through the application of the S.M.A.R.T. Method.   Other Matters of the Heart: - Provides group verbal, written materials and models to describe Stable Angina and Peripheral Artery. Includes  description of the disease process and treatment options available to the cardiac patient.   Exercise & Equipment Safety: - Individual verbal instruction and demonstration of equipment use and safety with use of the equipment.   Cardiac Rehab from 08/29/2018 in [facility] Cardiac and Pulmonary Rehab  Date  08/29/18  Educator  [facility]  Instruction Review Code  1- Verbalizes Understanding      Infection Prevention: - Provides verbal and written material to individual with discussion of infection control including proper hand washing and proper equipment cleaning during exercise session.   Cardiac Rehab from 08/29/2018 in [facility] Cardiac and Pulmonary Rehab  Date  08/29/18  Educator  [facility]  Instruction Review Code  1- Verbalizes Understanding      Falls Prevention: - Provides verbal and written material to individual with discussion of falls prevention and safety.   Cardiac Rehab from 08/29/2018 in [facility] Cardiac and Pulmonary Rehab  Date  08/29/18  Educator  [facility]  Instruction Review Code  1- Verbalizes Understanding      Diabetes: - Individual verbal and written instruction to review signs/symptoms of diabetes, desired ranges of glucose level fasting, after meals and with exercise. Acknowledge that pre and post exercise glucose checks will be done for 3 sessions at entry of program.   Know Your Numbers and Risk Factors: -Group verbal and written instruction about important numbers in your health.  Discussion of what are risk factors and how they play a role in the disease process.  Review of Cholesterol, Blood Pressure, Diabetes, and BMI and the role they play in your overall health.   Sleep Hygiene: -Provides group verbal and written instruction about how sleep can affect your health.  Define sleep hygiene, discuss sleep cycles and impact of sleep habits. Review good sleep hygiene tips.    Other: -Provides group and verbal instruction on various topics (see comments)   Knowledge Questionnaire  Score: Knowledge Questionnaire Score - 08/29/18 1243      Knowledge Questionnaire Score   Pre Score  20/26   Education Focus: Angina, Heart Failure, Nutrition, Exercise      Core Components/Risk Factors/Patient Goals at Admission: Personal Goals and Risk Factors at Admission - 08/29/18 1244      Core Components/Risk Factors/Patient Goals on Admission    Weight Management  Yes;Weight Maintenance    Intervention  Weight Management: Develop a combined nutrition and exercise program designed to reach desired caloric intake, while maintaining appropriate intake of nutrient and fiber, sodium and fats, and appropriate energy expenditure required for the weight goal.;Weight Management: Provide education and appropriate resources to help participant work on and attain dietary goals.    Admit Weight  172 lb 8 oz (78.2 kg)    Goal Weight: Short Term  172 lb (78 kg)    Goal Weight: Long Term  172 lb (78 kg)    Expected Outcomes  Short Term: Continue to assess and modify interventions until short term weight is achieved;Long Term: Adherence to nutrition and physical activity/exercise program aimed toward attainment of established weight goal;Weight Maintenance: Understanding of the daily nutrition guidelines, which includes 25-35% calories from fat, 7% or less cal from saturated fats, less than '200mg'$  cholesterol, less than 1.5gm of sodium, & 5 or more servings of fruits and vegetables daily  Intervention  Provide education about signs/symptoms and action to take for hypo/hyperglycemia.;Provide education about proper nutrition, including hydration, and aerobic/resistive exercise prescription along with prescribed medications to achieve blood glucose in normal ranges: Fasting glucose 65-99 mg/dL    Expected Outcomes  Short Term: Participant verbalizes understanding of the signs/symptoms and immediate care of hyper/hypoglycemia, proper foot care and importance of medication, aerobic/resistive exercise and  nutrition plan for blood glucose control.;Long Term: Attainment of HbA1C < 7%.    Heart Failure  Yes    Intervention  Provide a combined exercise and nutrition program that is supplemented with education, support and counseling about heart failure. Directed toward relieving symptoms such as shortness of breath, decreased exercise tolerance, and extremity edema.    Expected Outcomes  Improve functional capacity of life;Short term: Attendance in program 2-3 days a week with increased exercise capacity. Reported lower sodium intake. Reported increased fruit and vegetable intake. Reports medication compliance.;Short term: Daily weights obtained and reported for increase. Utilizing diuretic protocols set by physician.;Long term: Adoption of self-care skills and reduction of barriers for early signs and symptoms recognition and intervention leading to self-care maintenance.    Hypertension  Yes    Intervention  Provide education on lifestyle modifcations including regular physical activity/exercise, weight management, moderate sodium restriction and increased consumption of fresh fruit, vegetables, and low fat dairy, alcohol moderation, and smoking cessation.;Monitor prescription use compliance.    Expected Outcomes  Short Term: Continued assessment and intervention until BP is < 140/5m HG in hypertensive participants. < 130/869mHG in hypertensive participants with diabetes, heart failure or chronic kidney disease.;Long Term: Maintenance of blood pressure at goal levels.    Lipids  Yes    Intervention  Provide education and support for participant on nutrition & aerobic/resistive exercise along with prescribed medications to achieve LDL '70mg'$ , HDL >'40mg'$ .    Expected Outcomes  Short Term: Participant states understanding of desired cholesterol values and is compliant with medications prescribed. Participant is following exercise prescription and nutrition guidelines.;Long Term: Cholesterol controlled with  medications as prescribed, with individualized exercise RX and with personalized nutrition plan. Value goals: LDL < '70mg'$ , HDL > 40 mg.       Core Components/Risk Factors/Patient Goals Review:  Goals and Risk Factor Review    Row Name 10/06/18 1019             Core Components/Risk Factors/Patient Goals Review   Personal Goals Review  Hypertension;Lipids;Other       Review  DaShanon Brows taking all meds as directed.  He is trying to eat more vegetables and less fried food.  He is walking some outside class.He does check BP at home and it stays stable.       Expected Outcomes  Short - continue heart healthy changes Long - maintain heart healthy habits          Core Components/Risk Factors/Patient Goals at Discharge (Final Review):  Goals and Risk Factor Review - 10/06/18 1019      Core Components/Risk Factors/Patient Goals Review   Personal Goals Review  Hypertension;Lipids;Other    Review  DaShanon Brows taking all meds as directed.  He is trying to eat more vegetables and less fried food.  He is walking some outside class.He does check BP at home and it stays stable.    Expected Outcomes  Short - continue heart healthy changes Long - maintain heart healthy habits       ITP Comments: ITP Comments    Row Name 08/25/18 1430 08/29/18  1215 09/13/18 1107 09/21/18 0600 10/19/18 1333   ITP Comments  nitial Orientation Virtual Call completed.   EP/RD appt is on 8/17 Diagnosis documentation can be found in MEDIA tab [facility] records  Completed 6MWT, gym orientation, and RD evaluation. Initial ITP created and sent for review to Dr. Emily Filbert, Medical Director.  Returning call to pt. He called to leave [place] a message that he has cough and congestion.  They had a COVID test done and it was negative.  He is taking some medicine now to feel better.  Encouraged him to stay home until he is symptom free for 48 hours.  30 Day review. Continue with ITP unless directed changes per Medical Director review.  Still out  recuperating from cough/congestion  30 day review completed. ITP sent to Dr. Emily Filbert, Medical Director of Cardiac and Pulmonary Rehab. Continue with ITP unless changes are made by physician.  Department closed starting 10/2 until further notice by infection prevention and Health at Work teams for [place].      Comments: 30 day review

## 2018-10-20 DIAGNOSIS — I25118 Atherosclerotic heart disease of native coronary artery with other forms of angina pectoris: Secondary | ICD-10-CM

## 2018-10-20 DIAGNOSIS — T82118S Breakdown (mechanical) of other cardiac electronic device, sequela: Secondary | ICD-10-CM

## 2018-10-20 DIAGNOSIS — Z951 Presence of aortocoronary bypass graft: Secondary | ICD-10-CM

## 2018-10-20 DIAGNOSIS — I255 Ischemic cardiomyopathy: Secondary | ICD-10-CM

## 2018-10-20 LAB — BASIC METABOLIC PANEL
Anion gap: 6 (ref 5–15)
BUN: 15 mg/dL (ref 8–23)
CO2: 27 mmol/L (ref 22–32)
Calcium: 8.7 mg/dL — ABNORMAL LOW (ref 8.9–10.3)
Chloride: 108 mmol/L (ref 98–111)
Creatinine, Ser: 1.5 mg/dL — ABNORMAL HIGH (ref 0.61–1.24)
GFR calc Af Amer: 55 mL/min — ABNORMAL LOW (ref >60)
GFR calc non Af Amer: 48 mL/min — ABNORMAL LOW (ref >60)
Glucose, Bld: 103 mg/dL — ABNORMAL HIGH (ref 70–99)
Potassium: 4 mmol/L (ref 3.5–5.1)
Sodium: 141 mmol/L (ref 135–145)

## 2018-10-20 LAB — IRON AND TIBC
Iron: 126 ug/dL (ref 45–182)
Saturation Ratios: 44 % — ABNORMAL HIGH (ref 17.9–39.5)
TIBC: 287 ug/dL (ref 250–450)
UIBC: 161 ug/dL

## 2018-10-20 LAB — CBC
HCT: 31.4 % — ABNORMAL LOW (ref 39.0–52.0)
Hemoglobin: 10.3 g/dL — ABNORMAL LOW (ref 13.0–17.0)
MCH: 23.8 pg — ABNORMAL LOW (ref 26.0–34.0)
MCHC: 32.8 g/dL (ref 30.0–36.0)
MCV: 72.7 fL — ABNORMAL LOW (ref 80.0–100.0)
Platelets: 158 10*3/uL (ref 150–400)
RBC: 4.32 MIL/uL (ref 4.22–5.81)
RDW: 15.1 % (ref 11.5–15.5)
WBC: 5.2 10*3/uL (ref 4.0–10.5)
nRBC: 0 % (ref 0.0–0.2)

## 2018-10-20 LAB — FERRITIN: Ferritin: 242 ng/mL (ref 24–336)

## 2018-10-20 LAB — FOLATE: Folate: 15.4 ng/mL (ref >5.9)

## 2018-10-20 LAB — MAGNESIUM: Magnesium: 1.8 mg/dL (ref 1.7–2.4)

## 2018-10-20 LAB — VITAMIN B12: Vitamin B-12: 242 pg/mL (ref 180–914)

## 2018-10-20 NOTE — Plan of Care (Signed)
  Problem: Clinical Measurements: Goal: Respiratory complications will improve Outcome: Progressing Note: On room air   Problem: Activity: Goal: Risk for activity intolerance will decrease Outcome: Progressing Note: Up independently in room/hall tolerating well   Problem: Coping: Goal: Level of anxiety will decrease Outcome: Progressing   Problem: Elimination: Goal: Will not experience complications related to urinary retention Outcome: Progressing   Problem: Pain Managment: Goal: General experience of comfort will improve Outcome: Progressing Note: No complaints of pain this shift   Problem: Safety: Goal: Ability to remain free from injury will improve Outcome: Progressing   Problem: Skin Integrity: Goal: Risk for impaired skin integrity will decrease Outcome: Progressing   Problem: Education: Goal: Knowledge of General Education information will improve Description: Including pain rating scale, medication(s)/side effects and non-pharmacologic comfort measures Outcome: Completed/Met   Problem: Nutrition: Goal: Adequate nutrition will be maintained Outcome: Completed/Met

## 2018-10-20 NOTE — Discharge Summary (Signed)
[facility] [place] at [facility]   PATIENT NAME: Peter GrammesOdie Frey    MR#:  [id number]  DATE OF BIRTH:  05-31-1950  DATE OF ADMISSION:  10/18/2018   ADMITTING PHYSICIAN: Yvonne Kendallhristopher End, MD  DATE OF DISCHARGE: 10/20/18  PRIMARY CARE PHYSICIAN: [facility]   ADMISSION DIAGNOSIS:  Ventricular tachycardia (HCC) [I47.2] DISCHARGE DIAGNOSIS:  Principal Problem:   Ventricular tachycardia (HCC)  SECONDARY DIAGNOSIS:   Past Medical History:  Diagnosis Date  . CHF (congestive heart failure) (HCC)   . COPD (chronic obstructive pulmonary disease) (HCC)   . Coronary artery disease   . MI (myocardial infarction) [facility](HCC)    HOSPITAL COURSE:   Peter Frey is a 68 year old male who presented to the ED with chest pain and pressure.  In the ED, he was noted to have sustained ventricular tachycardia.  He was cardioverted, but still continued to have chest pain, so he was taken urgently to cardiac cath.  He was admitted for further management.  Sustained ventricular tachycardia- due to chronic systolic heart failure. Patient has ICD in place but HR was running below detection of his defibrillator.  -Underwent cardiac cath 10/6 with non-obstructive CAD -Treated with amiodarone drip- EP did not recommend continuing amiodarone on discharge -ICD was reprogrammed from detection at 200bpm to 170 bpm -Continued metoprolol -Needs to f/u with cardiologist in 1 week  CAD s/p CABG x 3- no active chest pain on discharge -Patient underwent emergent cath on 10/6, which showed non-obstructive CAD -Continued home aspirin and metoprolol  Chronic systolic CHF- signs of volume overload -ECHO this admission with EF 30-35% and global hypokinesis -Continued home spironolactone and metoprolol  Neuropathy- stable -Continued gabapentin   Hypertension- stable -Continued home metoprolol and spironolactone  Hyperlipidemia- stable -Continued home simvastatin  COPD-no acute  exacerbation -Continued home inhalers  Microcytic anemia- no active bleeding -Anemia panel was unremarkable  CKD III- creatinine is at his baseline of 1.4-1.6 -Recheck BMP as an outpatient  DISCHARGE CONDITIONS:  Sustained ventricular tachycardia CAD Chronic systolic CHF Neuropathy Hypertension Hyperlipidemia COPD Microcytic anemia CKD III CONSULTS OBTAINED:  Treatment Team:  Yvonne KendallEnd, Christopher, MD DRUG ALLERGIES:   Allergies  Allergen Reactions  . Lisinopril Swelling    Lip swelling   DISCHARGE MEDICATIONS:   Allergies as of 10/20/2018      Reactions   Lisinopril Swelling   Lip swelling      Medication List    TAKE these medications   albuterol 108 (90 Base) MCG/ACT inhaler Commonly known as: VENTOLIN HFA Inhale into the lungs.   aspirin 81 MG tablet Take 81 mg by mouth daily.   furosemide 40 MG tablet Commonly known as: LASIX Take 40 mg by mouth daily.   gabapentin 300 MG capsule Commonly known as: NEURONTIN Take 300 mg by mouth 3 (three) times daily.   losartan 50 MG tablet Commonly known as: COZAAR Take 50 mg by mouth daily.   metoprolol tartrate 50 MG tablet Commonly known as: LOPRESSOR Take 50 mg by mouth 2 (two) times daily.   sildenafil 50 MG tablet Commonly known as: VIAGRA Take by mouth.   simvastatin 20 MG tablet Commonly known as: ZOCOR Take 20 mg by mouth daily.   spironolactone 25 MG tablet Commonly known as: ALDACTONE Take 25 mg by mouth daily.   TIOTROPIUM BROMIDE-OLODATEROL IN Inhale 2 Inhalers into the lungs daily.        DISCHARGE INSTRUCTIONS:  1.  Follow-up with PCP in 5 days 2.  Follow-up with cardiology  in 1 week DIET:  Cardiac diet DISCHARGE CONDITION:  Stable ACTIVITY:  Activity as tolerated OXYGEN:  Home Oxygen: No.  Oxygen Delivery: room air DISCHARGE LOCATION:  home   If you experience worsening of your admission symptoms, develop shortness of breath, life threatening emergency, suicidal or  homicidal thoughts you must seek medical attention immediately by calling 911 or calling your MD immediately  if symptoms less severe.  You Must read complete instructions/literature along with all the possible adverse reactions/side effects for all the Medicines you take and that have been prescribed to you. Take any new Medicines after you have completely understood and accpet all the possible adverse reactions/side effects.   Please note  You were cared for by a hospitalist during your hospital stay. If you have any questions about your discharge medications or the care you received while you were in the hospital after you are discharged, you can call the unit and asked to speak with the hospitalist on call if the hospitalist that took care of you is not available. Once you are discharged, your primary care physician will handle any further medical issues. Please note that NO REFILLS for any discharge medications will be authorized once you are discharged, as it is imperative that you return to your primary care physician (or establish a relationship with a primary care physician if you do not have one) for your aftercare needs so that they can reassess your need for medications and monitor your lab values.    On the day of Discharge:  VITAL SIGNS:  Blood pressure 113/68, pulse 68, temperature 98.2 F (36.8 C), temperature source Oral, resp. rate 16, height 6' (1.829 m), weight 76.7 kg, SpO2 100 %. PHYSICAL EXAMINATION:  GENERAL:  68 y.o.-year-old patient lying in the bed with no acute distress. Well-appearing. EYES: Pupils equal, round, reactive to light and accommodation. No scleral icterus. Extraocular muscles intact.  HEENT: Head atraumatic, normocephalic. Oropharynx and nasopharynx clear.  NECK:  Supple, no jugular venous distention. No thyroid enlargement, no tenderness.  LUNGS: Normal breath sounds bilaterally, no wheezing, rales,rhonchi or crepitation. No use of accessory muscles of  respiration.  CARDIOVASCULAR: RRR, S1, S2 normal. No murmurs, rubs, or gallops.  ABDOMEN: Soft, non-tender, non-distended. Bowel sounds present. No organomegaly or mass.  EXTREMITIES: No pedal edema, cyanosis, or clubbing.  NEUROLOGIC: Cranial nerves II through XII are intact. Muscle strength 5/5 in all extremities. Sensation intact. Gait not checked.  PSYCHIATRIC: The patient is alert and oriented x 3.  SKIN: No obvious rash, lesion, or ulcer.  DATA REVIEW:   CBC Recent Labs  Lab 10/20/18 0540  WBC 5.2  HGB 10.3*  HCT 31.4*  PLT 158    Chemistries  Recent Labs  Lab 10/18/18 1528  10/20/18 0540  NA 136   < > 141  K 3.7   < > 4.0  CL 101   < > 108  CO2 22   < > 27  GLUCOSE 120*   < > 103*  BUN 19   < > 15  CREATININE 1.74*   < > 1.50*  CALCIUM 9.1   < > 8.7*  MG  --    < > 1.8  AST 25  --   --   ALT 18  --   --   ALKPHOS 23*  --   --   BILITOT 0.6  --   --    < > = values in this interval not displayed.     Microbiology Results  Results for orders placed or performed during the hospital encounter of 10/18/18  SARS Coronavirus 2 [facility] order, Performed in [facility] hospital lab) Nasopharyngeal Nasopharyngeal Swab     Status: None   Collection Time: 10/18/18  3:41 PM   Specimen: Nasopharyngeal Swab  Result Value Ref Range Status   SARS Coronavirus 2 NEGATIVE NEGATIVE Final    Comment: (NOTE) If result is NEGATIVE SARS-CoV-2 target nucleic acids are NOT DETECTED. The SARS-CoV-2 RNA is generally detectable in upper and lower  respiratory specimens during the acute phase of infection. The lowest  concentration of SARS-CoV-2 viral copies this assay can detect is 250  copies / mL. A negative result does not preclude SARS-CoV-2 infection  and should not be used as the sole basis for treatment or other  patient management decisions.  A negative result may occur with  improper specimen collection / handling, submission of specimen other  than nasopharyngeal swab,  presence of viral mutation(s) within the  areas targeted by this assay, and inadequate number of viral copies  (<250 copies / mL). A negative result must be combined with clinical  observations, patient history, and epidemiological information. If result is POSITIVE SARS-CoV-2 target nucleic acids are DETECTED. The SARS-CoV-2 RNA is generally detectable in upper and lower  respiratory specimens dur ing the acute phase of infection.  Positive  results are indicative of active infection with SARS-CoV-2.  Clinical  correlation with patient history and other diagnostic information is  necessary to determine patient infection status.  Positive results do  not rule out bacterial infection or co-infection with other viruses. If result is PRESUMPTIVE POSTIVE SARS-CoV-2 nucleic acids MAY BE PRESENT.   A presumptive positive result was obtained on the submitted specimen  and confirmed on repeat testing.  While 2019 novel coronavirus  (SARS-CoV-2) nucleic acids may be present in the submitted sample  additional confirmatory testing may be necessary for epidemiological  and / or clinical management purposes  to differentiate between  SARS-CoV-2 and other Sarbecovirus currently known to infect humans.  If clinically indicated additional testing with an alternate test  methodology [id number]) is advised. The SARS-CoV-2 RNA is generally  detectable in upper and lower respiratory sp ecimens during the acute  phase of infection. The expected result is Negative. Fact Sheet for Patients:  BoilerBrush.com.cy Fact Sheet for Healthcare Providers: https://pope.com/ This test is not yet approved or cleared by the [place] FDA and has been authorized for detection and/or diagnosis of SARS-CoV-2 by FDA under an Emergency Use Authorization (EUA).  This EUA will remain in effect (meaning this test can be used) for the duration of the COVID-19 declaration under  Section 564(b)(1) of the Act, 21 U.S.C. section 360bbb-3(b)(1), unless the authorization is terminated or revoked sooner. Performed at [facility], [address] [place], [place] [address]   MRSA PCR Screening     Status: None   Collection Time: 10/18/18  5:44 PM   Specimen: Nasal Mucosa; Nasopharyngeal  Result Value Ref Range Status   MRSA by PCR NEGATIVE NEGATIVE Final    Comment:        The GeneXpert MRSA Assay (FDA approved for NASAL specimens only), is one component of a comprehensive MRSA colonization surveillance program. It is not intended to diagnose MRSA infection nor to guide or monitor treatment for MRSA infections. Performed at [facility], [address] [place], [place] [address]     RADIOLOGY:  No results found.   Management plans discussed with the patient,  family and they are in agreement.  CODE STATUS: Full Code   TOTAL TIME TAKING CARE OF THIS PATIENT: 40 minutes.    Jinny Blossom  M.D on 10/20/2018 at 1:08 PM  Between 7am to 6pm - Pager - [phone]  After 6pm go to [email or web address] - Social research officer, government  [facility]  Office  [phone]  CC: Primary care physician; [facility]   Note: This dictation was prepared with Dragon dictation along with smaller phrase technology. Any transcriptional errors that result from this process are unintentional.

## 2018-10-20 NOTE — Discharge Instructions (Signed)
It was so nice to meet you during this hospitalization!  You came into the hospital with chest pain and firing of your defibrillator. You had an emergent cath done, that did not show any significant blockages. We kept you on an IV medicine called amiodarone and you did not have any more episodes of that funny heart rhythm called ventricular tachycardia.  The cardiologists made some changes to your ICD. Please make sure you follow-up with your cardiologist at the [facility] in the next week.  Take care, Dr. Brett Albino

## 2018-10-20 NOTE — Consult Note (Signed)
ELECTROPHYSIOLOGY CONSULT NOTE  Patient ID: Peter Frey, MRN: [id number], DOB/AGE: April 18, 1950 68 y.o. Admit date: 10/18/2018 Date of Consult: 10/20/2018  Primary Physician: Center, [facility] Primary Cardiologist [place] Peter Frey is a 68 y.o. male who is being seen today for the evaluation of VT at the request of Dr CE.   Chief Complaint: VT   HPI Peter Frey is a 67 y.o. male with a history of ischemic heart disease and prior bypass surgery who has a history of an implanted ICD 2019 at [facility].  Had appropriate therapy today following implantation with multiple shocks for ventricular tachycardia.  Treated temporarily with amiodarone and then discontinued.  He was admitted on this occasion with protracted weakness and decreased mental status.  He was found to be in ventricular tachycardia and was cardioverted.  (He remembers the shock.) Interrogation of his ICD demonstrated 36 minutes of monomorphic ventricular tachycardia below the detection rate at about 330 ms.  He was started again on amiodarone   At baseline he has mild shortness of breath.  No chest pain and no edema.   Past Medical History:  Diagnosis Date  . CHF (congestive heart failure) (HCC)   . COPD (chronic obstructive pulmonary disease) (HCC)   . Coronary artery disease   . MI (myocardial infarction) [facility])       Surgical History:  Past Surgical History:  Procedure Laterality Date  . CARDIAC DEFIBRILLATOR PLACEMENT    . CORONARY ARTERY BYPASS GRAFT    . CORONARY/GRAFT ACUTE MI REVASCULARIZATION N/A 10/18/2018   Procedure: Coronary/Graft Acute MI Revascularization;  Surgeon: Yvonne Kendall, MD;  Location: [facility] INVASIVE CV LAB;  Service: Cardiovascular;  Laterality: N/A;  . INTRAVASCULAR PRESSURE WIRE/FFR STUDY N/A 10/18/2018   Procedure: INTRAVASCULAR PRESSURE WIRE/FFR STUDY;  Surgeon: Yvonne Kendall, MD;  Location: [facility] INVASIVE CV LAB;  Service: Cardiovascular;  Laterality: N/A;  OM  . LEFT HEART  CATH AND CORS/GRAFTS ANGIOGRAPHY N/A 10/18/2018   Procedure: LEFT HEART CATH AND CORS/GRAFTS ANGIOGRAPHY;  Surgeon: Yvonne Kendall, MD;  Location: [facility] INVASIVE CV LAB;  Service: Cardiovascular;  Laterality: N/A;     Home Meds: Prior to Admission medications   Medication Sig Start Date End Date Taking? Authorizing Provider  aspirin 81 MG tablet Take 81 mg by mouth daily.   Yes [provider]  furosemide (LASIX) 40 MG tablet Take 40 mg by mouth daily.    Yes [provider]  gabapentin (NEURONTIN) 300 MG capsule Take 300 mg by mouth 3 (three) times daily.   Yes [provider]  losartan (COZAAR) 50 MG tablet Take 50 mg by mouth daily.    Yes [provider]  metoprolol (LOPRESSOR) 50 MG tablet Take 50 mg by mouth 2 (two) times daily.   Yes [provider]  spironolactone (ALDACTONE) 25 MG tablet Take 25 mg by mouth daily.  08/03/17  Yes [provider]  TIOTROPIUM BROMIDE-OLODATEROL IN Inhale 2 Inhalers into the lungs daily.   Yes [provider]  albuterol (VENTOLIN HFA) 108 (90 Base) MCG/ACT inhaler Inhale into the lungs.    [provider]  sildenafil (VIAGRA) 50 MG tablet Take by mouth.    [provider]  simvastatin (ZOCOR) 20 MG tablet Take 20 mg by mouth daily.    [provider]    Inpatient Medications:  . amiodarone  400 mg Oral BID  . arformoterol  15 mcg Nebulization BID   And  . umeclidinium bromide  1 puff  Inhalation Daily  . aspirin  324 mg Oral Once  . aspirin EC  81 mg Oral Daily  . gabapentin  300 mg Oral TID  . heparin  5,000 Units Subcutaneous Q8H  . metoprolol succinate  25 mg Oral QHS  . simvastatin  20 mg Oral q1800  . sodium chloride flush  3 mL Intravenous Q12H  . spironolactone  25 mg Oral Daily     Allergies:  Allergies  Allergen Reactions  . Lisinopril Swelling    Lip swelling    Social History   Socioeconomic History  . Marital status: Married    Spouse  name: Not on file  . Number of children: Not on file  . Years of education: Not on file  . Highest education level: Not on file  Occupational History  . Not on file  Social Needs  . Financial resource strain: Not on file  . Food insecurity    Worry: Not on file    Inability: Not on file  . Transportation needs    Medical: Not on file    Non-medical: Not on file  Tobacco Use  . Smoking status: Former Smoker    Packs/day: 2.00    Years: 43.00    Pack years: 86.00    Types: Cigarettes    Quit date: 2015    Years since quitting: 5.7  . Smokeless tobacco: Never Used  Substance and Sexual Activity  . Alcohol use: Yes    Comment: occas.   . Drug use: Not Currently    Types: Cocaine, Marijuana    Comment: quit in the 70's.  Marland Kitchen Sexual activity: Not on file  Lifestyle  . Physical activity    Days per week: Not on file    Minutes per session: Not on file  . Stress: Not on file  Relationships  . Social Herbalist on phone: Not on file    Gets together: Not on file    Attends religious service: Not on file    Active member of club or organization: Not on file    Attends meetings of clubs or organizations: Not on file    Relationship status: Not on file  . Intimate partner violence    Fear of current or ex partner: Not on file    Emotionally abused: Not on file    Physically abused: Not on file    Forced sexual activity: Not on file  Other Topics Concern  . Not on file  Social History Narrative  . Not on file     Family History  Problem Relation Age of Onset  . Aneurysm Mother   . Aneurysm Father      ROS:  Please see the history of present illness.    All other systems reviewed and negative.    Physical Exam: Blood pressure 113/68, pulse 68, temperature 98.2 F (36.8 C), temperature source Oral, resp. rate 16, height 6' (1.829 m), weight 76.7 kg, SpO2 100 %. General: Well developed, well nourished male in no acute distress. Head: Normocephalic,  atraumatic, sclera non-icteric, no xanthomas, nares are without discharge. EENT: normal Lymph Nodes:  none Back: without scoliosis/kyphosis, no CVA tendersness Neck: Negative for carotid bruits. JVD not elevated. Lungs: Clear bilaterally to auscultation without wheezes, rales, or rhonchi. Breathing is unlabored. Heart: RRR with S1 S2. No murmur , rubs, or gallops appreciated. Abdomen: Soft, non-tender, non-distended with normoactive bowel sounds. No hepatomegaly. No rebound/guarding. No obvious abdominal masses. Msk:  Strength and tone  appear normal for age. Extremities: No clubbing or cyanosis. No edema.  Distal pedal pulses are 2+ and equal bilaterally. Skin: Warm and Dry Neuro: Alert and oriented X 3. CN III-XII intact Grossly normal sensory and motor function . Psych:  Responds to questions appropriately with a normal affect.      Labs: Cardiac Enzymes No results for input(s): CKTOTAL, CKMB, TROPONINI in the last 72 hours. CBC Lab Results  Component Value Date   WBC 5.2 10/20/2018   HGB 10.3 (L) 10/20/2018   HCT 31.4 (L) 10/20/2018   MCV 72.7 (L) 10/20/2018   PLT 158 10/20/2018   PROTIME: Recent Labs    10/18/18 1528  LABPROT 13.4  INR 1.0   Chemistry  Recent Labs  Lab 10/18/18 1528  10/20/18 0540  NA 136   < > 141  K 3.7   < > 4.0  CL 101   < > 108  CO2 22   < > 27  BUN 19   < > 15  CREATININE 1.74*   < > 1.50*  CALCIUM 9.1   < > 8.7*  PROT 7.5  --   --   BILITOT 0.6  --   --   ALKPHOS 23*  --   --   ALT 18  --   --   AST 25  --   --   GLUCOSE 120*   < > 103*   < > = values in this interval not displayed.   Lipids Lab Results  Component Value Date   CHOL 94 10/18/2018   HDL 50 10/18/2018   LDLCALC 29 10/18/2018   TRIG 73 10/18/2018   BNP No results found for: PROBNP Thyroid Function Tests: No results for input(s): TSH, T4TOTAL, T3FREE, THYROIDAB in the last 72 hours.  Invalid input(s): FREET3    Miscellaneous No results found for: DDIMER   Radiology/Studies:  Dg Chest Port 1 View  Result Date: 10/18/2018 CLINICAL DATA:  Chest pain, dyspnea EXAM: PORTABLE CHEST 1 VIEW COMPARISON:  None. FINDINGS: Intact sternotomy wires. Single lead left subclavian ICD is noted with lead tip overlying the right ventricle. Mild enlargement of the cardiopericardial silhouette. Aortic arch atherosclerosis. Otherwise normal mediastinal contour. No pneumothorax. Trace left pleural effusion/thickening. No right pleural effusion. No pulmonary edema. Low lung volumes. Mild left basilar scarring versus atelectasis. IMPRESSION: Low lung volumes with mild left basilar scarring versus atelectasis. Trace left pleural effusion/thickening. Mild cardiomegaly without pulmonary edema. Electronically Signed   By: Delbert PhenixJason A Poff M.D.   On: 10/18/2018 15:54    EKG: 10/6  Sinus. Interval 22/12/40   Assessment and Plan:  Ventricular tachycardia-monomorphic  Ischemic heart disease with prior bypass  Implantable defibrillator-[employer]    Patient had ventricular tachycardia below VT detection.  We will reprogram the detection from 200--170 bpm.  Will activate for ATP between 170 and 210 and add another ATP for 210 and faster.  We will discontinue the amiodarone.  [place]  [place] restrictions that he ought not to drive for 6 months for sustained ventricular tachycardia    Sherryl MangesSteven

## 2018-10-20 NOTE — Progress Notes (Signed)
Attending Note Patient seen and examined, agree with detailed note above,  Patient presentation and plan discussed on rounds.   Reports feeling well, has ambulated Discussed his presentation with him, Reports that he was awake and fell to the shock given by EMTs on the way to the hospital, " very painful" Also was awake for prior defibrillation 2019 Does not take amiodarone as an outpatient Reports he was on amiodarone for a short period of time but this was discontinued for unclear reasons by [facility] EP -Per the notes he was laying on the bed confused, EMTs found him in VT, amiodarone on route to the hospital, with defibrillation  On examination : alert oriented, no JVD, lungs clear to auscultation bilaterally, heart sounds regular normal S1-S2 no murmurs appreciated, abdomen soft nontender no significant lower extremity edema.  Musculoskeletal exam with good range of motion, neurologic exam grossly nonfocal  Cardiac catheterization reviewed with him, nonobstructive coronary disease, Chronically occluded vein grafts OM, PDA LIMA patent, patent left circumflex stent 50-60% proximal/mid LAD disease, 50% mid LCx and 70% ostial OM1 stenoses (iFR of both = 0.98), and 50% non-dominant proximal RCA lesion.  Lab work reviewed showing potassium 4.0, creatinine 1.5, BUN 15, hematocrit 31  A/P: Sustained VT, Running below detection of his defibrillator Device has been reprogrammed from 200 down to 170 bpm --activate for ATP between 170 and 210 and add another ATP for 210 and faster.  -Driving restrictions discussed by EP  CAD with stable angina Cardiac catheterization results as above Non-smoker, nondiabetic -Cholesterol at goal, LDL 29 on simvastatin -On metoprolol, losartan, Lasix, spironolactone  Cardiomyopathy, ischemic Ejection fraction 30 to 35%, Could consider changing losartan to Entresto Will defer to his outpatient cardiology team He is also on metoprolol tartrate as outpatient,  could consider changing to carvedilol or metoprolol succinate  Long discussion with him concerning results as detailed above, Follow-up instructions Greater than 50% was spent in counseling and coordination of care with patient Total encounter time 35 minutes or more   Signed: Esmond Plants  M.D., Ph.D. [facility]

## 2018-10-25 ENCOUNTER — Telehealth: Payer: Self-pay | Admitting: *Deleted

## 2018-10-25 ENCOUNTER — Encounter: Payer: Self-pay | Admitting: *Deleted

## 2018-10-25 DIAGNOSIS — I429 Cardiomyopathy, unspecified: Secondary | ICD-10-CM

## 2018-10-25 NOTE — Telephone Encounter (Signed)
Called to check on pt.  He was admitted to hospital for vtach with two shocks.   He has a follow up on Friday with his cardiologist.  He is feeling better. He hopes to return next week.

## 2018-11-01 ENCOUNTER — Encounter: Payer: No Typology Code available for payment source | Admitting: *Deleted

## 2018-11-01 ENCOUNTER — Other Ambulatory Visit: Payer: Self-pay

## 2018-11-01 DIAGNOSIS — I255 Ischemic cardiomyopathy: Secondary | ICD-10-CM | POA: Diagnosis present

## 2018-11-01 DIAGNOSIS — I429 Cardiomyopathy, unspecified: Secondary | ICD-10-CM

## 2018-11-01 NOTE — Progress Notes (Signed)
Daily Session Note  Patient Details  Name: Peter Frey MRN: [id number] Date of Birth: [birth year] Referring Provider:     Cardiac Rehab from 08/29/2018 in [facility] Cardiac and Pulmonary Rehab  Referring Provider  Nicholes Mango MD      Encounter Date: 11/01/2018  Check In:      Social History   Tobacco Use  Smoking Status Former Smoker  . Packs/day: 2.00  . Years: 43.00  . Pack years: 86.00  . Types: Cigarettes  . Quit date: 2015  . Years since quitting: 5.8  Smokeless Tobacco Never Used    Goals Met:  Independence with exercise equipment Exercise tolerated well No report of cardiac concerns or symptoms  Goals Unmet:  Not Applicable  Comments: [name] returned to program after being out several weeks for [employer]. His ICD was readjusted to HR of 180.  HIs doctors at the [place] told him to resume his rehab   Dr. Emily Filbert is Medical Director for [place] and [facility].

## 2018-11-03 ENCOUNTER — Encounter: Payer: No Typology Code available for payment source | Admitting: *Deleted

## 2018-11-03 ENCOUNTER — Other Ambulatory Visit: Payer: Self-pay

## 2018-11-03 DIAGNOSIS — I255 Ischemic cardiomyopathy: Secondary | ICD-10-CM | POA: Diagnosis not present

## 2018-11-03 DIAGNOSIS — I429 Cardiomyopathy, unspecified: Secondary | ICD-10-CM

## 2018-11-03 NOTE — Progress Notes (Signed)
Daily Session Note  Patient Details  Name: Peter Frey MRN: [id number] Date of Birth: 04/04/1950 Referring Provider:     Cardiac Rehab from 08/29/2018 in [facility] Cardiac and Pulmonary Rehab  Referring Provider  Patel, Sonal MD      Encounter Date: 11/03/2018  Check In: Session Check In - 11/03/18 1140      Check-In   Supervising physician immediately available to respond to emergencies  See telemetry face sheet for immediately available ER MD    Location  [facility]-Cardiac & Pulmonary Rehab    Staff Present  Susanne Bice, RN, BSN, CCRP;Jeanna Durrell BS, Exercise Physiologist;Joseph Hood RCP,RRT,BSRT    Virtual Visit  No    Medication changes reported      No    Fall or balance concerns reported     No    Warm-up and Cool-down  Performed on first and last piece of equipment    Resistance Training Performed  Yes    VAD Patient?  No    PAD/SET Patient?  No      Pain Assessment   Currently in Pain?  No/denies          Social History   Tobacco Use  Smoking Status Former Smoker  . Packs/day: 2.00  . Years: 43.00  . Pack years: 86.00  . Types: Cigarettes  . Quit date: 2015  . Years since quitting: 5.8  Smokeless Tobacco Never Used    Goals Met:  Independence with exercise equipment Exercise tolerated well No report of cardiac concerns or symptoms  Goals Unmet:  Not Applicable  Comments: Pt able to follow exercise prescription today without complaint.  Will continue to monitor for progression.    Dr. Mark Miller is Medical Director for [facility] and [facility]. 

## 2018-11-08 ENCOUNTER — Encounter: Payer: No Typology Code available for payment source | Admitting: *Deleted

## 2018-11-08 ENCOUNTER — Other Ambulatory Visit: Payer: Self-pay

## 2018-11-08 DIAGNOSIS — I255 Ischemic cardiomyopathy: Secondary | ICD-10-CM | POA: Diagnosis not present

## 2018-11-08 DIAGNOSIS — I429 Cardiomyopathy, unspecified: Secondary | ICD-10-CM

## 2018-11-08 NOTE — Progress Notes (Signed)
Daily Session Note  Patient Details  Name: Peter Frey MRN: [id number] Date of Birth: 09-01-1950 Referring Provider:     Cardiac Rehab from 08/29/2018 in [facility] Cardiac and Pulmonary Rehab  Referring Provider  Nicholes Mango MD      Encounter Date: 11/08/2018  Check In: Session Check In - 11/08/18 0946      Check-In   Supervising physician immediately available to respond to emergencies  See telemetry face sheet for immediately available ER MD    Staff Present  Darel Hong, RN BSN;Joseph Foy [place], [place], ACSM CEP, Exercise Physiologist    Virtual Visit  No    Medication changes reported      No    Fall or balance concerns reported     No    Warm-up and Cool-down  Performed on first and last piece of equipment    Resistance Training Performed  Yes    VAD Patient?  No    PAD/SET Patient?  No      Pain Assessment   Currently in Pain?  No/denies          Social History   Tobacco Use  Smoking Status Former Smoker  . Packs/day: 2.00  . Years: 43.00  . Pack years: 86.00  . Types: Cigarettes  . Quit date: 2015  . Years since quitting: 5.8  Smokeless Tobacco Never Used    Goals Met:  Independence with exercise equipment Exercise tolerated well Personal goals reviewed No report of cardiac concerns or symptoms Strength training completed today  Goals Unmet:  Not Applicable  Comments: Pt able to follow exercise prescription today without complaint.  Will continue to monitor for progression.    Dr. Emily Filbert is Medical Director for [place] and [facility].

## 2018-11-10 ENCOUNTER — Encounter: Payer: No Typology Code available for payment source | Admitting: *Deleted

## 2018-11-10 ENCOUNTER — Other Ambulatory Visit: Payer: Self-pay

## 2018-11-10 DIAGNOSIS — I255 Ischemic cardiomyopathy: Secondary | ICD-10-CM | POA: Diagnosis not present

## 2018-11-10 DIAGNOSIS — I429 Cardiomyopathy, unspecified: Secondary | ICD-10-CM

## 2018-11-10 NOTE — Progress Notes (Signed)
Daily Session Note  Patient Details  Name: Peter Frey MRN: [id number] Date of Birth: Aug 29, 1950 Referring Provider:     Cardiac Rehab from 08/29/2018 in [facility] Cardiac and Pulmonary Rehab  Referring Provider  Nicholes Mango MD      Encounter Date: 11/10/2018  Check In: Session Check In - 11/10/18 1015      Check-In   Supervising physician immediately available to respond to emergencies  See telemetry face sheet for immediately available ER MD    Location  [facility]-Cardiac & Pulmonary Rehab    Staff Present  Heath Lark, RN, BSN, CCRP;Meredith Sherryll Burger, RN BSN;Jeanna Durrell BS, Exercise Physiologist;Amanda Oletta Darter, BA, ACSM CEP, Exercise Physiologist    Virtual Visit  No    Medication changes reported      No    Fall or balance concerns reported     No    Warm-up and Cool-down  Performed on first and last piece of equipment    Resistance Training Performed  Yes    VAD Patient?  No    PAD/SET Patient?  No      Pain Assessment   Currently in Pain?  No/denies          Social History   Tobacco Use  Smoking Status Former Smoker  . Packs/day: 2.00  . Years: 43.00  . Pack years: 86.00  . Types: Cigarettes  . Quit date: 2015  . Years since quitting: 5.8  Smokeless Tobacco Never Used    Goals Met:  Independence with exercise equipment Exercise tolerated well No report of cardiac concerns or symptoms  Goals Unmet:  Not Applicable  Comments: Pt able to follow exercise prescription today without complaint.  Will continue to monitor for progression.    Dr. Emily Filbert is Medical Director for [place] and [facility].

## 2018-11-15 ENCOUNTER — Other Ambulatory Visit: Payer: Self-pay

## 2018-11-15 ENCOUNTER — Encounter: Payer: No Typology Code available for payment source | Attending: Internal Medicine | Admitting: *Deleted

## 2018-11-15 DIAGNOSIS — I429 Cardiomyopathy, unspecified: Secondary | ICD-10-CM

## 2018-11-15 DIAGNOSIS — I255 Ischemic cardiomyopathy: Secondary | ICD-10-CM | POA: Insufficient documentation

## 2018-11-15 NOTE — Progress Notes (Signed)
Daily Session Note  Patient Details  Name: Peter Frey MRN: [id number] Date of Birth: December 12, 1950 Referring Provider:     Cardiac Rehab from 08/29/2018 in [facility] Cardiac and Pulmonary Rehab  Referring Provider  Peter Mango MD      Encounter Date: 11/15/2018  Check In: Session Check In - 11/15/18 1022      Check-In   Supervising physician immediately available to respond to emergencies  See telemetry face sheet for immediately available ER MD    Location  [facility]-Cardiac & Pulmonary Rehab    Staff Present  Heath Lark, RN, BSN, CCRP;Amanda Sommer, BA, ACSM CEP, Exercise Physiologist    Virtual Visit  No    Medication changes reported      No    Fall or balance concerns reported     No    Warm-up and Cool-down  Performed on first and last piece of equipment    Resistance Training Performed  Yes    VAD Patient?  No    PAD/SET Patient?  No      Pain Assessment   Currently in Pain?  No/denies          Social History   Tobacco Use  Smoking Status Former Smoker  . Packs/day: 2.00  . Years: 43.00  . Pack years: 86.00  . Types: Cigarettes  . Quit date: 2015  . Years since quitting: 5.8  Smokeless Tobacco Never Used    Goals Met:  Independence with exercise equipment Exercise tolerated well No report of cardiac concerns or symptoms  Goals Unmet:  Not Applicable  Comments: Pt able to follow exercise prescription today without complaint.  Will continue to monitor for progression.    Dr. Emily Frey is Medical Director for [facility] and [facility].

## 2018-11-16 ENCOUNTER — Encounter: Payer: Self-pay | Admitting: *Deleted

## 2018-11-16 DIAGNOSIS — I429 Cardiomyopathy, unspecified: Secondary | ICD-10-CM

## 2018-11-16 NOTE — Progress Notes (Signed)
Cardiac Individual Treatment Plan  Patient Details  Name: Peter Frey MRN: [id number] Date of Birth: 01/28/1950 Referring Provider:     Cardiac Rehab from 08/29/2018 in [facility]  Referring Provider  Patel, Sonal MD      Initial Encounter Date:    Cardiac Rehab from 08/29/2018 in [facility]  Date  08/29/18      Visit Diagnosis: Cardiomyopathy, unspecified type (HCC)  Patient's Home Medications on Admission:  Current Outpatient Medications:  .  albuterol (VENTOLIN HFA) 108 (90 Base) MCG/ACT inhaler, Inhale into the lungs., Disp: , Rfl:  .  aspirin 81 MG tablet, Take 81 mg by mouth daily., Disp: , Rfl:  .  furosemide (LASIX) 40 MG tablet, Take 40 mg by mouth daily. , Disp: , Rfl:  .  gabapentin (NEURONTIN) 300 MG capsule, Take 300 mg by mouth 3 (three) times daily., Disp: , Rfl:  .  losartan (COZAAR) 50 MG tablet, Take 50 mg by mouth daily. , Disp: , Rfl:  .  metoprolol (LOPRESSOR) 50 MG tablet, Take 50 mg by mouth 2 (two) times daily., Disp: , Rfl:  .  sildenafil (VIAGRA) 50 MG tablet, Take by mouth., Disp: , Rfl:  .  simvastatin (ZOCOR) 20 MG tablet, Take 20 mg by mouth daily., Disp: , Rfl:  .  spironolactone (ALDACTONE) 25 MG tablet, Take 25 mg by mouth daily. , Disp: , Rfl:  .  TIOTROPIUM BROMIDE-OLODATEROL IN, Inhale 2 Inhalers into the lungs daily., Disp: , Rfl:   Past Medical History: Past Medical History:  Diagnosis Date  . CHF (congestive heart failure) (HCC)   . COPD (chronic obstructive pulmonary disease) (HCC)   . Coronary artery disease   . MI (myocardial infarction) (HCC)     Tobacco Use: Social History   Tobacco Use  Smoking Status Former Smoker  . Packs/day: 2.00  . Years: 43.00  . Pack years: 86.00  . Types: Cigarettes  . Quit date: 2015  . Years since quitting: 5.8  Smokeless Tobacco Never Used    Labs: Recent Review Flowsheet Data    Labs for ITP Cardiac and Pulmonary Rehab Latest Ref Rng & Units  10/18/2018   Cholestrol 0 - 200 mg/dL 94   LDLCALC 0 - 99 mg/dL 29   HDL >40 mg/dL 50   Trlycerides <150 mg/dL 73       Exercise Target Goals: Exercise Program Goal: Individual exercise prescription set using results from initial 6 min walk test and THRR while considering  patient's activity barriers and safety.   Exercise Prescription Goal: Initial exercise prescription builds to 30-45 minutes a day of aerobic activity, 2-3 days per week.  Home exercise guidelines will be given to patient during program as part of exercise prescription that the participant will acknowledge.  Activity Barriers & Risk Stratification: Activity Barriers & Cardiac Risk Stratification - 08/29/18 1216      Activity Barriers & Cardiac Risk Stratification   Activity Barriers  Shortness of Breath;Deconditioning;Muscular Weakness    Cardiac Risk Stratification  High       6 Minute Walk: 6 Minute Walk    Row Name 08/29/18 1216         6 Minute Walk   Phase  Initial     Distance  1270 feet     Walk Time  6 minutes     # of Rest Breaks  0     MPH  2.41     METS  3.35       RPE  12     Perceived Dyspnea   2     VO2 Peak  11.72     Symptoms  Yes (comment)     Comments  Calf pain 9/10, SOB     Resting HR  71 bpm     Resting BP  124/62     Resting Oxygen Saturation   99 %     Exercise Oxygen Saturation  during 6 min walk  95 %     Max Ex. HR  95 bpm     Max Ex. BP  128/64     2 Minute Post BP  108/64        Oxygen Initial Assessment:   Oxygen Re-Evaluation:   Oxygen Discharge (Final Oxygen Re-Evaluation):   Initial Exercise Prescription: Initial Exercise Prescription - 08/29/18 1200      Date of Initial Exercise RX and Referring Provider   Date  08/29/18    Referring Provider  Patel, Sonal MD      Treadmill   MPH  2.3    Grade  0.5    Minutes  15    METs  2.92      Recumbant Bike   Level  2    RPM  50    Watts  33    Minutes  15    METs  2.5      NuStep   Level  2     SPM  80    Minutes  15    METs  2.5      Recumbant Elliptical   Level  1    RPM  50    Minutes  15    METs  2.5      Prescription Details   Frequency (times per week)  2    Duration  Progress to 30 minutes of continuous aerobic without signs/symptoms of physical distress      Intensity   THRR 40-80% of Max Heartrate  104-137    Ratings of Perceived Exertion  11-13    Perceived Dyspnea  0-4      Progression   Progression  Continue to progress workloads to maintain intensity without signs/symptoms of physical distress.      Resistance Training   Training Prescription  Yes    Weight  4 lbs    Reps  10-15       Perform Capillary Blood Glucose checks as needed.  Exercise Prescription Changes: Exercise Prescription Changes    Row Name 08/29/18 1200 09/02/18 1000 09/07/18 1100 10/06/18 1000 10/25/18 1000     Response to Exercise   Blood Pressure (Admit)  124/62  106/66  100/58  -  122/64   Blood Pressure (Exercise)  128/64  130/64  132/80  -  124/64   Blood Pressure (Exit)  108/64  110/70  132/70  -  108/56   Heart Rate (Admit)  71 bpm  80 bpm  69 bpm  -  70 bpm   Heart Rate (Exercise)  95 bpm  101 bpm  104 bpm  -  95 bpm   Heart Rate (Exit)  71 bpm  70 bpm  66 bpm  -  76 bpm   Oxygen Saturation (Admit)  99 %  -  -  -  -   Oxygen Saturation (Exercise)  95 %  -  -  -  -   Rating of Perceived Exertion (Exercise)  12  12  14  -  12     Perceived Dyspnea (Exercise)  2  -  -  -  -   Symptoms  calves buring 9/10, SOB  -  -  -  none   Comments  walk test results  -  -  -  -   Duration  -  Progress to 30 minutes of  aerobic without signs/symptoms of physical distress  Continue with 30 min of aerobic exercise without signs/symptoms of physical distress.  -  Continue with 30 min of aerobic exercise without signs/symptoms of physical distress.   Intensity  -  THRR unchanged  THRR unchanged  -  THRR unchanged     Progression   Progression  -  Continue to progress workloads to maintain  intensity without signs/symptoms of physical distress.  Continue to progress workloads to maintain intensity without signs/symptoms of physical distress.  -  Continue to progress workloads to maintain intensity without signs/symptoms of physical distress.   Average METs  -  2.4  2.9  -  2.68     Resistance Training   Training Prescription  -  Yes  Yes  -  Yes   Weight  -  4 lbs  4 lb  -  4 lbs   Reps  -  10-15  10-15  -  10-15     Interval Training   Interval Training  -  -  No  -  No     Treadmill   MPH  -  2  2  -  2.5   Grade  -  0  0  -  0   Minutes  -  15  15  -  15   METs  -  2.5  2.5  -  2.91     NuStep   Level  -  2  5  -  5   SPM  -  80  80  -  -   Minutes  -  15  15  -  15   METs  -  2.9  3.3  -  2.7     Recumbant Elliptical   Level  -  1  -  -  2.5   RPM  -  50  -  -  -   Minutes  -  15  -  -  15   METs  -  1.6  -  -  2.2     Home Exercise Plan   Plans to continue exercise at  -  -  -  Home (comment) considering gym  Home (comment) considering gym   Frequency  -  -  -  Add 2 additional days to program exercise sessions.  Add 2 additional days to program exercise sessions.   Initial Home Exercises Provided  -  -  -  10/06/18  10/06/18   Row Name 11/03/18 1600             Response to Exercise   Blood Pressure (Admit)  110/70       Blood Pressure (Exercise)  138/62       Blood Pressure (Exit)  100/62       Heart Rate (Admit)  68 bpm       Heart Rate (Exercise)  106 bpm       Heart Rate (Exit)  67 bpm       Rating of Perceived Exertion (Exercise)  13       Symptoms  none         Duration  Continue with 30 min of aerobic exercise without signs/symptoms of physical distress.       Intensity  THRR unchanged         Progression   Progression  Continue to progress workloads to maintain intensity without signs/symptoms of physical distress.       Average METs  2.5         Resistance Training   Training Prescription  Yes       Weight  4 lb       Reps  10-15          Interval Training   Interval Training  No         Treadmill   MPH  2       Grade  0       Minutes  15       METs  2.53         NuStep   Level  4       Minutes  15       METs  2.3 T5 - alternate station         Home Exercise Plan   Plans to continue exercise at  Home (comment) considering gym       Frequency  Add 2 additional days to program exercise sessions.       Initial Home Exercises Provided  10/06/18          Exercise Comments: Exercise Comments    Row Name 08/30/18 1012 09/23/18 1301         Exercise Comments  First full day of exercise!  Patient was oriented to gym and equipment including functions, settings, policies, and procedures.  Patient's individual exercise prescription and treatment plan were reviewed.  All starting workloads were established based on the results of the 6 minute walk test done at initial orientation visit.  The plan for exercise progression was also introduced and progression will be customized based on patient's performance and goals.  David has been out since last review due to illness         Exercise Goals and Review: Exercise Goals    Row Name 08/29/18 1241             Exercise Goals   Increase Physical Activity  Yes       Intervention  Provide advice, education, support and counseling about physical activity/exercise needs.;Develop an individualized exercise prescription for aerobic and resistive training based on initial evaluation findings, risk stratification, comorbidities and participant's personal goals.       Expected Outcomes  Short Term: Attend rehab on a regular basis to increase amount of physical activity.;Long Term: Add in home exercise to make exercise part of routine and to increase amount of physical activity.;Long Term: Exercising regularly at least 3-5 days a week.       Increase Strength and Stamina  Yes       Intervention  Provide advice, education, support and counseling about physical activity/exercise  needs.;Develop an individualized exercise prescription for aerobic and resistive training based on initial evaluation findings, risk stratification, comorbidities and participant's personal goals.       Expected Outcomes  Short Term: Increase workloads from initial exercise prescription for resistance, speed, and METs.;Short Term: Perform resistance training exercises routinely during rehab and add in resistance training at home;Long Term: Improve cardiorespiratory fitness, muscular endurance and strength as measured by increased METs and functional capacity (6MWT)       Able to understand and   use rate of perceived exertion (RPE) scale  Yes       Intervention  Provide education and explanation on how to use RPE scale       Expected Outcomes  Short Term: Able to use RPE daily in rehab to express subjective intensity level;Long Term:  Able to use RPE to guide intensity level when exercising independently       Able to understand and use Dyspnea scale  Yes       Intervention  Provide education and explanation on how to use Dyspnea scale       Expected Outcomes  Short Term: Able to use Dyspnea scale daily in rehab to express subjective sense of shortness of breath during exertion;Long Term: Able to use Dyspnea scale to guide intensity level when exercising independently       Knowledge and understanding of Target Heart Rate Range (THRR)  Yes       Intervention  Provide education and explanation of THRR including how the numbers were predicted and where they are located for reference       Expected Outcomes  Short Term: Able to state/look up THRR;Short Term: Able to use daily as guideline for intensity in rehab;Long Term: Able to use THRR to govern intensity when exercising independently       Able to check pulse independently  Yes       Intervention  Provide education and demonstration on how to check pulse in carotid and radial arteries.;Review the importance of being able to check your own pulse for safety  during independent exercise       Expected Outcomes  Short Term: Able to explain why pulse checking is important during independent exercise;Long Term: Able to check pulse independently and accurately       Understanding of Exercise Prescription  Yes       Intervention  Provide education, explanation, and written materials on patient's individual exercise prescription       Expected Outcomes  Short Term: Able to explain program exercise prescription;Long Term: Able to explain home exercise prescription to exercise independently          Exercise Goals Re-Evaluation : Exercise Goals Re-Evaluation    [place] Name 08/30/18 1012 09/02/18 1024 09/07/18 1111 10/25/18 1044 11/03/18 1614     Exercise Goal Re-Evaluation   Exercise Goals Review  Increase Physical Activity;Increase Strength and Stamina;Able to understand and use rate of perceived exertion (RPE) scale;Able to understand and use Dyspnea scale;Knowledge and understanding of Target Heart Rate Range (THRR);Able to check pulse independently;Understanding of Exercise Prescription  Increase Physical Activity;Increase Strength and Stamina;Able to understand and use rate of perceived exertion (RPE) scale;Knowledge and understanding of Target Heart Rate Range (THRR);Able to check pulse independently;Understanding of Exercise Prescription  Increase Physical Activity;Increase Strength and Stamina;Able to understand and use rate of perceived exertion (RPE) scale;Knowledge and understanding of Target Heart Rate Range (THRR);Able to check pulse independently;Understanding of Exercise Prescription  -  Increase Physical Activity;Increase Strength and Stamina;Able to understand and use rate of perceived exertion (RPE) scale;Knowledge and understanding of Target Heart Rate Range (THRR);Able to check pulse independently;Understanding of Exercise Prescription   Comments  Reviewed RPE scale, THR and program prescription with pt today.  Pt voiced understanding and was given  a copy of goals to take home.  [name] has done well in his first week of HT.  He has a very positive attitude and seems to enjoy exercise.  The TM has been most challenging  [name] has  increased level on NS.  Staff will continue to encourage progress.  David has been out since 9/24.  This is Davids first week back in almost a month.  He is building  back to previous levels.   Expected Outcomes  Short: Use RPE daily to regulate intensity. Long: Follow program prescription in THR.  Short - attend HT consistently Long - improve overall MET level  Short - increase TM level Long - improve overall MET level  -  Short - get back on track consistently Long - increase MET level   Row Name 11/10/18 0955             Exercise Goal Re-Evaluation   Exercise Goals Review  Increase Physical Activity;Increase Strength and Stamina;Understanding of Exercise Prescription       Comments  David has been doing well in rehab.  He has not done much at home this month with everything that is going on.  He is still having some SOB.  He has not noticed an improvement with strength and stamina yet.  He is able to do what he wants but modifies it some.       Expected Outcomes  Short: Get back to exercising on off days.  Long: Continue to improve stamina.          Discharge Exercise Prescription (Final Exercise Prescription Changes): Exercise Prescription Changes - 11/03/18 1600      Response to Exercise   Blood Pressure (Admit)  110/70    Blood Pressure (Exercise)  138/62    Blood Pressure (Exit)  100/62    Heart Rate (Admit)  68 bpm    Heart Rate (Exercise)  106 bpm    Heart Rate (Exit)  67 bpm    Rating of Perceived Exertion (Exercise)  13    Symptoms  none    Duration  Continue with 30 min of aerobic exercise without signs/symptoms of physical distress.    Intensity  THRR unchanged      Progression   Progression  Continue to progress workloads to maintain intensity without signs/symptoms of physical distress.     Average METs  2.5      Resistance Training   Training Prescription  Yes    Weight  4 lb    Reps  10-15      Interval Training   Interval Training  No      Treadmill   MPH  2    Grade  0    Minutes  15    METs  2.53      NuStep   Level  4    Minutes  15    METs  2.3   T5 - alternate station     Home Exercise Plan   Plans to continue exercise at  Home (comment)   considering gym   Frequency  Add 2 additional days to program exercise sessions.    Initial Home Exercises Provided  10/06/18       Nutrition:  Target Goals: Understanding of nutrition guidelines, daily intake of sodium <1500mg, cholesterol <200mg, calories 30% from fat and 7% or less from saturated fats, daily to have 5 or more servings of fruits and vegetables.  Biometrics: Pre Biometrics - 08/29/18 1242      Pre Biometrics   Height  6' (1.829 m)    Weight  172 lb 8 oz (78.2 kg)    BMI (Calculated)  23.39    Single Leg Stand  30 seconds          Nutrition Therapy Plan and Nutrition Goals: Nutrition Therapy & Goals - 08/29/18 1200      Nutrition Therapy   Diet  low Na, HH diet    Protein (specify units)  62g    Fiber  30 grams    Whole Grain Foods  3 servings    Saturated Fats  12 max. grams    Fruits and Vegetables  5 servings/day    Sodium  1.5 grams      Personal Nutrition Goals   Nutrition Goal  ST: when pt does not have breakfast, have mid-morning protein snack LT: gain energy,  reduce SOB    Comments  Pt reports eating soul food mostly. He reports loving vegetables, disliking bread, eating beans and chicken (mostly baked, but with skin- will sometimes have fried chicken), likes to use butter and thosand island dressing - will have salads a couple times a week, will have fish 1x/week, 1 egg 2-3x/week, B (eggs, cheese and sausage) a couple times a week. Pt will normall have just dinner around 3pm (if pt has snack too close to dinner will not eat) - discussed a protein rich mid-morning snack  (discussed healthy options). Pt reports that he drinks mountain dew mostly during the day. Discussed HH eating.      Intervention Plan   Intervention  Prescribe, educate and counsel regarding individualized specific dietary modifications aiming towards targeted core components such as weight, hypertension, lipid management, diabetes, heart failure and other comorbidities.;Nutrition handout(s) given to patient.    Expected Outcomes  Short Term Goal: Understand basic principles of dietary content, such as calories, fat, sodium, cholesterol and nutrients.;Short Term Goal: A plan has been developed with personal nutrition goals set during dietitian appointment.;Long Term Goal: Adherence to prescribed nutrition plan.       Nutrition Assessments: Nutrition Assessments - 08/29/18 1200      MEDFICTS Scores   Pre Score  43       Nutrition Goals Re-Evaluation: Nutrition Goals Re-Evaluation    Row Name 10/04/18 1016 11/08/18 1015 11/15/18 1138         Goals   Current Weight  -  169 lb (76.7 kg)  -     Nutrition Goal  ST: when pt does not have breakfast, have mid-morning protein snack LT: gain energy,  reduce SOB  Maintain weight. Drink more water.  Maintain weight. Drink more water.     Comment  Pt reports eating the same way since the last time we spoke, discussed the importance of getting enough protein and mechanical eating when he is not hungry.  Patient states that he does not drink much water. He has at least 3 regular Mountain Dews a day. He does not check his blood sugar at home. He is eating more baked goods and intaking more vegetables. Spokes to patient about his low blood pressure and informed him that he may need to drink more water.  Continue with current changes     Expected Outcome  ST: when pt does not have breakfast, have mid-morning protein snack LT: gain energy,  reduce SOB  Short:cut back on mountain dew and drink water. Long: maintain drinking water and drink mountain dew  seldome.  Short:cut back on mountain dew and drink water. Long: maintain drinking water and drink mountain dew seldome.        Nutrition Goals Discharge (Final Nutrition Goals Re-Evaluation): Nutrition Goals Re-Evaluation - 11/15/18 1138      Goals   Nutrition Goal  Maintain weight. Drink   more water.    Comment  Continue with current changes    Expected Outcome  Short:cut back on mountain dew and drink water. Long: maintain drinking water and drink mountain dew seldome.       Psychosocial: Target Goals: Acknowledge presence or absence of significant depression and/or stress, maximize coping skills, provide positive support system. Participant is able to verbalize types and ability to use techniques and skills needed for reducing stress and depression.   Initial Review & Psychosocial Screening: Initial Psych Review & Screening - 08/25/18 1418      Initial Review   Current issues with  None Identified      Family Dynamics   Good Support System?  Yes   Wife and family     Barriers   Psychosocial barriers to participate in program  There are no identifiable barriers or psychosocial needs.;The patient should benefit from training in stress management and relaxation.      Screening Interventions   Interventions  Encouraged to exercise;To provide support and resources with identified psychosocial needs;Provide feedback about the scores to participant    Expected Outcomes  Short Term goal: Utilizing psychosocial counselor, staff and physician to assist with identification of specific Stressors or current issues interfering with healing process. Setting desired goal for each stressor or current issue identified.;Long Term Goal: Stressors or current issues are controlled or eliminated.;Short Term goal: Identification and review with participant of any Quality of Life or Depression concerns found by scoring the questionnaire.;Long Term goal: The participant improves quality of Life and PHQ9  Scores as seen by post scores and/or verbalization of changes       Quality of Life Scores:  Quality of Life - 08/29/18 1242      Quality of Life   Select  Quality of Life      Quality of Life Scores   Health/Function Pre  15.07 %    Socioeconomic Pre  20.36 %    Psych/Spiritual Pre  23.57 %    Family Pre  28.8 %    GLOBAL Pre  20.08 %      Scores of 19 and below usually indicate a poorer quality of life in these areas.  A difference of  2-3 points is a clinically meaningful difference.  A difference of 2-3 points in the total score of the Quality of Life Index has been associated with significant improvement in overall quality of life, self-image, physical symptoms, and general health in studies assessing change in quality of life.  PHQ-9: Recent Review Flowsheet Data    Depression screen PHQ 2/9 08/29/2018   Decreased Interest 0   Down, Depressed, Hopeless 0   PHQ - 2 Score 0   Altered sleeping 0   Tired, decreased energy 3   Change in appetite 0   Feeling bad or failure about yourself  0   Trouble concentrating 0   Moving slowly or fidgety/restless 0   Suicidal thoughts 0   PHQ-9 Score 3   Difficult doing work/chores Not difficult at all     Interpretation of Total Score  Total Score Depression Severity:  1-4 = Minimal depression, 5-9 = Mild depression, 10-14 = Moderate depression, 15-19 = Moderately severe depression, 20-27 = Severe depression   Psychosocial Evaluation and Intervention: Psychosocial Evaluation - 08/25/18 1426      Psychosocial Evaluation & Interventions   Interventions  Encouraged to exercise with the program and follow exercise prescription    Comments  Teagen has no barriers to entering   the program. He is eager to start the program so he can build up his stamina.  He stated that he has seen a psychiatrist this year  and reports he does not feel depressed. He states he is doing fine dealing with the fact he has heart disease and the severity of his  disease.    Expected Outcomes  Ejay will continue to deal well with his heart disease.  He will be able to benefit from the program to help him feel stronger and more able to meet his daily routines.    Continue Psychosocial Services   Follow up required by staff       Psychosocial Re-Evaluation: Psychosocial Re-Evaluation    Row Name 11/08/18 1005 11/10/18 0946           Psychosocial Re-Evaluation   Current issues with  Current Sleep Concerns;Current Anxiety/Panic  Current Stress Concerns;Current Anxiety/Panic;Current Sleep Concerns      Comments  Patient states he has alot on his mind. He had found his brother passed away last month. He has alot on his mind with his health and when he had his heart cardioverted. Drayden does not want to take medications for anxiety. He does not want them to alter what he is thinking. If he is not busy he starts thinking about his health more and dwells on the past.  [facility]  He is also still coping with finding his brother dead and now having his own heart problems.  He is doing his best to make it through, this month has just been tough physcially and mentally.  He is still not taking his anxiety meds as he feels that he takes enough other stuff.  He continues to stay busy to keep his mind off things.  His wife is his support system and keeps an eye on him.  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 He is still not sleeping well.  He was watching TV and was asleep by 9 but woke at 1130 pm and has been up since then.  He usually will take a nap in the afternoon.  We talked about keeping naps short and not napping after 3pm.      Expected Outcomes  Short: Attend [facility] stress management education to decrease stress. Long: Maintain exercise Post HeartTrack to keep stress at a minimum.  [facility]  Long: Continue to cope well.      Interventions   Encouraged to attend Cardiac Rehabilitation for the exercise  Encouraged to attend Cardiac Rehabilitation for the exercise;Stress management education      Continue Psychosocial Services   Follow up required by staff  Follow up required by staff        Initial Review   Source of Stress Concerns  -  Chronic Illness;Unable to perform yard/household activities;Unable to participate in former interests or hobbies         Psychosocial Discharge (Final Psychosocial Re-Evaluation): Psychosocial Re-Evaluation - 11/10/18 0946      Psychosocial Re-Evaluation   Current issues with  Current Stress Concerns;Current Anxiety/Panic;Current Sleep Concerns    Comments  [facility]  He is also still coping with finding his brother dead and now having his own heart problems.  He is doing his best to make it through, this month has just been tough physcially and mentally.  He   is still not taking his anxiety meds as he feels that he takes enough other stuff.  He continues to stay busy to keep his mind off things.  His wife is his support system and keeps an eye on him.  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 He is still not sleeping well.  He was watching TV and was asleep by 9 but woke at 1130 pm and has been up since then.  He usually will take a nap in the afternoon.  We talked about keeping naps short and not napping after 3pm.    Expected Outcomes  Short: Hear what [facility] says about test from Wednesday.  Long: Continue to cope well.    Interventions  Encouraged to attend Cardiac Rehabilitation for the exercise;Stress management education    Continue Psychosocial Services   Follow up required by staff      Initial Review   Source of Stress Concerns  Chronic Illness;Unable to perform yard/household activities;Unable to participate in former interests or hobbies       Vocational Rehabilitation: Provide vocational rehab assistance to  qualifying candidates.   Vocational Rehab Evaluation & Intervention: Vocational Rehab - 08/25/18 1421      Initial Vocational Rehab Evaluation & Intervention   Assessment shows need for Vocational Rehabilitation  No       Education: Education Goals: Education classes will be provided on a variety of topics geared toward better understanding of heart health and risk factor modification. Participant will state understanding/return demonstration of topics presented as noted by education test scores.  Learning Barriers/Preferences: Learning Barriers/Preferences - 08/25/18 1421      Learning Barriers/Preferences   Learning Barriers  None    Learning Preferences  None       Education Topics:  AED/CPR: - Group verbal and written instruction with the use of models to demonstrate the basic use of the AED with the basic ABC's of resuscitation.   General Nutrition Guidelines/Fats and Fiber: -Group instruction provided by verbal, written material, models and posters to present the general guidelines for heart healthy nutrition. Gives an explanation and review of dietary fats and fiber.   Controlling Sodium/Reading Food Labels: -Group verbal and written material supporting the discussion of sodium use in heart healthy nutrition. Review and explanation with models, verbal and written materials for utilization of the food label.   Exercise Physiology & General Exercise Guidelines: - Group verbal and written instruction with models to review the exercise physiology of the cardiovascular system and associated critical values. Provides general exercise guidelines with specific guidelines to those with heart or lung disease.    Aerobic Exercise & Resistance Training: - Gives group verbal and written instruction on the various components of exercise. Focuses on aerobic and resistive training programs and the benefits of this training and how to safely progress through these programs..   Cardiac  Rehab from 11/03/2018 in [facility] Cardiac and Pulmonary Rehab  Date  11/03/18  Educator  [facility]  Instruction Review Code  1- Verbalizes Understanding      Flexibility, Balance, Mind/Body Relaxation: Provides group verbal/written instruction on the benefits of flexibility and balance training, including mind/body exercise modes such as yoga, pilates and tai chi.  Demonstration and skill practice provided.   Stress and Anxiety: - Provides group verbal and written instruction about the health risks of elevated stress and causes of high stress.  Discuss the correlation between heart/lung disease and anxiety and treatment options. Review healthy ways  to manage with stress and anxiety.   Depression: - Provides group verbal and written instruction on the correlation between heart/lung disease and depressed mood, treatment options, and the stigmas associated with seeking treatment.   Anatomy & Physiology of the Heart: - Group verbal and written instruction and models provide basic cardiac anatomy and physiology, with the coronary electrical and arterial systems. Review of Valvular disease and Heart Failure   Cardiac Procedures: - Group verbal and written instruction to review commonly prescribed medications for heart disease. Reviews the medication, class of the drug, and side effects. Includes the steps to properly store meds and maintain the prescription regimen. (beta blockers and nitrates)   Cardiac Medications I: - Group verbal and written instruction to review commonly prescribed medications for heart disease. Reviews the medication, class of the drug, and side effects. Includes the steps to properly store meds and maintain the prescription regimen.   Cardiac Medications II: -Group verbal and written instruction to review commonly prescribed medications for heart disease. Reviews the medication, class of the drug, and side effects. (all other drug classes)   Cardiac Rehab from 11/03/2018 in  [facility] Cardiac and Pulmonary Rehab  Date  11/03/18  Educator  [facility]  Instruction Review Code  1- Verbalizes Understanding       Go Sex-Intimacy & Heart Disease, Get SMART - Goal Setting: - Group verbal and written instruction through game format to discuss heart disease and the return to sexual intimacy. Provides group verbal and written material to discuss and apply goal setting through the application of the S.M.A.R.T. Method.   Other Matters of the Heart: - Provides group verbal, written materials and models to describe Stable Angina and Peripheral Artery. Includes description of the disease process and treatment options available to the cardiac patient.   Exercise & Equipment Safety: - Individual verbal instruction and demonstration of equipment use and safety with use of the equipment.   Cardiac Rehab from 08/29/2018 in [facility] Cardiac and Pulmonary Rehab  Date  08/29/18  Educator  [facility]  Instruction Review Code  1- Verbalizes Understanding      Infection Prevention: - Provides verbal and written material to individual with discussion of infection control including proper hand washing and proper equipment cleaning during exercise session.   Cardiac Rehab from 08/29/2018 in [facility] Cardiac and Pulmonary Rehab  Date  08/29/18  Educator  [facility]  Instruction Review Code  1- Verbalizes Understanding      Falls Prevention: - Provides verbal and written material to individual with discussion of falls prevention and safety.   Cardiac Rehab from 08/29/2018 in [facility] Cardiac and Pulmonary Rehab  Date  08/29/18  Educator  [facility]  Instruction Review Code  1- Verbalizes Understanding      Diabetes: - Individual verbal and written instruction to review signs/symptoms of diabetes, desired ranges of glucose level fasting, after meals and with exercise. Acknowledge that pre and post exercise glucose checks will be done for 3 sessions at entry of program.   Know Your Numbers and Risk Factors: -Group verbal  and written instruction about important numbers in your health.  Discussion of what are risk factors and how they play a role in the disease process.  Review of Cholesterol, Blood Pressure, Diabetes, and BMI and the role they play in your overall health.   Cardiac Rehab from 11/03/2018 in [facility] Cardiac and Pulmonary Rehab  Date  11/03/18  Educator  [facility]  Instruction Review Code  1- Verbalizes Understanding      Sleep   Hygiene: -Provides group verbal and written instruction about how sleep can affect your health.  Define sleep hygiene, discuss sleep cycles and impact of sleep habits. Review good sleep hygiene tips.    Other: -Provides group and verbal instruction on various topics (see comments)   Knowledge Questionnaire Score: Knowledge Questionnaire Score - 08/29/18 1243      Knowledge Questionnaire Score   Pre Score  20/26   Education Focus: Angina, Heart Failure, Nutrition, Exercise      Core Components/Risk Factors/Patient Goals at Admission: Personal Goals and Risk Factors at Admission - 08/29/18 1244      Core Components/Risk Factors/Patient Goals on Admission    Weight Management  Yes;Weight Maintenance    Intervention  Weight Management: Develop a combined nutrition and exercise program designed to reach desired caloric intake, while maintaining appropriate intake of nutrient and fiber, sodium and fats, and appropriate energy expenditure required for the weight goal.;Weight Management: Provide education and appropriate resources to help participant work on and attain dietary goals.    Admit Weight  172 lb 8 oz (78.2 kg)    Goal Weight: Short Term  172 lb (78 kg)    Goal Weight: Long Term  172 lb (78 kg)    Expected Outcomes  Short Term: Continue to assess and modify interventions until short term weight is achieved;Long Term: Adherence to nutrition and physical activity/exercise program aimed toward attainment of established weight goal;Weight Maintenance: Understanding of the  daily nutrition guidelines, which includes 25-35% calories from fat, 7% or less cal from saturated fats, less than 280m cholesterol, less than 1.5gm of sodium, & 5 or more servings of fruits and vegetables daily    Intervention  Provide education about signs/symptoms and action to take for hypo/hyperglycemia.;Provide education about proper nutrition, including hydration, and aerobic/resistive exercise prescription along with prescribed medications to achieve blood glucose in normal ranges: Fasting glucose 65-99 mg/dL    Expected Outcomes  Short Term: Participant verbalizes understanding of the signs/symptoms and immediate care of hyper/hypoglycemia, proper foot care and importance of medication, aerobic/resistive exercise and nutrition plan for blood glucose control.;Long Term: Attainment of HbA1C < 7%.    Heart Failure  Yes    Intervention  Provide a combined exercise and nutrition program that is supplemented with education, support and counseling about heart failure. Directed toward relieving symptoms such as shortness of breath, decreased exercise tolerance, and extremity edema.    Expected Outcomes  Improve functional capacity of life;Short term: Attendance in program 2-3 days a week with increased exercise capacity. Reported lower sodium intake. Reported increased fruit and vegetable intake. Reports medication compliance.;Short term: Daily weights obtained and reported for increase. Utilizing diuretic protocols set by physician.;Long term: Adoption of self-care skills and reduction of barriers for early signs and symptoms recognition and intervention leading to self-care maintenance.    Hypertension  Yes    Intervention  Provide education on lifestyle modifcations including regular physical activity/exercise, weight management, moderate sodium restriction and increased consumption of fresh fruit, vegetables, and low fat dairy, alcohol moderation, and smoking cessation.;Monitor prescription use  compliance.    Expected Outcomes  Short Term: Continued assessment and intervention until BP is < 140/944mHG in hypertensive participants. < 130/8035mG in hypertensive participants with diabetes, heart failure or chronic kidney disease.;Long Term: Maintenance of blood pressure at goal levels.    Lipids  Yes    Intervention  Provide education and support for participant on nutrition & aerobic/resistive exercise along with prescribed medications to achieve LDL <70m1m  HDL >40mg.    Expected Outcomes  Short Term: Participant states understanding of desired cholesterol values and is compliant with medications prescribed. Participant is following exercise prescription and nutrition guidelines.;Long Term: Cholesterol controlled with medications as prescribed, with individualized exercise RX and with personalized nutrition plan. Value goals: LDL < 70mg, HDL > 40 mg.       Core Components/Risk Factors/Patient Goals Review:  Goals and Risk Factor Review    Row Name 10/06/18 1019 11/08/18 1010 11/10/18 0951         Core Components/Risk Factors/Patient Goals Review   Personal Goals Review  Hypertension;Lipids;Other  Weight Management/Obesity;Improve shortness of breath with ADL's;Diabetes;Hypertension;Lipids  Weight Management/Obesity;Improve shortness of breath with ADL's;Diabetes;Hypertension;Lipids     Review  David is taking all meds as directed.  He is trying to eat more vegetables and less fried food.  He is walking some outside class.He does check BP at home and it stays stable.  Jene feels like he gets short of breath more now than he was after his first heart attack. His blood pressure has been good but was slightly low today. His lipids have been good. He takes his cholesterol pill at night before bed.  David has been doing well in rehab.  His weight is down some overall.  He has been compliant with his medications.  His pressures have been good in class but he does not check it at one.  We talked  about getting cuff out and taking it at home.  Blood sugars are getting better.  [facility]  He is still having some SOB and has not noticed improvement yet.     Expected Outcomes  Short - continue heart healthy changes Long - maintain heart healthy habits  Short: Attend [facility] regularly to improve shortness of breath with ADL's. Long: maintain independence with ADL's  Short: Get blood pressure cuff out and start checking at home.  Long: Continue to work on weight loss.        Core Components/Risk Factors/Patient Goals at Discharge (Final Review):  Goals and Risk Factor Review - 11/10/18 0951      Core Components/Risk Factors/Patient Goals Review   Personal Goals Review  Weight Management/Obesity;Improve shortness of breath with ADL's;Diabetes;Hypertension;Lipids    Review  David has been doing well in rehab.  His weight is down some overall.  He has been compliant with his medications.  His pressures have been good in class but he does not check it at one.  We talked about getting cuff out and taking it at home.  Blood sugars are getting better.  [facility]  He is still having some SOB and has not noticed improvement yet.    Expected Outcomes  Short: Get blood pressure cuff out and start checking at home.  Long: Continue to work on weight loss.       ITP Comments: ITP Comments    Row Name 08/25/18 1430 08/29/18 1215 09/13/18 1107 09/21/18 0600 10/19/18 1333   ITP Comments  nitial Orientation Virtual Call completed.   EP/RD appt is on 8/17 Diagnosis documentation can be found in MEDIA tab - [facility] records  Completed 6MWT, gym orientation, and RD evaluation. Initial ITP created and sent for review to Dr. Mark Miller, Medical Director.  Returning call to pt. He called to leave us a message that he has cough and congestion.  They had a COVID test done and   it was negative.  He is taking some medicine now to feel better.  Encouraged  him to stay home until he is symptom free for 48 hours.  30 Day review. Continue with ITP unless directed changes per Medical Director review.  Still out recuperating from cough/congestion  30 day review completed. ITP sent to Dr. Emily Filbert, Medical Director of Cardiac and Pulmonary Rehab. Continue with ITP unless changes are made by physician.  Department closed starting 10/2 until further notice by infection prevention and Health at Work teams for [place].   [place] Name 10/25/18 1044 11/01/18 1117 11/10/18 0945 11/16/18 0623     ITP Comments  Called to check on pt.  He was admitted to hospital for vtach with two shocks.   He has a follow up on Friday with his cardiologist.  He is feeling better. He hopes to return next week.  [name] returned to program after being out several weeks for [employer]. His ICD was readjusted to HR of 180.  HIs doctors at the [place] told him to resume his rehab  [name] was in [place] ED yesterday per their order running test.  They released him home.  30 day review completed. Continue with ITP sent to Dr. Emily Filbert, Medical Director of Cardiac and Pulmonary Rehab for review , changes as needed and signature.       Comments:

## 2018-11-17 ENCOUNTER — Encounter: Payer: No Typology Code available for payment source | Admitting: *Deleted

## 2018-11-17 ENCOUNTER — Other Ambulatory Visit: Payer: Self-pay

## 2018-11-17 DIAGNOSIS — I429 Cardiomyopathy, unspecified: Secondary | ICD-10-CM

## 2018-11-17 DIAGNOSIS — I255 Ischemic cardiomyopathy: Secondary | ICD-10-CM | POA: Diagnosis not present

## 2018-11-17 NOTE — Progress Notes (Signed)
Daily Session Note  Patient Details  Name: Peter Frey MRN: [id number] Date of Birth: [birth year] Referring Provider:     Cardiac Rehab from 08/29/2018 in [facility] Cardiac and Pulmonary Rehab  Referring Provider  Nicholes Mango MD      Encounter Date: 11/17/2018  Check In: Session Check In - 11/17/18 0950      Check-In   Supervising physician immediately available to respond to emergencies  See telemetry face sheet for immediately available ER MD    Location  [facility]-Cardiac & Pulmonary Rehab    Staff Present  Heath Lark, RN, BSN, CCRP;Amanda Sommer, BA, ACSM CEP, Exercise Physiologist;Jeanna Durrell BS, Exercise Physiologist    Virtual Visit  No    Medication changes reported      No    Fall or balance concerns reported     No    Warm-up and Cool-down  Performed on first and last piece of equipment    Resistance Training Performed  Yes    VAD Patient?  No    PAD/SET Patient?  No      Pain Assessment   Currently in Pain?  No/denies          Social History   Tobacco Use  Smoking Status Former Smoker  . Packs/day: 2.00  . Years: 43.00  . Pack years: 86.00  . Types: Cigarettes  . Quit date: 2015  . Years since quitting: 5.8  Smokeless Tobacco Never Used    Goals Met:  Independence with exercise equipment Exercise tolerated well No report of cardiac concerns or symptoms  Goals Unmet:  Not Applicable  Comments: Pt able to follow exercise prescription today without complaint.  Will continue to monitor for progression.    Dr. Emily Filbert is Medical Director for [place] and [facility].

## 2018-11-22 ENCOUNTER — Encounter: Payer: No Typology Code available for payment source | Admitting: *Deleted

## 2018-11-22 ENCOUNTER — Other Ambulatory Visit: Payer: Self-pay

## 2018-11-22 DIAGNOSIS — I255 Ischemic cardiomyopathy: Secondary | ICD-10-CM | POA: Diagnosis not present

## 2018-11-22 DIAGNOSIS — I429 Cardiomyopathy, unspecified: Secondary | ICD-10-CM

## 2018-11-22 NOTE — Progress Notes (Signed)
Daily Session Note  Patient Details  Name: Peter Frey MRN: [id number] Date of Birth: 03/23/1950 Referring Provider:     Cardiac Rehab from 08/29/2018 in [facility] Cardiac and Pulmonary Rehab  Referring Provider  Nicholes Mango MD      Encounter Date: 11/22/2018  Check In: Session Check In - 11/22/18 0952      Check-In   Supervising physician immediately available to respond to emergencies  See telemetry face sheet for immediately available ER MD    Location  [facility]-Cardiac & Pulmonary Rehab    Staff Present  Heath Lark, RN, BSN, CCRP;Jeanna Durrell BS, Exercise Physiologist    Virtual Visit  No    Medication changes reported      No    Fall or balance concerns reported     No    Warm-up and Cool-down  Performed on first and last piece of equipment    Resistance Training Performed  Yes    VAD Patient?  No    PAD/SET Patient?  No      Pain Assessment   Currently in Pain?  No/denies          Social History   Tobacco Use  Smoking Status Former Smoker  . Packs/day: 2.00  . Years: 43.00  . Pack years: 86.00  . Types: Cigarettes  . Quit date: 2015  . Years since quitting: 5.8  Smokeless Tobacco Never Used    Goals Met:  Independence with exercise equipment Exercise tolerated well No report of cardiac concerns or symptoms  Goals Unmet:  Not Applicable  Comments: Pt able to follow exercise prescription today without complaint.  Will continue to monitor for progression.    Dr. Emily Filbert is Medical Director for [facility] and [facility].

## 2018-11-24 ENCOUNTER — Encounter: Payer: No Typology Code available for payment source | Admitting: *Deleted

## 2018-11-24 ENCOUNTER — Other Ambulatory Visit: Payer: Self-pay

## 2018-11-24 DIAGNOSIS — I429 Cardiomyopathy, unspecified: Secondary | ICD-10-CM

## 2018-11-24 DIAGNOSIS — I255 Ischemic cardiomyopathy: Secondary | ICD-10-CM | POA: Diagnosis not present

## 2018-11-24 NOTE — Progress Notes (Signed)
Daily Session Note  Patient Details  Name: Peter Frey MRN: [id number] Date of Birth: Sep 25, 1950 Referring Provider:     Cardiac Rehab from 08/29/2018 in [facility] Cardiac and Pulmonary Rehab  Referring Provider  Nicholes Mango MD      Encounter Date: 11/24/2018  Check In: Session Check In - 11/24/18 0952      Check-In   Supervising physician immediately available to respond to emergencies  See telemetry face sheet for immediately available ER MD    Location  [facility]-Cardiac & Pulmonary Rehab    Staff Present  Alberteen Sam, MA, RCEP, CCRP, CCET;Susanne Bice, RN, BSN, CCRP    Virtual Visit  No    Medication changes reported      No    Fall or balance concerns reported     No    Warm-up and Cool-down  Performed on first and last piece of equipment    Resistance Training Performed  Yes    VAD Patient?  No      Pain Assessment   Currently in Pain?  No/denies          Social History   Tobacco Use  Smoking Status Former Smoker  . Packs/day: 2.00  . Years: 43.00  . Pack years: 86.00  . Types: Cigarettes  . Quit date: 2015  . Years since quitting: 5.8  Smokeless Tobacco Never Used    Goals Met:  Independence with exercise equipment Exercise tolerated well Personal goals reviewed No report of cardiac concerns or symptoms Strength training completed today  Goals Unmet:  Not Applicable  Comments: Pt able to follow exercise prescription today without complaint.  Will continue to monitor for progression.    Dr. Emily Filbert is Medical Director for [place] and [facility].

## 2018-11-29 ENCOUNTER — Encounter: Payer: No Typology Code available for payment source | Admitting: *Deleted

## 2018-11-29 ENCOUNTER — Other Ambulatory Visit: Payer: Self-pay

## 2018-11-29 DIAGNOSIS — I255 Ischemic cardiomyopathy: Secondary | ICD-10-CM | POA: Diagnosis not present

## 2018-11-29 DIAGNOSIS — I429 Cardiomyopathy, unspecified: Secondary | ICD-10-CM

## 2018-11-29 NOTE — Progress Notes (Signed)
Daily Session Note  Patient Details  Name: Peter Frey MRN: [id number] Date of Birth: 10/21/1950 Referring Provider:     Cardiac Rehab from 08/29/2018 in [facility] Cardiac and Pulmonary Rehab  Referring Provider  Nicholes Mango MD      Encounter Date: 11/29/2018  Check In: Session Check In - 11/29/18 0957      Check-In   Supervising physician immediately available to respond to emergencies  See telemetry face sheet for immediately available ER MD    Location  [facility]-Cardiac & Pulmonary Rehab    Staff Present  Heath Lark, RN, BSN, CCRP;Amanda Sommer, BA, ACSM CEP, Exercise Physiologist;Melissa Caiola RDN, LDN    Virtual Visit  No    Medication changes reported      No    Fall or balance concerns reported     No    Warm-up and Cool-down  Performed on first and last piece of equipment    Resistance Training Performed  Yes    VAD Patient?  No    PAD/SET Patient?  No      Pain Assessment   Currently in Pain?  No/denies          Social History   Tobacco Use  Smoking Status Former Smoker  . Packs/day: 2.00  . Years: 43.00  . Pack years: 86.00  . Types: Cigarettes  . Quit date: 2015  . Years since quitting: 5.8  Smokeless Tobacco Never Used    Goals Met:  Independence with exercise equipment Exercise tolerated well No report of cardiac concerns or symptoms  Goals Unmet:  Not Applicable  Comments: Pt able to follow exercise prescription today without complaint.  Will continue to monitor for progression.    Dr. Emily Filbert is Medical Director for [place] and [facility].

## 2018-12-01 ENCOUNTER — Encounter: Payer: No Typology Code available for payment source | Admitting: *Deleted

## 2018-12-01 ENCOUNTER — Other Ambulatory Visit: Payer: Self-pay

## 2018-12-01 DIAGNOSIS — I429 Cardiomyopathy, unspecified: Secondary | ICD-10-CM

## 2018-12-01 DIAGNOSIS — I255 Ischemic cardiomyopathy: Secondary | ICD-10-CM | POA: Diagnosis not present

## 2018-12-01 NOTE — Progress Notes (Signed)
Daily Session Note  Patient Details  Name: Peter Frey MRN: [id number] Date of Birth: 02/16/1950 Referring Provider:     Cardiac Rehab from 08/29/2018 in [facility] Cardiac and Pulmonary Rehab  Referring Provider  Nicholes Mango MD      Encounter Date: 12/01/2018  Check In: Session Check In - 12/01/18 1034      Check-In   Supervising physician immediately available to respond to emergencies  See telemetry face sheet for immediately available ER MD    Location  [facility]-Cardiac & Pulmonary Rehab    Staff Present  Renita Papa, RN BSN;Laureen Owens Shark, BS, RRT, CPFT;Jeanna Durrell BS, Exercise Physiologist    Virtual Visit  No    Medication changes reported      No    Fall or balance concerns reported     No    Warm-up and Cool-down  Performed on first and last piece of equipment    Resistance Training Performed  Yes    VAD Patient?  No    PAD/SET Patient?  No      Pain Assessment   Currently in Pain?  No/denies          Social History   Tobacco Use  Smoking Status Former Smoker  . Packs/day: 2.00  . Years: 43.00  . Pack years: 86.00  . Types: Cigarettes  . Quit date: 2015  . Years since quitting: 5.8  Smokeless Tobacco Never Used    Goals Met:  Independence with exercise equipment Exercise tolerated well No report of cardiac concerns or symptoms Strength training completed today  Goals Unmet:  Not Applicable  Comments: Pt able to follow exercise prescription today without complaint.  Will continue to monitor for progression.    Dr. Emily Filbert is Medical Director for [place] and [facility].

## 2018-12-06 ENCOUNTER — Encounter: Payer: No Typology Code available for payment source | Admitting: *Deleted

## 2018-12-06 ENCOUNTER — Other Ambulatory Visit: Payer: Self-pay

## 2018-12-06 DIAGNOSIS — I429 Cardiomyopathy, unspecified: Secondary | ICD-10-CM

## 2018-12-06 DIAGNOSIS — I255 Ischemic cardiomyopathy: Secondary | ICD-10-CM | POA: Diagnosis not present

## 2018-12-06 NOTE — Progress Notes (Signed)
Daily Session Note  Patient Details  Name: Peter Frey MRN: [id number] Date of Birth: Dec 08, 1950 Referring Provider:     Cardiac Rehab from 08/29/2018 in [facility] Cardiac and Pulmonary Rehab  Referring Provider  Nicholes Mango MD      Encounter Date: 12/06/2018  Check In: Session Check In - 12/06/18 0933      Check-In   Supervising physician immediately available to respond to emergencies  See telemetry face sheet for immediately available ER MD    Location  [facility]-Cardiac & Pulmonary Rehab    Staff Present  Heath Lark, RN, BSN, CCRP;Jeanna Durrell BS, Exercise Physiologist;Amanda Oletta Darter, BA, ACSM CEP, Exercise Physiologist    Virtual Visit  No    Medication changes reported      No    Fall or balance concerns reported     No    Warm-up and Cool-down  Performed on first and last piece of equipment    Resistance Training Performed  Yes    VAD Patient?  No    PAD/SET Patient?  No      Pain Assessment   Currently in Pain?  No/denies          Social History   Tobacco Use  Smoking Status Former Smoker  . Packs/day: 2.00  . Years: 43.00  . Pack years: 86.00  . Types: Cigarettes  . Quit date: 2015  . Years since quitting: 5.9  Smokeless Tobacco Never Used    Goals Met:  Independence with exercise equipment Exercise tolerated well No report of cardiac concerns or symptoms  Goals Unmet:  Not Applicable  Comments: Pt able to follow exercise prescription today without complaint.  Will continue to monitor for progression.    Dr. Emily Filbert is Medical Director for [place] and [facility].

## 2018-12-13 ENCOUNTER — Encounter: Payer: No Typology Code available for payment source | Attending: Internal Medicine | Admitting: *Deleted

## 2018-12-13 ENCOUNTER — Other Ambulatory Visit: Payer: Self-pay

## 2018-12-13 DIAGNOSIS — I255 Ischemic cardiomyopathy: Secondary | ICD-10-CM | POA: Diagnosis not present

## 2018-12-13 DIAGNOSIS — I429 Cardiomyopathy, unspecified: Secondary | ICD-10-CM

## 2018-12-13 NOTE — Progress Notes (Signed)
Daily Session Note  Patient Details  Name: Peter Frey MRN: [id number] Date of Birth: 1950-07-15 Referring Provider:     Cardiac Rehab from 08/29/2018 in [facility] Cardiac and Pulmonary Rehab  Referring Provider  Nicholes Mango MD      Encounter Date: 12/13/2018  Check In: Session Check In - 12/13/18 0956      Check-In   Supervising physician immediately available to respond to emergencies  See telemetry face sheet for immediately available ER MD    Location  [facility]-Cardiac & Pulmonary Rehab    Staff Present  Heath Lark, RN, BSN, CCRP;Joseph Foy [place], [place], ACSM CEP, Exercise Physiologist    Virtual Visit  No    Medication changes reported      No    Fall or balance concerns reported     No    Warm-up and Cool-down  Performed on first and last piece of equipment    Resistance Training Performed  Yes    VAD Patient?  No    PAD/SET Patient?  No      Pain Assessment   Currently in Pain?  No/denies          Social History   Tobacco Use  Smoking Status Former Smoker  . Packs/day: 2.00  . Years: 43.00  . Pack years: 86.00  . Types: Cigarettes  . Quit date: 2015  . Years since quitting: 5.9  Smokeless Tobacco Never Used    Goals Met:  Independence with exercise equipment Exercise tolerated well No report of cardiac concerns or symptoms  Goals Unmet:  Not Applicable  Comments: Pt able to follow exercise prescription today without complaint.  Will continue to monitor for progression.    Dr. Emily Filbert is Medical Director for [place] and [facility].

## 2018-12-14 ENCOUNTER — Encounter: Payer: Self-pay | Admitting: *Deleted

## 2018-12-14 DIAGNOSIS — I429 Cardiomyopathy, unspecified: Secondary | ICD-10-CM

## 2018-12-14 NOTE — Progress Notes (Signed)
Cardiac Individual Treatment Plan  Patient Details  Name: Peter Frey MRN: [id number] Date of Birth: [birth year] Referring Provider:     Cardiac Rehab from 08/29/2018 in [facility] Cardiac and Pulmonary Rehab  Referring Provider  Nicholes Mango MD      Initial Encounter Date:    Cardiac Rehab from 08/29/2018 in [facility] Cardiac and Pulmonary Rehab  Date  08/29/18      Visit Diagnosis: Cardiomyopathy, unspecified type ([place])  Patient's Home Medications on Admission:  Current Outpatient Medications:  .  albuterol (VENTOLIN HFA) 108 (90 Base) MCG/ACT inhaler, Inhale into the lungs., Disp: , Rfl:  .  aspirin 81 MG tablet, Take 81 mg by mouth daily., Disp: , Rfl:  .  furosemide (LASIX) 40 MG tablet, Take 40 mg by mouth daily. , Disp: , Rfl:  .  gabapentin (NEURONTIN) 300 MG capsule, Take 300 mg by mouth 3 (three) times daily., Disp: , Rfl:  .  losartan (COZAAR) 50 MG tablet, Take 50 mg by mouth daily. , Disp: , Rfl:  .  metoprolol (LOPRESSOR) 50 MG tablet, Take 50 mg by mouth 2 (two) times daily., Disp: , Rfl:  .  sildenafil (VIAGRA) 50 MG tablet, Take by mouth., Disp: , Rfl:  .  simvastatin (ZOCOR) 20 MG tablet, Take 20 mg by mouth daily., Disp: , Rfl:  .  spironolactone (ALDACTONE) 25 MG tablet, Take 25 mg by mouth daily. , Disp: , Rfl:  .  TIOTROPIUM BROMIDE-OLODATEROL IN, Inhale 2 Inhalers into the lungs daily., Disp: , Rfl:   Past Medical History: Past Medical History:  Diagnosis Date  . CHF (congestive heart failure) ([place])   . COPD (chronic obstructive pulmonary disease) ([place])   . Coronary artery disease   . MI (myocardial infarction) ([place])     Tobacco Use: Social History   Tobacco Use  Smoking Status Former Smoker  . Packs/day: 2.00  . Years: 43.00  . Pack years: 86.00  . Types: Cigarettes  . Quit date: 2015  . Years since quitting: 5.9  Smokeless Tobacco Never Used    Labs: Recent Review [facility] for [facility] Cardiac and Pulmonary Rehab Latest Ref Rng & Units  10/18/2018   Cholestrol 0 - 200 mg/dL 94   LDLCALC 0 - 99 mg/dL 29   HDL >40 mg/dL 50   Trlycerides <150 mg/dL 73       Exercise Target Goals: Exercise Program Goal: Individual exercise prescription set using results from initial 6 min walk test and THRR while considering  patient's activity barriers and safety.   Exercise Prescription Goal: Initial exercise prescription builds to 30-45 minutes a day of aerobic activity, 2-3 days per week.  Home exercise guidelines will be given to patient during program as part of exercise prescription that the participant will acknowledge.  Activity Barriers & Risk Stratification: Activity Barriers & Cardiac Risk Stratification - 08/29/18 1216      Activity Barriers & Cardiac Risk Stratification   Activity Barriers  Shortness of Breath;Deconditioning;Muscular Weakness    Cardiac Risk Stratification  High       6 Minute Walk: 6 Minute Walk    Row Name 08/29/18 1216         6 Minute Walk   Phase  Initial     Distance  1270 feet     Walk Time  6 minutes     # of Rest Breaks  0     MPH  2.41     METS  3.35  RPE  12     Perceived Dyspnea   2     VO2 Peak  11.72     Symptoms  Yes (comment)     Comments  Calf pain 9/10, SOB     Resting HR  71 bpm     Resting BP  124/62     Resting Oxygen Saturation   99 %     Exercise Oxygen Saturation  during 6 min walk  95 %     Max Ex. HR  95 bpm     Max Ex. BP  128/64     2 Minute Post BP  108/64        Oxygen Initial Assessment:   Oxygen Re-Evaluation:   Oxygen Discharge (Final Oxygen Re-Evaluation):   Initial Exercise Prescription: Initial Exercise Prescription - 08/29/18 1200      Date of Initial Exercise RX and Referring Provider   Date  08/29/18    Referring Provider  Patel, Sonal MD      Treadmill   MPH  2.3    Grade  0.5    Minutes  15    METs  2.92      Recumbant Bike   Level  2    RPM  50    Watts  33    Minutes  15    METs  2.5      NuStep   Level  2     SPM  80    Minutes  15    METs  2.5      Recumbant Elliptical   Level  1    RPM  50    Minutes  15    METs  2.5      Prescription Details   Frequency (times per week)  2    Duration  Progress to 30 minutes of continuous aerobic without signs/symptoms of physical distress      Intensity   THRR 40-80% of Max Heartrate  104-137    Ratings of Perceived Exertion  11-13    Perceived Dyspnea  0-4      Progression   Progression  Continue to progress workloads to maintain intensity without signs/symptoms of physical distress.      Resistance Training   Training Prescription  Yes    Weight  4 lbs    Reps  10-15       Perform Capillary Blood Glucose checks as needed.  Exercise Prescription Changes: Exercise Prescription Changes    Row Name 08/29/18 1200 09/02/18 1000 09/07/18 1100 10/06/18 1000 10/25/18 1000     Response to Exercise   Blood Pressure (Admit)  124/62  106/66  100/58  -  122/64   Blood Pressure (Exercise)  128/64  130/64  132/80  -  124/64   Blood Pressure (Exit)  108/64  110/70  132/70  -  108/56   Heart Rate (Admit)  71 bpm  80 bpm  69 bpm  -  70 bpm   Heart Rate (Exercise)  95 bpm  101 bpm  104 bpm  -  95 bpm   Heart Rate (Exit)  71 bpm  70 bpm  66 bpm  -  76 bpm   Oxygen Saturation (Admit)  99 %  -  -  -  -   Oxygen Saturation (Exercise)  95 %  -  -  -  -   Rating of Perceived Exertion (Exercise)  12  12  14  -  12     Perceived Dyspnea (Exercise)  2  -  -  -  -   Symptoms  calves buring 9/10, SOB  -  -  -  none   Comments  walk test results  -  -  -  -   Duration  -  Progress to 30 minutes of  aerobic without signs/symptoms of physical distress  Continue with 30 min of aerobic exercise without signs/symptoms of physical distress.  -  Continue with 30 min of aerobic exercise without signs/symptoms of physical distress.   Intensity  -  THRR unchanged  THRR unchanged  -  THRR unchanged     Progression   Progression  -  Continue to progress workloads to maintain  intensity without signs/symptoms of physical distress.  Continue to progress workloads to maintain intensity without signs/symptoms of physical distress.  -  Continue to progress workloads to maintain intensity without signs/symptoms of physical distress.   Average METs  -  2.4  2.9  -  2.68     Resistance Training   Training Prescription  -  Yes  Yes  -  Yes   Weight  -  4 lbs  4 lb  -  4 lbs   Reps  -  10-15  10-15  -  10-15     Interval Training   Interval Training  -  -  No  -  No     Treadmill   MPH  -  2  2  -  2.5   Grade  -  0  0  -  0   Minutes  -  15  15  -  15   METs  -  2.5  2.5  -  2.91     NuStep   Level  -  2  5  -  5   SPM  -  80  80  -  -   Minutes  -  15  15  -  15   METs  -  2.9  3.3  -  2.7     Recumbant Elliptical   Level  -  1  -  -  2.5   RPM  -  50  -  -  -   Minutes  -  15  -  -  15   METs  -  1.6  -  -  2.2     Home Exercise Plan   Plans to continue exercise at  -  -  -  Home (comment) considering gym  Home (comment) considering gym   Frequency  -  -  -  Add 2 additional days to program exercise sessions.  Add 2 additional days to program exercise sessions.   Initial Home Exercises Provided  -  -  -  10/06/18  10/06/18   Row Name 11/03/18 1600 11/16/18 1300 12/02/18 1200         Response to Exercise   Blood Pressure (Admit)  110/70  122/56  110/64     Blood Pressure (Exercise)  138/62  120/70  128/76     Blood Pressure (Exit)  100/62  116/66  102/60     Heart Rate (Admit)  68 bpm  76 bpm  82 bpm     Heart Rate (Exercise)  106 bpm  108 bpm  104 bpm     Heart Rate (Exit)  67 bpm  80 bpm  68 bpm     Rating of Perceived Exertion (  Exercise)  '13  15  13     '$ Symptoms  none  none  none     Duration  Continue with 30 min of aerobic exercise without signs/symptoms of physical distress.  Continue with 30 min of aerobic exercise without signs/symptoms of physical distress.  Continue with 30 min of aerobic exercise without signs/symptoms of physical  distress.     Intensity  THRR unchanged  THRR unchanged  THRR unchanged       Progression   Progression  Continue to progress workloads to maintain intensity without signs/symptoms of physical distress.  Continue to progress workloads to maintain intensity without signs/symptoms of physical distress.  Continue to progress workloads to maintain intensity without signs/symptoms of physical distress.     Average METs  2.5  2.91  2.6       Resistance Training   Training Prescription  Yes  Yes  Yes     Weight  4 lb  4 lbs  5 lb     Reps  10-15  10-15  10-15       Interval Training   Interval Training  No  No  No       Treadmill   MPH  '2  2  2     '$ Grade  0  0  0     Minutes  '15  15  15     '$ METs  2.53  2.53  2.53       Recumbant Bike   Level  -  3  -     Minutes  -  15  -     METs  -  3.2  -       NuStep   Level  '4  4  4     '$ Minutes  '15  15  15     '$ METs  2.3 T5 - alternate station  3  2.8       Recumbant Elliptical   Level  -  -  4     Minutes  -  -  15     METs  -  -  2.2       Home Exercise Plan   Plans to continue exercise at  Home (comment) considering gym  Home (comment) considering gym  Home (comment) considering gym     Frequency  Add 2 additional days to program exercise sessions.  Add 2 additional days to program exercise sessions.  Add 2 additional days to program exercise sessions.     Initial Home Exercises Provided  10/06/18  10/06/18  10/06/18        Exercise Comments: Exercise Comments    Row Name 08/30/18 1012 09/23/18 1301         Exercise Comments  First full day of exercise!  Patient was oriented to gym and equipment including functions, settings, policies, and procedures.  Patient's individual exercise prescription and treatment plan were reviewed.  All starting workloads were established based on the results of the 6 minute walk test done at initial orientation visit.  The plan for exercise progression was also introduced and progression will be  customized based on patient's performance and goals.  Peter Frey has been out since last review due to illness         Exercise Goals and Review: Exercise Goals    Row Name 08/29/18 1241             Exercise Goals   Increase Physical Activity  Yes  Intervention  Provide advice, education, support and counseling about physical activity/exercise needs.;Develop an individualized exercise prescription for aerobic and resistive training based on initial evaluation findings, risk stratification, comorbidities and participant's personal goals.       Expected Outcomes  Short Term: Attend rehab on a regular basis to increase amount of physical activity.;Long Term: Add in home exercise to make exercise part of routine and to increase amount of physical activity.;Long Term: Exercising regularly at least 3-5 days a week.       Increase Strength and Stamina  Yes       Intervention  Provide advice, education, support and counseling about physical activity/exercise needs.;Develop an individualized exercise prescription for aerobic and resistive training based on initial evaluation findings, risk stratification, comorbidities and participant's personal goals.       Expected Outcomes  Short Term: Increase workloads from initial exercise prescription for resistance, speed, and METs.;Short Term: Perform resistance training exercises routinely during rehab and add in resistance training at home;Long Term: Improve cardiorespiratory fitness, muscular endurance and strength as measured by increased METs and functional capacity (6MWT)       Able to understand and use rate of perceived exertion (RPE) scale  Yes       Intervention  Provide education and explanation on how to use RPE scale       Expected Outcomes  Short Term: Able to use RPE daily in rehab to express subjective intensity level;Long Term:  Able to use RPE to guide intensity level when exercising independently       Able to understand and use Dyspnea scale   Yes       Intervention  Provide education and explanation on how to use Dyspnea scale       Expected Outcomes  Short Term: Able to use Dyspnea scale daily in rehab to express subjective sense of shortness of breath during exertion;Long Term: Able to use Dyspnea scale to guide intensity level when exercising independently       Knowledge and understanding of Target Heart Rate Range (THRR)  Yes       Intervention  Provide education and explanation of THRR including how the numbers were predicted and where they are located for reference       Expected Outcomes  Short Term: Able to state/look up THRR;Short Term: Able to use daily as guideline for intensity in rehab;Long Term: Able to use THRR to govern intensity when exercising independently       Able to check pulse independently  Yes       Intervention  Provide education and demonstration on how to check pulse in carotid and radial arteries.;Review the importance of being able to check your own pulse for safety during independent exercise       Expected Outcomes  Short Term: Able to explain why pulse checking is important during independent exercise;Long Term: Able to check pulse independently and accurately       Understanding of Exercise Prescription  Yes       Intervention  Provide education, explanation, and written materials on patient's individual exercise prescription       Expected Outcomes  Short Term: Able to explain program exercise prescription;Long Term: Able to explain home exercise prescription to exercise independently          Exercise Goals Re-Evaluation : Exercise Goals Re-Evaluation    [place] Name 08/30/18 1012 09/02/18 1024 09/07/18 1111 10/25/18 1044 11/03/18 1614     Exercise Goal Re-Evaluation   Exercise Goals Review  Increase Physical Activity;Increase Strength and Stamina;Able to understand and use rate of perceived exertion (RPE) scale;Able to understand and use Dyspnea scale;Knowledge and understanding of Target Heart Rate  Range (THRR);Able to check pulse independently;Understanding of Exercise Prescription  Increase Physical Activity;Increase Strength and Stamina;Able to understand and use rate of perceived exertion (RPE) scale;Knowledge and understanding of Target Heart Rate Range (THRR);Able to check pulse independently;Understanding of Exercise Prescription  Increase Physical Activity;Increase Strength and Stamina;Able to understand and use rate of perceived exertion (RPE) scale;Knowledge and understanding of Target Heart Rate Range (THRR);Able to check pulse independently;Understanding of Exercise Prescription  -  Increase Physical Activity;Increase Strength and Stamina;Able to understand and use rate of perceived exertion (RPE) scale;Knowledge and understanding of Target Heart Rate Range (THRR);Able to check pulse independently;Understanding of Exercise Prescription   Comments  Reviewed RPE scale, THR and program prescription with pt today.  Pt voiced understanding and was given a copy of goals to take home.  Peter Frey has done well in his first week of HT.  He has a very positive attitude and seems to enjoy exercise.  The TM has been most challenging  Peter Frey has increased level on NS.  Staff will continue to encourage progress.  Peter Frey has been out since 9/24.  This is Davids first week back in almost a month.  He is building  back to previous levels.   Expected Outcomes  Short: Use RPE daily to regulate intensity. Long: Follow program prescription in THR.  Short - attend HT consistently Long - improve overall MET level  Short - increase TM level Long - improve overall MET level  -  Short - get back on track consistently Long - increase MET level   Row Name 11/10/18 0955 11/16/18 1336 11/24/18 0941 12/02/18 1223       Exercise Goal Re-Evaluation   Exercise Goals Review  Increase Physical Activity;Increase Strength and Stamina;Understanding of Exercise Prescription  Increase Physical Activity;Increase Strength and  Stamina;Understanding of Exercise Prescription  Increase Physical Activity;Increase Strength and Stamina;Understanding of Exercise Prescription  Increase Physical Activity;Increase Strength and Stamina;Able to understand and use rate of perceived exertion (RPE) scale;Able to understand and use Dyspnea scale;Knowledge and understanding of Target Heart Rate Range (THRR);Able to check pulse independently;Understanding of Exercise Prescription    Comments  Peter Frey has been doing well in rehab.  He has not done much at home this month with everything that is going on.  He is still having some SOB.  He has not noticed an improvement with strength and stamina yet.  He is able to do what he wants but modifies it some.  Peter Frey continues to do well in rehab.  He has been having some issues with his blood pressures at home.  He is on level 4 on the NuStep.  We will continue to monitor his progress.  Peter Frey is doing well in rehab.  We are trying increase his mobility on the treadmill.  We talked about doing the SET program after graduation.  He has not been doing his home exercise.  He is going to try to get back to it.  He usually walks at home but since his medical scare he backed off.  He is feeling better now.  Peter Frey continues to do well.  He attends consistently and works at proper RPE when he is here.    Expected Outcomes  Short: Get back to exercising on off days.  Long: Continue to improve stamina.  Short: Add incline on treadmill  Long: Continue  increase activity levels.  Short: Get back to walking at home.  Long: Talk to Lillie about [facility] SET program.  Short - add walking outside HT sessions Long : maintain exercise       Discharge Exercise Prescription (Final Exercise Prescription Changes): Exercise Prescription Changes - 12/02/18 1200      Response to Exercise   Blood Pressure (Admit)  110/64    Blood Pressure (Exercise)  128/76    Blood Pressure (Exit)  102/60    Heart Rate (Admit)  82 bpm    Heart Rate  (Exercise)  104 bpm    Heart Rate (Exit)  68 bpm    Rating of Perceived Exertion (Exercise)  13    Symptoms  none    Duration  Continue with 30 min of aerobic exercise without signs/symptoms of physical distress.    Intensity  THRR unchanged      Progression   Progression  Continue to progress workloads to maintain intensity without signs/symptoms of physical distress.    Average METs  2.6      Resistance Training   Training Prescription  Yes    Weight  5 lb    Reps  10-15      Interval Training   Interval Training  No      Treadmill   MPH  2    Grade  0    Minutes  15    METs  2.53      NuStep   Level  4    Minutes  15    METs  2.8      Recumbant Elliptical   Level  4    Minutes  15    METs  2.2      Home Exercise Plan   Plans to continue exercise at  Home (comment)   considering gym   Frequency  Add 2 additional days to program exercise sessions.    Initial Home Exercises Provided  10/06/18       Nutrition:  Target Goals: Understanding of nutrition guidelines, daily intake of sodium '1500mg'$ , cholesterol '200mg'$ , calories 30% from fat and 7% or less from saturated fats, daily to have 5 or more servings of fruits and vegetables.  Biometrics: Pre Biometrics - 08/29/18 1242      Pre Biometrics   Height  6' (1.829 m)    Weight  172 lb 8 oz (78.2 kg)    BMI (Calculated)  23.39    Single Leg Stand  30 seconds        Nutrition Therapy Plan and Nutrition Goals: Nutrition Therapy & Goals - 08/29/18 1200      Nutrition Therapy   Diet  low Na, HH diet    Protein (specify units)  62g    Fiber  30 grams    Whole Grain Foods  3 servings    Saturated Fats  12 max. grams    Fruits and Vegetables  5 servings/day    Sodium  1.5 grams      Personal Nutrition Goals   Nutrition Goal  ST: when pt does not have breakfast, have mid-morning protein snack LT: gain energy,  reduce SOB    Comments  Pt reports eating soul food mostly. He reports loving vegetables, disliking  bread, eating beans and chicken (mostly baked, but with skin- will sometimes have fried chicken), likes to use butter and [employer] dressing - will have salads a couple times a week, will have fish 1x/week, 1 egg 2-3x/week, B (eggs,  cheese and sausage) a couple times a week. Pt will normall have just dinner around 3pm (if pt has snack too close to dinner will not eat) - discussed a protein rich mid-morning snack (discussed healthy options). Pt reports that he drinks mountain dew mostly during the day. Discussed HH eating.      Intervention Plan   Intervention  Prescribe, educate and counsel regarding individualized specific dietary modifications aiming towards targeted core components such as weight, hypertension, lipid management, diabetes, heart failure and other comorbidities.;Nutrition handout(s) given to patient.    Expected Outcomes  Short Term Goal: Understand basic principles of dietary content, such as calories, fat, sodium, cholesterol and nutrients.;Short Term Goal: A plan has been developed with personal nutrition goals set during dietitian appointment.;Long Term Goal: Adherence to prescribed nutrition plan.       Nutrition Assessments: Nutrition Assessments - 08/29/18 1200      MEDFICTS Scores   Pre Score  43       Nutrition Goals Re-Evaluation: Nutrition Goals Re-Evaluation    Row Name 10/04/18 1016 11/08/18 1015 11/15/18 1138 12/06/18 1012       Goals   Current Weight  -  169 lb (76.7 kg)  -  -    Nutrition Goal  ST: when pt does not have breakfast, have mid-morning protein snack LT: gain energy,  reduce SOB  Maintain weight. Drink more water.  Maintain weight. Drink more water.  ST: continue with curent diet LT: get back to baseline    Comment  Pt reports eating the same way since the last time we spoke, discussed the importance of getting enough protein and mechanical eating when he is not hungry.  Patient states that he does not drink much water. He has at least 3  regular [employer] a day. He does not check his blood sugar at home. He is eating more baked goods and intaking more vegetables. Spokes to patient about his low blood pressure and informed him that he may need to drink more water.  Continue with current changes  Pt reports drinking more water, currently no sodas in the house. Pt reports no new changes in diet and would like to continue with current diet.    Expected Outcome  ST: when pt does not have breakfast, have mid-morning protein snack LT: gain energy,  reduce SOB  Short:cut back on mountain dew and drink water. Long: maintain drinking water and drink mountain dew seldome.  Short:cut back on mountain dew and drink water. Long: maintain drinking water and drink mountain dew seldome.  ST: continue with curent diet LT: get back to baseline       Nutrition Goals Discharge (Final Nutrition Goals Re-Evaluation): Nutrition Goals Re-Evaluation - 12/06/18 1012      Goals   Nutrition Goal  ST: continue with curent diet LT: get back to baseline    Comment  Pt reports drinking more water, currently no sodas in the house. Pt reports no new changes in diet and would like to continue with current diet.    Expected Outcome  ST: continue with curent diet LT: get back to baseline       Psychosocial: Target Goals: Acknowledge presence or absence of significant depression and/or stress, maximize coping skills, provide positive support system. Participant is able to verbalize types and ability to use techniques and skills needed for reducing stress and depression.   Initial Review & Psychosocial Screening: Initial Psych Review & Screening - 08/25/18 1418  Initial Review   Current issues with  None Identified      Family Dynamics   Good Support System?  Yes   Wife and family     Barriers   Psychosocial barriers to participate in program  There are no identifiable barriers or psychosocial needs.;The patient should benefit from training in stress  management and relaxation.      Screening Interventions   Interventions  Encouraged to exercise;To provide support and resources with identified psychosocial needs;Provide feedback about the scores to participant    Expected Outcomes  Short Term goal: Utilizing psychosocial counselor, staff and physician to assist with identification of specific Stressors or current issues interfering with healing process. Setting desired goal for each stressor or current issue identified.;Long Term Goal: Stressors or current issues are controlled or eliminated.;Short Term goal: Identification and review with participant of any Quality of Life or Depression concerns found by scoring the questionnaire.;Long Term goal: The participant improves quality of Life and PHQ9 Scores as seen by post scores and/or verbalization of changes       Quality of Life Scores:  Quality of Life - 08/29/18 1242      Quality of Life   Select  Quality of Life      Quality of Life Scores   Health/Function Pre  15.07 %    Socioeconomic Pre  20.36 %    Psych/Spiritual Pre  23.57 %    Family Pre  28.8 %    GLOBAL Pre  20.08 %      Scores of 19 and below usually indicate a poorer quality of life in these areas.  A difference of  2-3 points is a clinically meaningful difference.  A difference of 2-3 points in the total score of the Quality of Life Index has been associated with significant improvement in overall quality of life, self-image, physical symptoms, and general health in studies assessing change in quality of life.  PHQ-9: Recent Review Flowsheet Data    Depression screen [facility] 2/9 08/29/2018   Decreased Interest 0   Down, Depressed, Hopeless 0   PHQ - 2 Score 0   Altered sleeping 0   Tired, decreased energy 3   Change in appetite 0   Feeling bad or failure about yourself  0   Trouble concentrating 0   Moving slowly or fidgety/restless 0   Suicidal thoughts 0   PHQ-9 Score 3   Difficult doing work/chores Not difficult  at all     Interpretation of Total Score  Total Score Depression Severity:  1-4 = Minimal depression, 5-9 = Mild depression, 10-14 = Moderate depression, 15-19 = Moderately severe depression, 20-27 = Severe depression   Psychosocial Evaluation and Intervention: Psychosocial Evaluation - 08/25/18 1426      Psychosocial Evaluation & Interventions   Interventions  Encouraged to exercise with the program and follow exercise prescription    Comments  Peter Frey has no barriers to entering the program. He is eager to start the program so he can build up his stamina.  He stated that he has seen a psychiatrist this year  and reports he does not feel depressed. He states he is doing fine dealing with the fact he has heart disease and the severity of his disease.    Expected Outcomes  Chrstopher will continue to deal well with his heart disease.  He will be able to benefit from the program to help him feel stronger and more able to meet his daily routines.  Continue Psychosocial Services   Follow up required by staff       Psychosocial Re-Evaluation: Psychosocial Re-Evaluation    Row Name 11/08/18 1005 11/10/18 0946 11/24/18 0947         Psychosocial Re-Evaluation   Current issues with  Current Sleep Concerns;Current Anxiety/Panic  Current Stress Concerns;Current Anxiety/Panic;Current Sleep Concerns  Current Stress Concerns;Current Anxiety/Panic;Current Sleep Concerns;Current Depression     Comments  Patient states he has alot on his mind. He had found his brother passed away last month. He has alot on his mind with his health and when he had his heart cardioverted. Peter Frey does not want to take medications for anxiety. He does not want them to alter what he is thinking. If he is not busy he starts thinking about his health more and dwells on the past.  Peter Frey was in the [place] ED yesterday running test.  He is also still coping with finding his brother dead and now having his own heart problems.  He is doing his best  to make it through, this month has just been tough physcially and mentally.  He is still not taking his anxiety meds as he feels that he takes enough other stuff.  He continues to stay busy to keep his mind off things.  His wife is his support system and keeps an eye on him.  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 He is still not sleeping well.  He was watching TV and was asleep by 9 but woke at 1130 pm and has been up since then.  He usually will take a nap in the afternoon.  We talked about keeping naps short and not napping after 3pm.  Peter Frey is doing well mentally overall.  He has moments of sadness, but they pass.  He does not like being limited by his health and breathing. His anxiety is doing okay and he has not found that he needs the meds yet.  He went to bed at 4am today.  He has talked to doctor about it and has CPAP, but still not sleeping.  He is not compliant with his CPAP, he only tired it once.  We talked about trying melatonin for helping get to sleep. He has tried to stop napping. We talked about establishing a bed time routine to aid in sleep.     Expected Outcomes  Short: Attend HeartTrack stress management education to decrease stress. Long: Maintain exercise Post HeartTrack to keep stress at a minimum.  Short: [facility]  Long: Continue to cope well.  Short: Try to establish sleep routine and use CPAP.  Long: Continue to stay positive.     Interventions  Encouraged to attend Cardiac Rehabilitation for the exercise  Encouraged to attend Cardiac Rehabilitation for the exercise;Stress management education  -     Continue Psychosocial Services   Follow up required by staff  Follow up required by staff  -       Initial Review   Source of Stress Concerns  -  Chronic Illness;Unable to perform yard/household activities;Unable to participate in former interests or hobbies  -        Psychosocial Discharge (Final  Psychosocial Re-Evaluation): Psychosocial Re-Evaluation - 11/24/18 0947      Psychosocial Re-Evaluation   Current issues with  Current Stress Concerns;Current Anxiety/Panic;Current Sleep Concerns;Current Depression    Comments  Peter Frey is doing well mentally overall.  He has moments of  sadness, but they pass.  He does not like being limited by his health and breathing. His anxiety is doing okay and he has not found that he needs the meds yet.  He went to bed at 4am today.  He has talked to doctor about it and has CPAP, but still not sleeping.  He is not compliant with his CPAP, he only tired it once.  We talked about trying melatonin for helping get to sleep. He has tried to stop napping. We talked about establishing a bed time routine to aid in sleep.    Expected Outcomes  Short: Try to establish sleep routine and use CPAP.  Long: Continue to stay positive.       Vocational Rehabilitation: Provide vocational rehab assistance to qualifying candidates.   Vocational Rehab Evaluation & Intervention: Vocational Rehab - 08/25/18 1421      Initial Vocational Rehab Evaluation & Intervention   Assessment shows need for Vocational Rehabilitation  No       Education: Education Goals: Education classes will be provided on a variety of topics geared toward better understanding of heart health and risk factor modification. Participant will state understanding/return demonstration of topics presented as noted by education test scores.  Learning Barriers/Preferences: Learning Barriers/Preferences - 08/25/18 1421      Learning Barriers/Preferences   Learning Barriers  None    Learning Preferences  None       Education Topics:  AED/CPR: - Group verbal and written instruction with the use of models to demonstrate the basic use of the AED with the basic ABC's of resuscitation.   General Nutrition Guidelines/Fats and Fiber: -Group instruction provided by verbal, written material, models and posters  to present the general guidelines for heart healthy nutrition. Gives an explanation and review of dietary fats and fiber.   Cardiac Rehab from 12/01/2018 in [facility] Cardiac and Pulmonary Rehab  Date  12/01/18  Educator  [facility]  Instruction Review Code  1- Verbalizes Understanding      Controlling Sodium/Reading Food Labels: -Group verbal and written material supporting the discussion of sodium use in heart healthy nutrition. Review and explanation with models, verbal and written materials for utilization of the food label.   Exercise Physiology & General Exercise Guidelines: - Group verbal and written instruction with models to review the exercise physiology of the cardiovascular system and associated critical values. Provides general exercise guidelines with specific guidelines to those with heart or lung disease.    Aerobic Exercise & Resistance Training: - Gives group verbal and written instruction on the various components of exercise. Focuses on aerobic and resistive training programs and the benefits of this training and how to safely progress through these programs..   Cardiac Rehab from 12/01/2018 in [facility] Cardiac and Pulmonary Rehab  Date  11/17/18  Educator  jh  Instruction Review Code  1- Verbalizes Understanding      Flexibility, Balance, Mind/Body Relaxation: Provides group verbal/written instruction on the benefits of flexibility and balance training, including mind/body exercise modes such as yoga, pilates and tai chi.  Demonstration and skill practice provided.   Cardiac Rehab from 12/01/2018 in [facility] Cardiac and Pulmonary Rehab  Date  12/01/18  Educator  AS  Instruction Review Code  1- Verbalizes Understanding      Stress and Anxiety: - Provides group verbal and written instruction about the health risks of elevated stress and causes of high stress.  Discuss the correlation between heart/lung disease and anxiety and treatment options. Review healthy ways to manage  with  stress and anxiety.   Depression: - Provides group verbal and written instruction on the correlation between heart/lung disease and depressed mood, treatment options, and the stigmas associated with seeking treatment.   Anatomy & Physiology of the Heart: - Group verbal and written instruction and models provide basic cardiac anatomy and physiology, with the coronary electrical and arterial systems. Review of Valvular disease and Heart Failure   Cardiac Procedures: - Group verbal and written instruction to review commonly prescribed medications for heart disease. Reviews the medication, class of the drug, and side effects. Includes the steps to properly store meds and maintain the prescription regimen. (beta blockers and nitrates)   Cardiac Rehab from 12/01/2018 in [facility] Cardiac and Pulmonary Rehab  Date  11/17/18  Educator  mc  Instruction Review Code  1- Verbalizes Understanding      Cardiac Medications I: - Group verbal and written instruction to review commonly prescribed medications for heart disease. Reviews the medication, class of the drug, and side effects. Includes the steps to properly store meds and maintain the prescription regimen.   Cardiac Medications II: -Group verbal and written instruction to review commonly prescribed medications for heart disease. Reviews the medication, class of the drug, and side effects. (all other drug classes)   Cardiac Rehab from 11/03/2018 in [facility] Cardiac and Pulmonary Rehab  Date  11/03/18  Educator  [facility]  Instruction Review Code  1- Verbalizes Understanding       Go Sex-Intimacy & Heart Disease, Get SMART - Goal Setting: - Group verbal and written instruction through game format to discuss heart disease and the return to sexual intimacy. Provides group verbal and written material to discuss and apply goal setting through the application of the S.M.A.R.T. Method.   Cardiac Rehab from 12/01/2018 in [facility] Cardiac and Pulmonary Rehab  Date   11/17/18  Educator  mc  Instruction Review Code  1- Verbalizes Understanding      Other Matters of the Heart: - Provides group verbal, written materials and models to describe Stable Angina and Peripheral Artery. Includes description of the disease process and treatment options available to the cardiac patient.   Exercise & Equipment Safety: - Individual verbal instruction and demonstration of equipment use and safety with use of the equipment.   Cardiac Rehab from 08/29/2018 in [facility] Cardiac and Pulmonary Rehab  Date  08/29/18  Educator  [facility]  Instruction Review Code  1- Verbalizes Understanding      Infection Prevention: - Provides verbal and written material to individual with discussion of infection control including proper hand washing and proper equipment cleaning during exercise session.   Cardiac Rehab from 08/29/2018 in [facility] Cardiac and Pulmonary Rehab  Date  08/29/18  Educator  [facility]  Instruction Review Code  1- Verbalizes Understanding      Falls Prevention: - Provides verbal and written material to individual with discussion of falls prevention and safety.   Cardiac Rehab from 08/29/2018 in [facility] Cardiac and Pulmonary Rehab  Date  08/29/18  Educator  [facility]  Instruction Review Code  1- Verbalizes Understanding      Diabetes: - Individual verbal and written instruction to review signs/symptoms of diabetes, desired ranges of glucose level fasting, after meals and with exercise. Acknowledge that pre and post exercise glucose checks will be done for 3 sessions at entry of program.   Know Your Numbers and Risk Factors: -Group verbal and written instruction about important numbers in your health.  Discussion of what are risk factors and how  they play a role in the disease process.  Review of Cholesterol, Blood Pressure, Diabetes, and BMI and the role they play in your overall health.   Cardiac Rehab from 11/03/2018 in [facility] Cardiac and Pulmonary Rehab  Date  11/03/18  Educator  [facility]   Instruction Review Code  1- Verbalizes Understanding      Sleep Hygiene: -Provides group verbal and written instruction about how sleep can affect your health.  Define sleep hygiene, discuss sleep cycles and impact of sleep habits. Review good sleep hygiene tips.    Other: -Provides group and verbal instruction on various topics (see comments)   Knowledge Questionnaire Score: Knowledge Questionnaire Score - 08/29/18 1243      Knowledge Questionnaire Score   Pre Score  20/26   Education Focus: Angina, Heart Failure, Nutrition, Exercise      Core Components/Risk Factors/Patient Goals at Admission: Personal Goals and Risk Factors at Admission - 08/29/18 1244      Core Components/Risk Factors/Patient Goals on Admission    Weight Management  Yes;Weight Maintenance    Intervention  Weight Management: Develop a combined nutrition and exercise program designed to reach desired caloric intake, while maintaining appropriate intake of nutrient and fiber, sodium and fats, and appropriate energy expenditure required for the weight goal.;Weight Management: Provide education and appropriate resources to help participant work on and attain dietary goals.    Admit Weight  172 lb 8 oz (78.2 kg)    Goal Weight: Short Term  172 lb (78 kg)    Goal Weight: Long Term  172 lb (78 kg)    Expected Outcomes  Short Term: Continue to assess and modify interventions until short term weight is achieved;Long Term: Adherence to nutrition and physical activity/exercise program aimed toward attainment of established weight goal;Weight Maintenance: Understanding of the daily nutrition guidelines, which includes 25-35% calories from fat, 7% or less cal from saturated fats, less than '200mg'$  cholesterol, less than 1.5gm of sodium, & 5 or more servings of fruits and vegetables daily    Intervention  Provide education about signs/symptoms and action to take for hypo/hyperglycemia.;Provide education about proper nutrition,  including hydration, and aerobic/resistive exercise prescription along with prescribed medications to achieve blood glucose in normal ranges: Fasting glucose 65-99 mg/dL    Expected Outcomes  Short Term: Participant verbalizes understanding of the signs/symptoms and immediate care of hyper/hypoglycemia, proper foot care and importance of medication, aerobic/resistive exercise and nutrition plan for blood glucose control.;Long Term: Attainment of HbA1C < 7%.    Heart Failure  Yes    Intervention  Provide a combined exercise and nutrition program that is supplemented with education, support and counseling about heart failure. Directed toward relieving symptoms such as shortness of breath, decreased exercise tolerance, and extremity edema.    Expected Outcomes  Improve functional capacity of life;Short term: Attendance in program 2-3 days a week with increased exercise capacity. Reported lower sodium intake. Reported increased fruit and vegetable intake. Reports medication compliance.;Short term: Daily weights obtained and reported for increase. Utilizing diuretic protocols set by physician.;Long term: Adoption of self-care skills and reduction of barriers for early signs and symptoms recognition and intervention leading to self-care maintenance.    Hypertension  Yes    Intervention  Provide education on lifestyle modifcations including regular physical activity/exercise, weight management, moderate sodium restriction and increased consumption of fresh fruit, vegetables, and low fat dairy, alcohol moderation, and smoking cessation.;Monitor prescription use compliance.    Expected Outcomes  Short Term: Continued assessment and intervention until  BP is < 140/46m HG in hypertensive participants. < 130/850mHG in hypertensive participants with diabetes, heart failure or chronic kidney disease.;Long Term: Maintenance of blood pressure at goal levels.    Lipids  Yes    Intervention  Provide education and support  for participant on nutrition & aerobic/resistive exercise along with prescribed medications to achieve LDL '70mg'$ , HDL >'40mg'$ .    Expected Outcomes  Short Term: Participant states understanding of desired cholesterol values and is compliant with medications prescribed. Participant is following exercise prescription and nutrition guidelines.;Long Term: Cholesterol controlled with medications as prescribed, with individualized exercise RX and with personalized nutrition plan. Value goals: LDL < '70mg'$ , HDL > 40 mg.       Core Components/Risk Factors/Patient Goals Review:  Goals and Risk Factor Review    Row Name 10/06/18 1019 11/08/18 1010 11/10/18 0951 11/24/18 0944       Core Components/Risk Factors/Patient Goals Review   Personal Goals Review  Hypertension;Lipids;Other  Weight Management/Obesity;Improve shortness of breath with ADL's;Diabetes;Hypertension;Lipids  Weight Management/Obesity;Improve shortness of breath with ADL's;Diabetes;Hypertension;Lipids  Weight Management/Obesity;Improve shortness of breath with ADL's;Diabetes;Hypertension;Lipids    Review  Peter Brows taking all meds as directed.  He is trying to eat more vegetables and less fried food.  He is walking some outside class.He does check BP at home and it stays stable.  Zamere feels like he gets short of breath more now than he was after his first heart attack. His blood pressure has been good but was slightly low today. His lipids have been good. He takes his cholesterol pill at night before bed.  Peter Browas been doing well in rehab.  His weight is down some overall.  He has been compliant with his medications.  His pressures have been good in class but he does not check it at one.  We talked about getting cuff out and taking it at home.  Blood sugars are getting better.  [facility]  He is still having some SOB and has not noticed improvement yet.  Peter Brows doing well.  They cut his losartan in half and he feels  much better.  His blood pressure is no longer dropping when he checks it at home.  A1c is 6.2 and he is no longer taking anything for it.  His SOB is still linger but it is getting better. We talked using PLB to help manage his SOB.  His weight is rock solid steady!!    Expected Outcomes  Short - continue heart healthy changes Long - maintain heart healthy habits  Short: Attend HeartTrack regularly to improve shortness of breath with ADL's. Long: maintain independence with ADL's  Short: Get blood pressure cuff out and start checking at home.  Long: Continue to work on weight loss.  Short: Use PLB to manage breathing.  Long: Continue to manage risk factors.       Core Components/Risk Factors/Patient Goals at Discharge (Final Review):  Goals and Risk Factor Review - 11/24/18 0944      Core Components/Risk Factors/Patient Goals Review   Personal Goals Review  Weight Management/Obesity;Improve shortness of breath with ADL's;Diabetes;Hypertension;Lipids    Review  Peter Brows doing well.  They cut his losartan in half and he feels much better.  His blood pressure is no longer dropping when he checks it at home.  A1c is 6.2 and he is no longer taking anything for it.  His SOB is still linger but it is getting better.  We talked using PLB to help manage his SOB.  His weight is rock solid steady!!    Expected Outcomes  Short: Use PLB to manage breathing.  Long: Continue to manage risk factors.       ITP Comments: ITP Comments    Row Name 08/25/18 1430 08/29/18 1215 09/13/18 1107 09/21/18 0600 10/19/18 1333   ITP Comments  nitial Orientation Virtual Call completed.   EP/RD appt is on 8/17 Diagnosis documentation can be found in MEDIA tab [facility] records  Completed 6MWT, gym orientation, and RD evaluation. Initial ITP created and sent for review to Dr. Emily Filbert, Medical Director.  Returning call to pt. He called to leave [place] a message that he has cough and congestion.  They had a COVID test done and it was  negative.  He is taking some medicine now to feel better.  Encouraged him to stay home until he is symptom free for 48 hours.  30 Day review. Continue with ITP unless directed changes per Medical Director review.  Still out recuperating from cough/congestion  30 day review completed. ITP sent to Dr. Emily Filbert, Medical Director of Cardiac and Pulmonary Rehab. Continue with ITP unless changes are made by physician.  Department closed starting 10/2 until further notice by infection prevention and Health at Work teams for [place].   [place] Name 10/25/18 1044 11/01/18 1117 11/10/18 0945 11/16/18 0623 12/14/18 0937   ITP Comments  Called to check on pt.  He was admitted to hospital for vtach with two shocks.   He has a follow up on Friday with his cardiologist.  He is feeling better. He hopes to return next week.  Peter Frey returned to program after being out several weeks for [employer]. His ICD was readjusted to HR of 180.  HIs doctors at the [place] told him to resume his rehab  Peter Frey was in [place] ED yesterday per their order running test.  They released him home.  30 day review completed. Continue with ITP sent to Dr. Emily Filbert, Medical Director of Cardiac and Pulmonary Rehab for review , changes as needed and signature.  30 day review competed . ITP sent to Dr Emily Filbert for review, changes as needed and ITP approval signature.      Comments:

## 2018-12-15 ENCOUNTER — Encounter: Payer: No Typology Code available for payment source | Admitting: *Deleted

## 2018-12-15 ENCOUNTER — Other Ambulatory Visit: Payer: Self-pay

## 2018-12-15 DIAGNOSIS — I429 Cardiomyopathy, unspecified: Secondary | ICD-10-CM

## 2018-12-15 DIAGNOSIS — I255 Ischemic cardiomyopathy: Secondary | ICD-10-CM | POA: Diagnosis not present

## 2018-12-15 NOTE — Progress Notes (Signed)
Daily Session Note  Patient Details  Name: Peter Frey MRN: [id number] Date of Birth: April 14, 1950 Referring Provider:     Cardiac Rehab from 08/29/2018 in [facility] Cardiac and Pulmonary Rehab  Referring Provider  Nicholes Mango MD      Encounter Date: 12/15/2018  Check In: Session Check In - 12/15/18 0942      Check-In   Supervising physician immediately available to respond to emergencies  See telemetry face sheet for immediately available ER MD    Location  [facility]-Cardiac & Pulmonary Rehab    Staff Present  Heath Lark, RN, BSN, CCRP;Jeanna Durrell BS, Exercise Physiologist;Jessica Rosebud, MA, RCEP, CCRP, CCET    Virtual Visit  No    Medication changes reported      No    Fall or balance concerns reported     No    Warm-up and Cool-down  Performed on first and last piece of equipment    Resistance Training Performed  Yes    VAD Patient?  No    PAD/SET Patient?  No      Pain Assessment   Currently in Pain?  No/denies          Social History   Tobacco Use  Smoking Status Former Smoker  . Packs/day: 2.00  . Years: 43.00  . Pack years: 86.00  . Types: Cigarettes  . Quit date: 2015  . Years since quitting: 5.9  Smokeless Tobacco Never Used    Goals Met:  Independence with exercise equipment Exercise tolerated well No report of cardiac concerns or symptoms  Goals Unmet:  Not Applicable  Comments: Pt able to follow exercise prescription today without complaint.  Will continue to monitor for progression.    Dr. Emily Filbert is Medical Director for [place] and [facility].

## 2018-12-20 ENCOUNTER — Other Ambulatory Visit: Payer: Self-pay

## 2018-12-20 ENCOUNTER — Encounter: Payer: No Typology Code available for payment source | Admitting: *Deleted

## 2018-12-20 DIAGNOSIS — I429 Cardiomyopathy, unspecified: Secondary | ICD-10-CM

## 2018-12-20 DIAGNOSIS — I255 Ischemic cardiomyopathy: Secondary | ICD-10-CM | POA: Diagnosis not present

## 2018-12-20 NOTE — Progress Notes (Signed)
Daily Session Note  Patient Details  Name: Peter Frey MRN: [id number] Date of Birth: 1950/07/19 Referring Provider:     Cardiac Rehab from 08/29/2018 in [facility] Cardiac and Pulmonary Rehab  Referring Provider  Nicholes Mango MD      Encounter Date: 12/20/2018  Check In: Session Check In - 12/20/18 0952      Check-In   Supervising physician immediately available to respond to emergencies  See telemetry face sheet for immediately available ER MD    Location  [facility]-Cardiac & Pulmonary Rehab    Staff Present  Heath Lark, RN, BSN, CCRP;Amanda Sommer, BA, ACSM CEP, Exercise Physiologist;Joseph Hood RCP,RRT,BSRT    Virtual Visit  No    Medication changes reported      No    Fall or balance concerns reported     No    Warm-up and Cool-down  Performed on first and last piece of equipment    Resistance Training Performed  Yes    VAD Patient?  No    PAD/SET Patient?  No      Pain Assessment   Currently in Pain?  No/denies          Social History   Tobacco Use  Smoking Status Former Smoker  . Packs/day: 2.00  . Years: 43.00  . Pack years: 86.00  . Types: Cigarettes  . Quit date: 2015  . Years since quitting: 5.9  Smokeless Tobacco Never Used    Goals Met:  Independence with exercise equipment Exercise tolerated well No report of cardiac concerns or symptoms  Goals Unmet:  Not Applicable  Comments: Pt able to follow exercise prescription today without complaint.  Will continue to monitor for progression.    Dr. Emily Filbert is Medical Director for [place] and [facility].

## 2018-12-22 ENCOUNTER — Other Ambulatory Visit: Payer: Self-pay

## 2018-12-22 ENCOUNTER — Encounter: Payer: No Typology Code available for payment source | Admitting: *Deleted

## 2018-12-22 DIAGNOSIS — I429 Cardiomyopathy, unspecified: Secondary | ICD-10-CM

## 2018-12-22 DIAGNOSIS — I255 Ischemic cardiomyopathy: Secondary | ICD-10-CM | POA: Diagnosis not present

## 2018-12-22 NOTE — Progress Notes (Signed)
Daily Session Note  Patient Details  Name: RAYGEN DAHM MRN: [id number] Date of Birth: [birth year] Referring Provider:     Cardiac Rehab from 08/29/2018 in [facility] Cardiac and Pulmonary Rehab  Referring Provider  Nicholes Mango MD      Encounter Date: 12/22/2018  Check In: Session Check In - 12/22/18 0946      Check-In   Supervising physician immediately available to respond to emergencies  See telemetry face sheet for immediately available ER MD    Location  [facility]-Cardiac & Pulmonary Rehab    Staff Present  Heath Lark, RN, BSN, CCRP;Jeanna Durrell BS, Exercise Physiologist;Amanda Oletta Darter, BA, ACSM CEP, Exercise Physiologist    Virtual Visit  No    Medication changes reported      No    Fall or balance concerns reported     No    Warm-up and Cool-down  Performed on first and last piece of equipment    Resistance Training Performed  Yes    VAD Patient?  No    PAD/SET Patient?  No      Pain Assessment   Currently in Pain?  No/denies          Social History   Tobacco Use  Smoking Status Former Smoker  . Packs/day: 2.00  . Years: 43.00  . Pack years: 86.00  . Types: Cigarettes  . Quit date: 2015  . Years since quitting: 5.9  Smokeless Tobacco Never Used    Goals Met:  Independence with exercise equipment Exercise tolerated well No report of cardiac concerns or symptoms  Goals Unmet:  Not Applicable  Comments: Pt able to follow exercise prescription today without complaint.  Will continue to monitor for progression.    Dr. Emily Filbert is Medical Director for [place] and [facility].

## 2018-12-27 ENCOUNTER — Encounter: Payer: No Typology Code available for payment source | Admitting: *Deleted

## 2018-12-27 ENCOUNTER — Other Ambulatory Visit: Payer: Self-pay

## 2018-12-27 DIAGNOSIS — I255 Ischemic cardiomyopathy: Secondary | ICD-10-CM | POA: Diagnosis not present

## 2018-12-27 DIAGNOSIS — I429 Cardiomyopathy, unspecified: Secondary | ICD-10-CM

## 2018-12-27 NOTE — Progress Notes (Signed)
Daily Session Note  Patient Details  Name: Peter Frey MRN: [id number] Date of Birth: Jan 01, 1951 Referring Provider:     Cardiac Rehab from 08/29/2018 in [facility] Cardiac and Pulmonary Rehab  Referring Provider  Peter Mango MD      Encounter Date: 12/27/2018  Check In: Session Check In - 12/27/18 1043      Check-In   Supervising physician immediately available to respond to emergencies  See telemetry face sheet for immediately available ER MD    Location  [facility]-Cardiac & Pulmonary Rehab    Staff Present  Heath Lark, RN, BSN, CCRP;Joseph Hood RCP,RRT,BSRT;Amanda Oletta Darter, [place], ACSM CEP, Exercise Physiologist    Virtual Visit  No    Medication changes reported      No    Fall or balance concerns reported     No    Warm-up and Cool-down  Performed on first and last piece of equipment    Resistance Training Performed  Yes    VAD Patient?  No    PAD/SET Patient?  No      Pain Assessment   Currently in Pain?  No/denies          Social History   Tobacco Use  Smoking Status Former Smoker  . Packs/day: 2.00  . Years: 43.00  . Pack years: 86.00  . Types: Cigarettes  . Quit date: 2015  . Years since quitting: 5.9  Smokeless Tobacco Never Used    Goals Met:  Independence with exercise equipment Exercise tolerated well No report of cardiac concerns or symptoms  Goals Unmet:  Not Applicable  Comments: Pt able to follow exercise prescription today without complaint.  Will continue to monitor for progression.    Dr. Emily Filbert is Medical Director for [name] and [facility].

## 2018-12-29 ENCOUNTER — Encounter: Payer: No Typology Code available for payment source | Admitting: *Deleted

## 2018-12-29 ENCOUNTER — Other Ambulatory Visit: Payer: Self-pay

## 2018-12-29 DIAGNOSIS — I429 Cardiomyopathy, unspecified: Secondary | ICD-10-CM

## 2018-12-29 DIAGNOSIS — I255 Ischemic cardiomyopathy: Secondary | ICD-10-CM | POA: Diagnosis not present

## 2018-12-29 NOTE — Progress Notes (Signed)
Daily Session Note  Patient Details  Name: Peter Frey MRN: [id number] Date of Birth: 1950/11/16 Referring Provider:     Cardiac Rehab from 08/29/2018 in [facility] Cardiac and Pulmonary Rehab  Referring Provider  Nicholes Mango MD      Encounter Date: 12/29/2018  Check In: Session Check In - 12/29/18 1007      Check-In   Supervising physician immediately available to respond to emergencies  See telemetry face sheet for immediately available ER MD    Location  [facility]-Cardiac & Pulmonary Rehab    Staff Present  Heath Lark, RN, BSN, CCRP;Amanda Sommer, BA, ACSM CEP, Exercise Physiologist    Virtual Visit  No    Medication changes reported      No    Fall or balance concerns reported     No    Warm-up and Cool-down  Performed on first and last piece of equipment    Resistance Training Performed  Yes    VAD Patient?  No    PAD/SET Patient?  No      Pain Assessment   Currently in Pain?  No/denies          Social History   Tobacco Use  Smoking Status Former Smoker  . Packs/day: 2.00  . Years: 43.00  . Pack years: 86.00  . Types: Cigarettes  . Quit date: 2015  . Years since quitting: 5.9  Smokeless Tobacco Never Used    Goals Met:  Independence with exercise equipment Exercise tolerated well No report of cardiac concerns or symptoms  Goals Unmet:  Not Applicable  Comments: Pt able to follow exercise prescription today without complaint.  Will continue to monitor for progression.    Dr. Emily Filbert is Medical Director for [place] and [facility].

## 2019-01-03 ENCOUNTER — Other Ambulatory Visit: Payer: Self-pay

## 2019-01-03 ENCOUNTER — Encounter: Payer: No Typology Code available for payment source | Admitting: *Deleted

## 2019-01-03 DIAGNOSIS — I429 Cardiomyopathy, unspecified: Secondary | ICD-10-CM

## 2019-01-03 DIAGNOSIS — I255 Ischemic cardiomyopathy: Secondary | ICD-10-CM | POA: Diagnosis not present

## 2019-01-03 NOTE — Progress Notes (Signed)
Daily Session Note  Patient Details  Name: Peter Frey MRN: [id number] Date of Birth: 11-10-1950 Referring Provider:     Cardiac Rehab from 08/29/2018 in [facility] Cardiac and Pulmonary Rehab  Referring Provider  Nicholes Mango MD      Encounter Date: 01/03/2019  Check In: Session Check In - 01/03/19 1110      Check-In   Supervising physician immediately available to respond to emergencies  See telemetry face sheet for immediately available ER MD    Location  [facility]-Cardiac & Pulmonary Rehab    Staff Present  Heath Lark, RN, BSN, CCRP;Amanda Sommer, BA, ACSM CEP, Exercise Physiologist;Joseph Hood RCP,RRT,BSRT    Virtual Visit  No    Medication changes reported      No    Fall or balance concerns reported     No    Warm-up and Cool-down  Performed on first and last piece of equipment    Resistance Training Performed  Yes    VAD Patient?  No    PAD/SET Patient?  No      Pain Assessment   Currently in Pain?  No/denies          Social History   Tobacco Use  Smoking Status Former Smoker  . Packs/day: 2.00  . Years: 43.00  . Pack years: 86.00  . Types: Cigarettes  . Quit date: 2015  . Years since quitting: 5.9  Smokeless Tobacco Never Used    Goals Met:  Independence with exercise equipment Exercise tolerated well No report of cardiac concerns or symptoms  Goals Unmet:  Not Applicable  Comments: Pt able to follow exercise prescription today without complaint.  Will continue to monitor for progression.    Dr. Emily Filbert is Medical Director for [place] and [facility].

## 2019-01-05 ENCOUNTER — Ambulatory Visit: Payer: Medicare Other

## 2019-01-11 ENCOUNTER — Encounter: Payer: Self-pay | Admitting: *Deleted

## 2019-01-11 DIAGNOSIS — I429 Cardiomyopathy, unspecified: Secondary | ICD-10-CM

## 2019-01-11 NOTE — Progress Notes (Signed)
Cardiac Individual Treatment Plan  Patient Details  Name: Peter Frey MRN: [id number] Date of Birth: 02/07/1950 Referring Provider:     Cardiac Rehab from 08/29/2018 in [facility]  Referring Provider  Patel, Sonal MD      Initial Encounter Date:    Cardiac Rehab from 08/29/2018 in [facility]  Date  08/29/18      Visit Diagnosis: Cardiomyopathy, unspecified type (HCC)  Patient's Home Medications on Admission:  Current Outpatient Medications:  .  albuterol (VENTOLIN HFA) 108 (90 Base) MCG/ACT inhaler, Inhale into the lungs., Disp: , Rfl:  .  aspirin 81 MG tablet, Take 81 mg by mouth daily., Disp: , Rfl:  .  furosemide (LASIX) 40 MG tablet, Take 40 mg by mouth daily. , Disp: , Rfl:  .  gabapentin (NEURONTIN) 300 MG capsule, Take 300 mg by mouth 3 (three) times daily., Disp: , Rfl:  .  losartan (COZAAR) 50 MG tablet, Take 50 mg by mouth daily. , Disp: , Rfl:  .  metoprolol (LOPRESSOR) 50 MG tablet, Take 50 mg by mouth 2 (two) times daily., Disp: , Rfl:  .  sildenafil (VIAGRA) 50 MG tablet, Take by mouth., Disp: , Rfl:  .  simvastatin (ZOCOR) 20 MG tablet, Take 20 mg by mouth daily., Disp: , Rfl:  .  spironolactone (ALDACTONE) 25 MG tablet, Take 25 mg by mouth daily. , Disp: , Rfl:  .  TIOTROPIUM BROMIDE-OLODATEROL IN, Inhale 2 Inhalers into the lungs daily., Disp: , Rfl:   Past Medical History: Past Medical History:  Diagnosis Date  . CHF (congestive heart failure) (HCC)   . COPD (chronic obstructive pulmonary disease) (HCC)   . Coronary artery disease   . MI (myocardial infarction) (HCC)     Tobacco Use: Social History   Tobacco Use  Smoking Status Former Smoker  . Packs/day: 2.00  . Years: 43.00  . Pack years: 86.00  . Types: Cigarettes  . Quit date: 2015  . Years since quitting: 6.0  Smokeless Tobacco Never Used    Labs: Recent Review Flowsheet Data    Labs for ITP Cardiac and Pulmonary Rehab Latest Ref Rng & Units  10/18/2018   Cholestrol 0 - 200 mg/dL 94   LDLCALC 0 - 99 mg/dL 29   HDL >40 mg/dL 50   Trlycerides <150 mg/dL 73       Exercise Target Goals: Exercise Program Goal: Individual exercise prescription set using results from initial 6 min walk test and THRR while considering  patient's activity barriers and safety.   Exercise Prescription Goal: Initial exercise prescription builds to 30-45 minutes a day of aerobic activity, 2-3 days per week.  Home exercise guidelines will be given to patient during program as part of exercise prescription that the participant will acknowledge.  Activity Barriers & Risk Stratification: Activity Barriers & Cardiac Risk Stratification - 08/29/18 1216      Activity Barriers & Cardiac Risk Stratification   Activity Barriers  Shortness of Breath;Deconditioning;Muscular Weakness    Cardiac Risk Stratification  High       6 Minute Walk: 6 Minute Walk    Row Name 08/29/18 1216         6 Minute Walk   Phase  Initial     Distance  1270 feet     Walk Time  6 minutes     # of Rest Breaks  0     MPH  2.41     METS  3.35       RPE  12     Perceived Dyspnea   2     VO2 Peak  11.72     Symptoms  Yes (comment)     Comments  Calf pain 9/10, SOB     Resting HR  71 bpm     Resting BP  124/62     Resting Oxygen Saturation   99 %     Exercise Oxygen Saturation  during 6 min walk  95 %     Max Ex. HR  95 bpm     Max Ex. BP  128/64     2 Minute Post BP  108/64        Oxygen Initial Assessment:   Oxygen Re-Evaluation:   Oxygen Discharge (Final Oxygen Re-Evaluation):   Initial Exercise Prescription: Initial Exercise Prescription - 08/29/18 1200      Date of Initial Exercise RX and Referring Provider   Date  08/29/18    Referring Provider  Nicholes Mango MD      Treadmill   MPH  2.3    Grade  0.5    Minutes  15    METs  2.92      Recumbant Bike   Level  2    RPM  50    Watts  33    Minutes  15    METs  2.5      NuStep   Level  2     SPM  80    Minutes  15    METs  2.5      Recumbant Elliptical   Level  1    RPM  50    Minutes  15    METs  2.5      Prescription Details   Frequency (times per week)  2    Duration  Progress to 30 minutes of continuous aerobic without signs/symptoms of physical distress      Intensity   THRR 40-80% of Max Heartrate  104-137    Ratings of Perceived Exertion  11-13    Perceived Dyspnea  0-4      Progression   Progression  Continue to progress workloads to maintain intensity without signs/symptoms of physical distress.      Resistance Training   Training Prescription  Yes    Weight  4 lbs    Reps  10-15       Perform Capillary Blood Glucose checks as needed.  Exercise Prescription Changes: Exercise Prescription Changes    Row Name 08/29/18 1200 09/02/18 1000 09/07/18 1100 10/06/18 1000 10/25/18 1000     Response to Exercise   Blood Pressure (Admit)  124/62  106/66  100/58  --  122/64   Blood Pressure (Exercise)  128/64  130/64  132/80  --  124/64   Blood Pressure (Exit)  108/64  110/70  132/70  --  108/56   Heart Rate (Admit)  71 bpm  80 bpm  69 bpm  --  70 bpm   Heart Rate (Exercise)  95 bpm  101 bpm  104 bpm  --  95 bpm   Heart Rate (Exit)  71 bpm  70 bpm  66 bpm  --  76 bpm   Oxygen Saturation (Admit)  99 %  --  --  --  --   Oxygen Saturation (Exercise)  95 %  --  --  --  --   Rating of Perceived Exertion (Exercise)  _0 --  12  Perceived Dyspnea (Exercise)  2  --  --  --  --   Symptoms  calves buring 9/10, SOB  --  --  --  none   Comments  walk test results  --  --  --  --   Duration  --  Progress to 30 minutes of  aerobic without signs/symptoms of physical distress  Continue with 30 min of aerobic exercise without signs/symptoms of physical distress.  --  Continue with 30 min of aerobic exercise without signs/symptoms of physical distress.   Intensity  --  THRR unchanged  THRR unchanged  --  THRR unchanged     Progression   Progression  --  Continue to  progress workloads to maintain intensity without signs/symptoms of physical distress.  Continue to progress workloads to maintain intensity without signs/symptoms of physical distress.  --  Continue to progress workloads to maintain intensity without signs/symptoms of physical distress.   Average METs  --  2.4  2.9  --  2.68     Resistance Training   Training Prescription  --  Yes  Yes  --  Yes   Weight  --  4 lbs  4 lb  --  4 lbs   Reps  --  10-15  10-15  --  10-15     Interval Training   Interval Training  --  --  No  --  No     Treadmill   MPH  --  2  2  --  2.5   Grade  --  0  0  --  0   Minutes  --  15  15  --  15   METs  --  2.5  2.5  --  2.91     NuStep   Level  --  2  5  --  5   SPM  --  80  80  --  --   Minutes  --  15  15  --  15   METs  --  2.9  3.3  --  2.7     Recumbant Elliptical   Level  --  1  --  --  2.5   RPM  --  50  --  --  --   Minutes  --  15  --  --  15   METs  --  1.6  --  --  2.2     Home Exercise Plan   Plans to continue exercise at  --  --  --  Home (comment) considering gym  Home (comment) considering gym   Frequency  --  --  --  Add 2 additional days to program exercise sessions.  Add 2 additional days to program exercise sessions.   Initial Home Exercises Provided  --  --  --  10/06/18  10/06/18   Row Name 11/03/18 1600 11/16/18 1300 12/02/18 1200 12/15/18 1000 12/30/18 1300     Response to Exercise   Blood Pressure (Admit)  110/70  122/56  110/64  112/64  124/64   Blood Pressure (Exercise)  138/62  120/70  128/76  120/60  126/66   Blood Pressure (Exit)  100/62  116/66  102/60  100/60  124/60   Heart Rate (Admit)  68 bpm  76 bpm  82 bpm  70 bpm  58 bpm   Heart Rate (Exercise)  106 bpm  108 bpm  104 bpm  107 bpm  84 bpm   Heart Rate (Exit)  67 bpm  80 bpm  68 bpm  73 bpm  66 bpm   Rating of Perceived Exertion (Exercise)  _0 Symptoms  none  none  none  none  none   Duration  Continue with 30 min of aerobic exercise without  signs/symptoms of physical distress.  Continue with 30 min of aerobic exercise without signs/symptoms of physical distress.  Continue with 30 min of aerobic exercise without signs/symptoms of physical distress.  Continue with 30 min of aerobic exercise without signs/symptoms of physical distress.  Continue with 30 min of aerobic exercise without signs/symptoms of physical distress.   Intensity  THRR unchanged  THRR unchanged  THRR unchanged  THRR unchanged  THRR unchanged     Progression   Progression  Continue to progress workloads to maintain intensity without signs/symptoms of physical distress.  Continue to progress workloads to maintain intensity without signs/symptoms of physical distress.  Continue to progress workloads to maintain intensity without signs/symptoms of physical distress.  Continue to progress workloads to maintain intensity without signs/symptoms of physical distress.  Continue to progress workloads to maintain intensity without signs/symptoms of physical distress.   Average METs  2.5  2.91  2.6  2.77  2.5     Resistance Training   Training Prescription  Yes  Yes  Yes  Yes  Yes   Weight  4 lb  4 lbs  5 lb  5 lb  5 lb   Reps  10-15  10-15  10-15  10-15  10-15     Interval Training   Interval Training  No  No  No  No  No     Treadmill   MPH  _1 --  --   Grade  0  0  0  --  --   Minutes  _2 --  --   METs  2.53  2.53  2.53  --  --     Recumbant Bike   Level  --  3  --  3  --   Minutes  --  15  --  15  --   METs  --  3.2  --  3.3  --     NuStep   Level  _3 Minutes  _4 METs  2.3 T5 - alternate station  3  2.8  2.8  3     Recumbant Elliptical   Level  --  --  _5 Minutes  --  --  _6 METs  --  --  2.2  2.2  2     Home Exercise Plan   Plans to continue exercise at  Home (comment) considering gym  Home (comment) considering gym  Home (comment) considering gym  Home (comment) considering gym  Home (comment)  considering gym   Frequency  Add 2 additional days to program exercise sessions.  Add 2 additional days to program exercise sessions.  Add 2 additional days to program exercise sessions.  Add 2 additional days to program exercise sessions.  Add 2 additional days to program exercise sessions.   Initial Home Exercises Provided  10/06/18  10/06/18  10/06/18  10/06/18  10/06/18      Exercise Comments: Exercise Comments    Row Name 08/30/18  1012 09/23/18 1301         Exercise Comments  First full day of exercise!  Patient was oriented to gym and equipment including functions, settings, policies, and procedures.  Patient's individual exercise prescription and treatment plan were reviewed.  All starting workloads were established based on the results of the 6 minute walk test done at initial orientation visit.  The plan for exercise progression was also introduced and progression will be customized based on patient's performance and goals.  [name] has been out since last review due to illness         Exercise Goals and Review: Exercise Goals    Row Name 08/29/18 1241             Exercise Goals   Increase Physical Activity  Yes       Intervention  Provide advice, education, support and counseling about physical activity/exercise needs.;Develop an individualized exercise prescription for aerobic and resistive training based on initial evaluation findings, risk stratification, comorbidities and participant's personal goals.       Expected Outcomes  Short Term: Attend rehab on a regular basis to increase amount of physical activity.;Long Term: Add in home exercise to make exercise part of routine and to increase amount of physical activity.;Long Term: Exercising regularly at least 3-5 days a week.       Increase Strength and Stamina  Yes       Intervention  Provide advice, education, support and counseling about physical activity/exercise needs.;Develop an individualized exercise prescription for  aerobic and resistive training based on initial evaluation findings, risk stratification, comorbidities and participant's personal goals.       Expected Outcomes  Short Term: Increase workloads from initial exercise prescription for resistance, speed, and METs.;Short Term: Perform resistance training exercises routinely during rehab and add in resistance training at home;Long Term: Improve cardiorespiratory fitness, muscular endurance and strength as measured by increased METs and functional capacity (6MWT)       Able to understand and use rate of perceived exertion (RPE) scale  Yes       Intervention  Provide education and explanation on how to use RPE scale       Expected Outcomes  Short Term: Able to use RPE daily in rehab to express subjective intensity level;Long Term:  Able to use RPE to guide intensity level when exercising independently       Able to understand and use Dyspnea scale  Yes       Intervention  Provide education and explanation on how to use Dyspnea scale       Expected Outcomes  Short Term: Able to use Dyspnea scale daily in rehab to express subjective sense of shortness of breath during exertion;Long Term: Able to use Dyspnea scale to guide intensity level when exercising independently       Knowledge and understanding of Target Heart Rate Range (THRR)  Yes       Intervention  Provide education and explanation of THRR including how the numbers were predicted and where they are located for reference       Expected Outcomes  Short Term: Able to state/look up THRR;Short Term: Able to use daily as guideline for intensity in rehab;Long Term: Able to use THRR to govern intensity when exercising independently       Able to check pulse independently  Yes       Intervention  Provide education and demonstration on how to check pulse in carotid and radial arteries.;Review the importance of  being able to check your own pulse for safety during independent exercise       Expected Outcomes   Short Term: Able to explain why pulse checking is important during independent exercise;Long Term: Able to check pulse independently and accurately       Understanding of Exercise Prescription  Yes       Intervention  Provide education, explanation, and written materials on patient's individual exercise prescription       Expected Outcomes  Short Term: Able to explain program exercise prescription;Long Term: Able to explain home exercise prescription to exercise independently          Exercise Goals Re-Evaluation : Exercise Goals Re-Evaluation    [place] Name 08/30/18 1012 09/02/18 1024 09/07/18 1111 10/25/18 1044 11/03/18 1614     Exercise Goal Re-Evaluation   Exercise Goals Review  Increase Physical Activity;Increase Strength and Stamina;Able to understand and use rate of perceived exertion (RPE) scale;Able to understand and use Dyspnea scale;Knowledge and understanding of Target Heart Rate Range (THRR);Able to check pulse independently;Understanding of Exercise Prescription  Increase Physical Activity;Increase Strength and Stamina;Able to understand and use rate of perceived exertion (RPE) scale;Knowledge and understanding of Target Heart Rate Range (THRR);Able to check pulse independently;Understanding of Exercise Prescription  Increase Physical Activity;Increase Strength and Stamina;Able to understand and use rate of perceived exertion (RPE) scale;Knowledge and understanding of Target Heart Rate Range (THRR);Able to check pulse independently;Understanding of Exercise Prescription  --  Increase Physical Activity;Increase Strength and Stamina;Able to understand and use rate of perceived exertion (RPE) scale;Knowledge and understanding of Target Heart Rate Range (THRR);Able to check pulse independently;Understanding of Exercise Prescription   Comments  Reviewed RPE scale, THR and program prescription with pt today.  Pt voiced understanding and was given a copy of goals to take home.  [name] has done well  in his first week of HT.  He has a very positive attitude and seems to enjoy exercise.  The TM has been most challenging  [name] has increased level on NS.  Staff will continue to encourage progress.  [name] has been out since 9/24.  This is Davids first week back in almost a month.  He is building  back to previous levels.   Expected Outcomes  Short: Use RPE daily to regulate intensity. Long: Follow program prescription in THR.  Short - attend HT consistently Long - improve overall MET level  Short - increase TM level Long - improve overall MET level  --  Short - get back on track consistently Long - increase MET level   Row Name 11/10/18 0955 11/16/18 1336 11/24/18 0941 12/02/18 1223 12/15/18 1020     Exercise Goal Re-Evaluation   Exercise Goals Review  Increase Physical Activity;Increase Strength and Stamina;Understanding of Exercise Prescription  Increase Physical Activity;Increase Strength and Stamina;Understanding of Exercise Prescription  Increase Physical Activity;Increase Strength and Stamina;Understanding of Exercise Prescription  Increase Physical Activity;Increase Strength and Stamina;Able to understand and use rate of perceived exertion (RPE) scale;Able to understand and use Dyspnea scale;Knowledge and understanding of Target Heart Rate Range (THRR);Able to check pulse independently;Understanding of Exercise Prescription  Increase Physical Activity;Increase Strength and Stamina;Understanding of Exercise Prescription   Comments  [name] has been doing well in rehab.  He has not done much at home this month with everything that is going on.  He is still having some SOB.  He has not noticed an improvement with strength and stamina yet.  He is able to do what he wants but modifies it some.  [name] continues to do well in rehab.  He has been having some issues with his blood pressures at home.  He is on level 4 on the NuStep.  We will continue to monitor his progress.  [name] is doing well in rehab.  We are  trying increase his mobility on the treadmill.  We talked about doing the SET program after graduation.  He has not been doing his home exercise.  He is going to try to get back to it.  He usually walks at home but since his medical scare he backed off.  He is feeling better now.  [name] continues to do well.  He attends consistently and works at proper RPE when he is here.  [name] continues to do well in rehab.  He is on level 4 for NuStep and getting 3.3 METs for bike.  We will continue to monitor his progress.   Expected Outcomes  Short: Get back to exercising on off days.  Long: Continue to improve stamina.  Short: Add incline on treadmill  Long: Continue increase activity levels.  Short: Get back to walking at home.  Long[facility].  Short - add walking outside HT sessions Long : maintain exercise  Short: Talk about adding in intervals. Long: Continue to improve stamina.   [place] Name 12/29/18 1132 12/30/18 1336           Exercise Goal Re-Evaluation   Exercise Goals Review  Increase Physical Activity;Increase Strength and Stamina;Understanding of Exercise Prescription  Increase Physical Activity;Increase Strength and Stamina;Able to understand and use rate of perceived exertion (RPE) scale;Knowledge and understanding of Target Heart Rate Range (THRR);Able to check pulse independently;Understanding of Exercise Prescription      Comments  [name] continues to do well in rehab.  He has been walking some at home.  He is building back up.  He is feeling stronger and has more stamina.  [name] has tried the TM and elliptical.  He had some claudication pain on the TM and elliptical was challenging for him.      Expected Outcomes  Short: Continue to build exercise back up at home. Long: Continue to improve stamina.  Short - continue to try TM and elliptical  Long : build overall stamina         Discharge Exercise Prescription (Final Exercise Prescription Changes): Exercise Prescription  Changes - 12/30/18 1300      Response to Exercise   Blood Pressure (Admit)  124/64    Blood Pressure (Exercise)  126/66    Blood Pressure (Exit)  124/60    Heart Rate (Admit)  58 bpm    Heart Rate (Exercise)  84 bpm    Heart Rate (Exit)  66 bpm    Rating of Perceived Exertion (Exercise)  13    Symptoms  none    Duration  Continue with 30 min of aerobic exercise without signs/symptoms of physical distress.    Intensity  THRR unchanged      Progression   Progression  Continue to progress workloads to maintain intensity without signs/symptoms of physical distress.    Average METs  2.5      Resistance Training   Training Prescription  Yes    Weight  5 lb    Reps  10-15      Interval Training   Interval Training  No      NuStep   Level  5    Minutes  15    METs  3      Recumbant Elliptical   Level  2    Minutes  15    METs  2      Home Exercise Plan   Plans to continue exercise at  Home (comment)   considering gym   Frequency  Add 2 additional days to program exercise sessions.    Initial Home Exercises Provided  10/06/18       Nutrition:  Target Goals: Understanding of nutrition guidelines, daily intake of sodium <1565m, cholesterol <2043m calories 30% from fat and 7% or less from saturated fats, daily to have 5 or more servings of fruits and vegetables.  Biometrics: Pre Biometrics - 08/29/18 1242      Pre Biometrics   Height  6' (1.829 m)    Weight  172 lb 8 oz (78.2 kg)    BMI (Calculated)  23.39    Single Leg Stand  30 seconds        Nutrition Therapy Plan and Nutrition Goals: Nutrition Therapy & Goals - 08/29/18 1200      Nutrition Therapy   Diet  low Na, HH diet    Protein (specify units)  62g    Fiber  30 grams    Whole Grain Foods  3 servings    Saturated Fats  12 max. grams    Fruits and Vegetables  5 servings/day    Sodium  1.5 grams      Personal Nutrition Goals   Nutrition Goal  ST: when pt does not have breakfast, have mid-morning  protein snack LT: gain energy,  reduce SOB    Comments  Pt reports eating soul food mostly. He reports loving vegetables, disliking bread, eating beans and chicken (mostly baked, but with skin- will sometimes have fried chicken), likes to use butter and th[employer] - will have salads a couple times a week, will have fish 1x/week, 1 egg 2-3x/week, B (eggs, cheese and sausage) a couple times a week. Pt will normall have just dinner around 3pm (if pt has snack too close to dinner will not eat) - discussed a protein rich mid-morning snack (discussed healthy options). Pt reports that he drinks mountain dew mostly during the day. Discussed HH eating.      Intervention Plan   Intervention  Prescribe, educate and counsel regarding individualized specific dietary modifications aiming towards targeted core components such as weight, hypertension, lipid management, diabetes, heart failure and other comorbidities.;Nutrition handout(s) given to patient.    Expected Outcomes  Short Term Goal: Understand basic principles of dietary content, such as calories, fat, sodium, cholesterol and nutrients.;Short Term Goal: A plan has been developed with personal nutrition goals set during dietitian appointment.;Long Term Goal: Adherence to prescribed nutrition plan.       Nutrition Assessments: Nutrition Assessments - 08/29/18 1200      MEDFICTS Scores   Pre Score  43       Nutrition Goals Re-Evaluation: Nutrition Goals Re-Evaluation    Row Name 10/04/18 1016 11/08/18 1015 11/15/18 1138 12/06/18 1012 12/29/18 1007     Goals   Current Weight  --  169 lb (76.7 kg)  --  --  --   Nutrition Goal  ST: when pt does not have breakfast, have mid-morning protein snack LT: gain energy,  reduce SOB  Maintain weight. Drink more water.  Maintain weight. Drink more water.  ST: continue with curent diet LT: get back to baseline  ST: continue with curent diet LT: get back to baseline  Comment  Pt reports eating the same  way since the last time we spoke, discussed the importance of getting enough protein and mechanical eating when he is not hungry.  Patient states that he does not drink much water. He has at least 3 regular [employer] a day. He does not check his blood sugar at home. He is eating more baked goods and intaking more vegetables. Spokes to patient about his low blood pressure and informed him that he may need to drink more water.  Continue with current changes  Pt reports drinking more water, currently no sodas in the house. Pt reports no new changes in diet and would like to continue with current diet.  Pt reports no new changes in diet and would like to continue with current diet.   Expected Outcome  ST: when pt does not have breakfast, have mid-morning protein snack LT: gain energy,  reduce SOB  Short:cut back on mountain dew and drink water. Long: maintain drinking water and drink mountain dew seldome.  Short:cut back on mountain dew and drink water. Long: maintain drinking water and drink mountain dew seldome.  ST: continue with curent diet LT: get back to baseline  ST: continue with curent diet LT: get back to baseline      Nutrition Goals Discharge (Final Nutrition Goals Re-Evaluation): Nutrition Goals Re-Evaluation - 12/29/18 1007      Goals   Nutrition Goal  ST: continue with curent diet LT: get back to baseline    Comment  Pt reports no new changes in diet and would like to continue with current diet.    Expected Outcome  ST: continue with curent diet LT: get back to baseline       Psychosocial: Target Goals: Acknowledge presence or absence of significant depression and/or stress, maximize coping skills, provide positive support system. Participant is able to verbalize types and ability to use techniques and skills needed for reducing stress and depression.   Initial Review & Psychosocial Screening: Initial Psych Review & Screening - 08/25/18 1418      Initial Review   Current issues  with  None Identified      Family Dynamics   Good Support System?  Yes   Wife and family     Barriers   Psychosocial barriers to participate in program  There are no identifiable barriers or psychosocial needs.;The patient should benefit from training in stress management and relaxation.      Screening Interventions   Interventions  Encouraged to exercise;To provide support and resources with identified psychosocial needs;Provide feedback about the scores to participant    Expected Outcomes  Short Term goal: Utilizing psychosocial counselor, staff and physician to assist with identification of specific Stressors or current issues interfering with healing process. Setting desired goal for each stressor or current issue identified.;Long Term Goal: Stressors or current issues are controlled or eliminated.;Short Term goal: Identification and review with participant of any Quality of Life or Depression concerns found by scoring the questionnaire.;Long Term goal: The participant improves quality of Life and PHQ9 Scores as seen by post scores and/or verbalization of changes       Quality of Life Scores:  Quality of Life - 08/29/18 1242      Quality of Life   Select  Quality of Life      Quality of Life Scores   Health/Function Pre  15.07 %    Socioeconomic Pre  20.36 %    Psych/Spiritual Pre  23.57 %  Family Pre  28.8 %    GLOBAL Pre  20.08 %      Scores of 19 and below usually indicate a poorer quality of life in these areas.  A difference of  2-3 points is a clinically meaningful difference.  A difference of 2-3 points in the total score of the Quality of Life Index has been associated with significant improvement in overall quality of life, self-image, physical symptoms, and general health in studies assessing change in quality of life.  PHQ-9: Recent Review Flowsheet Data    Depression screen [facility] 2/9 08/29/2018   Decreased Interest 0   Down, Depressed, Hopeless 0   PHQ - 2 Score 0    Altered sleeping 0   Tired, decreased energy 3   Change in appetite 0   Feeling bad or failure about yourself  0   Trouble concentrating 0   Moving slowly or fidgety/restless 0   Suicidal thoughts 0   PHQ-9 Score 3   Difficult doing work/chores Not difficult at all     Interpretation of Total Score  Total Score Depression Severity:  1-4 = Minimal depression, 5-9 = Mild depression, 10-14 = Moderate depression, 15-19 = Moderately severe depression, 20-27 = Severe depression   Psychosocial Evaluation and Intervention: Psychosocial Evaluation - 08/25/18 1426      Psychosocial Evaluation & Interventions   Interventions  Encouraged to exercise with the program and follow exercise prescription    Comments  Yaseen has no barriers to entering the program. He is eager to start the program so he can build up his stamina.  He stated that he has seen a psychiatrist this year  and reports he does not feel depressed. He states he is doing fine dealing with the fact he has heart disease and the severity of his disease.    Expected Outcomes  Danarius will continue to deal well with his heart disease.  He will be able to benefit from the program to help him feel stronger and more able to meet his daily routines.    Continue Psychosocial Services   Follow up required by staff       Psychosocial Re-Evaluation: Psychosocial Re-Evaluation    [place] Name 11/08/18 1005 11/10/18 0946 11/24/18 0947 12/29/18 1135       Psychosocial Re-Evaluation   Current issues with  Current Sleep Concerns;Current Anxiety/Panic  Current Stress Concerns;Current Anxiety/Panic;Current Sleep Concerns  Current Stress Concerns;Current Anxiety/Panic;Current Sleep Concerns;Current Depression  Current Stress Concerns;Current Anxiety/Panic;Current Sleep Concerns;Current Depression    Comments  Patient states he has alot on his mind. He had found his brother passed away last month. He has alot on his mind with his health and when he had his  heart cardioverted. Numa does not want to take medications for anxiety. He does not want them to alter what he is thinking. If he is not busy he starts thinking about his health more and dwells on the past.  [name] was in the [place] ED yesterday running test.  He is also still coping with finding his brother dead and now having his own heart problems.  He is doing his best to make it through, this month has just been tough physcially and mentally.  He is still not taking his anxiety meds as he feels that he takes enough other stuff.  He continues to stay busy to keep his mind off things.  His wife is his support system and keeps an eye on him.  He was shocked with his  definbulator and even the doctor doesn't trust his ICD. He is still not sleeping well.  He was watching TV and was asleep by 9 but woke at 1130 pm and has been up since then.  He usually will take a nap in the afternoon.  We talked about keeping naps short and not napping after 3pm.  [name] is doing well mentally overall.  He has moments of sadness, but they pass.  He does not like being limited by his health and breathing. His anxiety is doing okay and he has not found that he needs the meds yet.  He went to bed at 4am today.  He has talked to doctor about it and has CPAP, but still not sleeping.  He is not compliant with his CPAP, he only tired it once.  We talked about trying melatonin for helping get to sleep. He has tried to stop napping. We talked about establishing a bed time routine to aid in sleep.  [name] is doing well mentally.  He is feeling better and really enjoys coming to class.  He is worried about our schedule shift, but still glad to be able to come some.    Expected Outcomes  Short: Attend HeartTrack stress management education to decrease stress. Long: Maintain exercise Post HeartTrack to keep stress at a minimum.  Short: [facility].  Long: Continue to cope well.  Short: Try to establish sleep routine and  use CPAP.  Long: Continue to stay positive.  Short: Continue to exercise with closure.  Long: Continue to stay positive.    Interventions  Encouraged to attend Cardiac Rehabilitation for the exercise  Encouraged to attend Cardiac Rehabilitation for the exercise;Stress management education  --  --    Continue Psychosocial Services   Follow up required by staff  Follow up required by staff  --  --      Initial Review   Source of Stress Concerns  --  Chronic Illness;Unable to perform yard/household activities;Unable to participate in former interests or hobbies  --  --       Psychosocial Discharge (Final Psychosocial Re-Evaluation): Psychosocial Re-Evaluation - 12/29/18 1135      Psychosocial Re-Evaluation   Current issues with  Current Stress Concerns;Current Anxiety/Panic;Current Sleep Concerns;Current Depression    Comments  [name] is doing well mentally.  He is feeling better and really enjoys coming to class.  He is worried about our schedule shift, but still glad to be able to come some.    Expected Outcomes  Short: Continue to exercise with closure.  Long: Continue to stay positive.       Vocational Rehabilitation: Provide vocational rehab assistance to qualifying candidates.   Vocational Rehab Evaluation & Intervention: Vocational Rehab - 08/25/18 1421      Initial Vocational Rehab Evaluation & Intervention   Assessment shows need for Vocational Rehabilitation  No       Education: Education Goals: Education classes will be provided on a variety of topics geared toward better understanding of heart health and risk factor modification. Participant will state understanding/return demonstration of topics presented as noted by education test scores.  Learning Barriers/Preferences: Learning Barriers/Preferences - 08/25/18 1421      Learning Barriers/Preferences   Learning Barriers  None    Learning Preferences  None       Education Topics:  AED/CPR: - Group verbal and  written instruction with the use of models to demonstrate the basic use of the AED with  the basic ABC's of resuscitation.   General Nutrition Guidelines/Fats and Fiber: -Group instruction provided by verbal, written material, models and posters to present the general guidelines for heart healthy nutrition. Gives an explanation and review of dietary fats and fiber.   Cardiac Rehab from 12/15/2018 in [facility] Cardiac and Pulmonary Rehab  Date  12/01/18  Educator  [facility]  Instruction Review Code  1- Verbalizes Understanding      Controlling Sodium/Reading Food Labels: -Group verbal and written material supporting the discussion of sodium use in heart healthy nutrition. Review and explanation with models, verbal and written materials for utilization of the food label.   Exercise Physiology & General Exercise Guidelines: - Group verbal and written instruction with models to review the exercise physiology of the cardiovascular system and associated critical values. Provides general exercise guidelines with specific guidelines to those with heart or lung disease.    Aerobic Exercise & Resistance Training: - Gives group verbal and written instruction on the various components of exercise. Focuses on aerobic and resistive training programs and the benefits of this training and how to safely progress through these programs..   Cardiac Rehab from 12/15/2018 in [facility] Cardiac and Pulmonary Rehab  Date  11/17/18  Educator  jh  Instruction Review Code  1- Verbalizes Understanding      Flexibility, Balance, Mind/Body Relaxation: Provides group verbal/written instruction on the benefits of flexibility and balance training, including mind/body exercise modes such as yoga, pilates and tai chi.  Demonstration and skill practice provided.   Cardiac Rehab from 12/15/2018 in [facility] Cardiac and Pulmonary Rehab  Date  12/15/18  Educator  AS  Instruction Review Code  1- Verbalizes Understanding      Stress and  Anxiety: - Provides group verbal and written instruction about the health risks of elevated stress and causes of high stress.  Discuss the correlation between heart/lung disease and anxiety and treatment options. Review healthy ways to manage with stress and anxiety.   Depression: - Provides group verbal and written instruction on the correlation between heart/lung disease and depressed mood, treatment options, and the stigmas associated with seeking treatment.   Anatomy & Physiology of the Heart: - Group verbal and written instruction and models provide basic cardiac anatomy and physiology, with the coronary electrical and arterial systems. Review of Valvular disease and Heart Failure   Cardiac Procedures: - Group verbal and written instruction to review commonly prescribed medications for heart disease. Reviews the medication, class of the drug, and side effects. Includes the steps to properly store meds and maintain the prescription regimen. (beta blockers and nitrates)   Cardiac Rehab from 12/15/2018 in [facility] Cardiac and Pulmonary Rehab  Date  11/17/18  Educator  mc  Instruction Review Code  1- Verbalizes Understanding      Cardiac Medications I: - Group verbal and written instruction to review commonly prescribed medications for heart disease. Reviews the medication, class of the drug, and side effects. Includes the steps to properly store meds and maintain the prescription regimen.   Cardiac Medications II: -Group verbal and written instruction to review commonly prescribed medications for heart disease. Reviews the medication, class of the drug, and side effects. (all other drug classes)   Cardiac Rehab from 11/03/2018 in [facility] Cardiac and Pulmonary Rehab  Date  11/03/18  Educator  [facility]  Instruction Review Code  1- Verbalizes Understanding       Go Sex-Intimacy & Heart Disease, Get SMART - Goal Setting: - Group verbal and written instruction through game  format to discuss heart  disease and the return to sexual intimacy. Provides group verbal and written material to discuss and apply goal setting through the application of the S.M.A.R.T. Method.   Cardiac Rehab from 12/15/2018 in [facility] Cardiac and Pulmonary Rehab  Date  11/17/18  Educator  mc  Instruction Review Code  1- Verbalizes Understanding      Other Matters of the Heart: - Provides group verbal, written materials and models to describe Stable Angina and Peripheral Artery. Includes description of the disease process and treatment options available to the cardiac patient.   Exercise & Equipment Safety: - Individual verbal instruction and demonstration of equipment use and safety with use of the equipment.   Cardiac Rehab from 08/29/2018 in [facility] Cardiac and Pulmonary Rehab  Date  08/29/18  Educator  [facility]  Instruction Review Code  1- Verbalizes Understanding      Infection Prevention: - Provides verbal and written material to individual with discussion of infection control including proper hand washing and proper equipment cleaning during exercise session.   Cardiac Rehab from 08/29/2018 in [facility] Cardiac and Pulmonary Rehab  Date  08/29/18  Educator  [place]  Instruction Review Code  1- Verbalizes Understanding      Falls Prevention: - Provides verbal and written material to individual with discussion of falls prevention and safety.   Cardiac Rehab from 08/29/2018 in [facility] Cardiac and Pulmonary Rehab  Date  08/29/18  Educator  [facility]  Instruction Review Code  1- Verbalizes Understanding      Diabetes: - Individual verbal and written instruction to review signs/symptoms of diabetes, desired ranges of glucose level fasting, after meals and with exercise. Acknowledge that pre and post exercise glucose checks will be done for 3 sessions at entry of program.   Know Your Numbers and Risk Factors: -Group verbal and written instruction about important numbers in your health.  Discussion of what are risk factors and how  they play a role in the disease process.  Review of Cholesterol, Blood Pressure, Diabetes, and BMI and the role they play in your overall health.   Cardiac Rehab from 11/03/2018 in [facility] Cardiac and Pulmonary Rehab  Date  11/03/18  Educator  [facility]  Instruction Review Code  1- Verbalizes Understanding      Sleep Hygiene: -Provides group verbal and written instruction about how sleep can affect your health.  Define sleep hygiene, discuss sleep cycles and impact of sleep habits. Review good sleep hygiene tips.    Other: -Provides group and verbal instruction on various topics (see comments)   Knowledge Questionnaire Score: Knowledge Questionnaire Score - 08/29/18 1243      Knowledge Questionnaire Score   Pre Score  20/26   Education Focus: Angina, Heart Failure, Nutrition, Exercise      Core Components/Risk Factors/Patient Goals at Admission: Personal Goals and Risk Factors at Admission - 08/29/18 1244      Core Components/Risk Factors/Patient Goals on Admission    Weight Management  Yes;Weight Maintenance    Intervention  Weight Management: Develop a combined nutrition and exercise program designed to reach desired caloric intake, while maintaining appropriate intake of nutrient and fiber, sodium and fats, and appropriate energy expenditure required for the weight goal.;Weight Management: Provide education and appropriate resources to help participant work on and attain dietary goals.    Admit Weight  172 lb 8 oz (78.2 kg)    Goal Weight: Short Term  172 lb (78 kg)    Goal Weight: Long Term  172 lb (  78 kg)    Expected Outcomes  Short Term: Continue to assess and modify interventions until short term weight is achieved;Long Term: Adherence to nutrition and physical activity/exercise program aimed toward attainment of established weight goal;Weight Maintenance: Understanding of the daily nutrition guidelines, which includes 25-35% calories from fat, 7% or less cal from saturated fats, less  than 286m cholesterol, less than 1.5gm of sodium, & 5 or more servings of fruits and vegetables daily    Intervention  Provide education about signs/symptoms and action to take for hypo/hyperglycemia.;Provide education about proper nutrition, including hydration, and aerobic/resistive exercise prescription along with prescribed medications to achieve blood glucose in normal ranges: Fasting glucose 65-99 mg/dL    Expected Outcomes  Short Term: Participant verbalizes understanding of the signs/symptoms and immediate care of hyper/hypoglycemia, proper foot care and importance of medication, aerobic/resistive exercise and nutrition plan for blood glucose control.;Long Term: Attainment of HbA1C < 7%.    Heart Failure  Yes    Intervention  Provide a combined exercise and nutrition program that is supplemented with education, support and counseling about heart failure. Directed toward relieving symptoms such as shortness of breath, decreased exercise tolerance, and extremity edema.    Expected Outcomes  Improve functional capacity of life;Short term: Attendance in program 2-3 days a week with increased exercise capacity. Reported lower sodium intake. Reported increased fruit and vegetable intake. Reports medication compliance.;Short term: Daily weights obtained and reported for increase. Utilizing diuretic protocols set by physician.;Long term: Adoption of self-care skills and reduction of barriers for early signs and symptoms recognition and intervention leading to self-care maintenance.    Hypertension  Yes    Intervention  Provide education on lifestyle modifcations including regular physical activity/exercise, weight management, moderate sodium restriction and increased consumption of fresh fruit, vegetables, and low fat dairy, alcohol moderation, and smoking cessation.;Monitor prescription use compliance.    Expected Outcomes  Short Term: Continued assessment and intervention until BP is < 140/959mHG in  hypertensive participants. < 130/8049mG in hypertensive participants with diabetes, heart failure or chronic kidney disease.;Long Term: Maintenance of blood pressure at goal levels.    Lipids  Yes    Intervention  Provide education and support for participant on nutrition & aerobic/resistive exercise along with prescribed medications to achieve LDL <78m54mDL >40mg38m Expected Outcomes  Short Term: Participant states understanding of desired cholesterol values and is compliant with medications prescribed. Participant is following exercise prescription and nutrition guidelines.;Long Term: Cholesterol controlled with medications as prescribed, with individualized exercise RX and with personalized nutrition plan. Value goals: LDL < 78mg,20m > 40 mg.       Core Components/Risk Factors/Patient Goals Review:  Goals and Risk Factor Review    Row Name 10/06/18 1019 11/08/18 1010 11/10/18 0951 11/24/18 0944 12/29/18 1138     Core Components/Risk Factors/Patient Goals Review   Personal Goals Review  Hypertension;Lipids;Other  Weight Management/Obesity;Improve shortness of breath with ADL's;Diabetes;Hypertension;Lipids  Weight Management/Obesity;Improve shortness of breath with ADL's;Diabetes;Hypertension;Lipids  Weight Management/Obesity;Improve shortness of breath with ADL's;Diabetes;Hypertension;Lipids  Weight Management/Obesity;Improve shortness of breath with ADL's;Diabetes;Hypertension;Lipids   Review  David Shanon Browking all meds as directed.  He is trying to eat more vegetables and less fried food.  He is walking some outside class.He does check BP at home and it stays stable.  Lem feels like he gets short of breath more now than he was after his first heart attack. His blood pressure has been good but was slightly low today. His lipids have  been good. He takes his cholesterol pill at night before bed.  [name] has been doing well in rehab.  His weight is down some overall.  He has been compliant with his  medications.  His pressures have been good in class but he does not check it at one.  We talked about getting cuff out and taking it at home.  Blood sugars are getting better.  His A1c was down to 6.2 yesterday at the [facility]!!  He is still having some SOB and has not noticed improvement yet.  [name] is doing well.  They cut his losartan in half and he feels much better.  His blood pressure is no longer dropping when he checks it at home.  A1c is 6.2 and he is no longer taking anything for it.  His SOB is still linger but it is getting better. We talked using PLB to help manage his SOB.  His weight is rock solid steady!!  [name] is doing well.  His weight is holding steady.  Blood pressures and blood sugars have done well and he continues to monitor them at home.  He is doing better with his SOB.   Expected Outcomes  Short - continue heart healthy changes Long - maintain heart healthy habits  Short: Attend HeartTrack regularly to improve shortness of breath with ADL's. Long: maintain independence with ADL's  Short: Get blood pressure cuff out and start checking at home.  Long: Continue to work on weight loss.  Short: Use PLB to manage breathing.  Long: Continue to manage risk factors.  Short: Continue to work on breathing.  Long: Continue to monitor risk factors.      Core Components/Risk Factors/Patient Goals at Discharge (Final Review):  Goals and Risk Factor Review - 12/29/18 1138      Core Components/Risk Factors/Patient Goals Review   Personal Goals Review  Weight Management/Obesity;Improve shortness of breath with ADL's;Diabetes;Hypertension;Lipids    Review  [name] is doing well.  His weight is holding steady.  Blood pressures and blood sugars have done well and he continues to monitor them at home.  He is doing better with his SOB.    Expected Outcomes  Short: Continue to work on breathing.  Long: Continue to monitor risk factors.       ITP Comments: ITP Comments    Row Name 08/25/18 1430 08/29/18  1215 09/13/18 1107 09/21/18 0600 10/19/18 1333   ITP Comments  nitial Orientation Virtual Call completed.   EP/RD appt is on 8/17 Diagnosis documentation can be found in MEDIA tab [facility] records  Completed 6MWT, gym orientation, and RD evaluation. Initial ITP created and sent for review to Dr. Emily Filbert, Medical Director.  Returning call to pt. He called to leave [place] a message that he has cough and congestion.  They had a COVID test done and it was negative.  He is taking some medicine now to feel better.  Encouraged him to stay home until he is symptom free for 48 hours.  30 Day review. Continue with ITP unless directed changes per Medical Director review.  Still out recuperating from cough/congestion  30 day review completed. ITP sent to Dr. Emily Filbert, Medical Director of Cardiac and Pulmonary Rehab. Continue with ITP unless changes are made by physician.  Department closed starting 10/2 until further notice by infection prevention and Health at Work teams for [place].   [place] Name 10/25/18 1044 11/01/18 1117 11/10/18 0945 11/16/18 0623 12/14/18 0937   ITP Comments  Called to  check on pt.  He was admitted to hospital for vtach with two shocks.   He has a follow up on Friday with his cardiologist.  He is feeling better. He hopes to return next week.  [name] returned to program after being out several weeks for [employer]. His ICD was readjusted to HR of 180.  HIs doctors at the [place] told him to resume his rehab  [name] was in [place] ED yesterday per their order running test.  They released him home.  30 day review completed. Continue with ITP sent to Dr. Emily Filbert, Medical Director of Cardiac and Pulmonary Rehab for review , changes as needed and signature.  30 day review competed . ITP sent to Dr Emily Filbert for review, changes as needed and ITP approval signature.   [place] Name 01/11/19 [phone]           ITP Comments  30 day review competed . ITP sent to Dr Emily Filbert for review, changes as needed and ITP approval  signature          Comments:

## 2019-01-12 ENCOUNTER — Telehealth: Payer: Self-pay

## 2019-01-12 NOTE — Telephone Encounter (Signed)
Called pt to f/u as no rehab is scheduled this week. Pt reports in ICU at [facility] due to V-tach 2x/day last week - pt reports needing operation at either [facility] or [place] Monday. Will f/u with pt to see status next week.

## 2019-01-19 ENCOUNTER — Ambulatory Visit: Payer: Medicare Other

## 2019-01-23 ENCOUNTER — Telehealth: Payer: Self-pay

## 2019-01-23 NOTE — Telephone Encounter (Signed)
Peter Frey had an ablation last Monday 01/16/19.  He has a video visit tomorrow - he plans to return on the 21st.

## 2019-01-26 ENCOUNTER — Other Ambulatory Visit: Payer: Self-pay

## 2019-01-26 ENCOUNTER — Encounter: Payer: No Typology Code available for payment source | Attending: Internal Medicine | Admitting: *Deleted

## 2019-01-26 DIAGNOSIS — I255 Ischemic cardiomyopathy: Secondary | ICD-10-CM | POA: Insufficient documentation

## 2019-01-26 DIAGNOSIS — I429 Cardiomyopathy, unspecified: Secondary | ICD-10-CM

## 2019-01-26 NOTE — Progress Notes (Signed)
Virtual call completed today. Calls are done every week that patient is not attending an onsite exercise session during the COVID 19 PHE Crisis.   Today's call included review of :a psychosocial review and his exercise routine at home. Peter Frey has been relaxing his exercise routine this week after an ablation that was completed last week.  He stated he is still nervous to do too much since the ablation: not knowing if ICD will go off and shock him. He has an appointment today with a clinic and will address a note to return to exercise with his physician.   Follow up call is scheduled for 02/09/2019 930AM

## 2019-02-02 ENCOUNTER — Ambulatory Visit: Payer: Medicare Other

## 2019-02-08 ENCOUNTER — Encounter: Payer: Self-pay | Admitting: *Deleted

## 2019-02-08 DIAGNOSIS — I429 Cardiomyopathy, unspecified: Secondary | ICD-10-CM

## 2019-02-08 NOTE — Progress Notes (Signed)
Cardiac Individual Treatment Plan  Patient Details  Name: Peter Frey MRN: [id number] Date of Birth: 12/10/1950 Referring Provider:     Cardiac Rehab from 08/29/2018 in [facility]  Referring Provider  Patel, Sonal MD      Initial Encounter Date:    Cardiac Rehab from 08/29/2018 in [facility]  Date  08/29/18      Visit Diagnosis: Cardiomyopathy, unspecified type (HCC)  Patient's Home Medications on Admission:  Current Outpatient Medications:  .  albuterol (VENTOLIN HFA) 108 (90 Base) MCG/ACT inhaler, Inhale into the lungs., Disp: , Rfl:  .  aspirin 81 MG tablet, Take 81 mg by mouth daily., Disp: , Rfl:  .  furosemide (LASIX) 40 MG tablet, Take 40 mg by mouth daily. , Disp: , Rfl:  .  gabapentin (NEURONTIN) 300 MG capsule, Take 300 mg by mouth 3 (three) times daily., Disp: , Rfl:  .  losartan (COZAAR) 50 MG tablet, Take 50 mg by mouth daily. , Disp: , Rfl:  .  metoprolol (LOPRESSOR) 50 MG tablet, Take 50 mg by mouth 2 (two) times daily., Disp: , Rfl:  .  sildenafil (VIAGRA) 50 MG tablet, Take by mouth., Disp: , Rfl:  .  simvastatin (ZOCOR) 20 MG tablet, Take 20 mg by mouth daily., Disp: , Rfl:  .  spironolactone (ALDACTONE) 25 MG tablet, Take 25 mg by mouth daily. , Disp: , Rfl:  .  TIOTROPIUM BROMIDE-OLODATEROL IN, Inhale 2 Inhalers into the lungs daily., Disp: , Rfl:   Past Medical History: Past Medical History:  Diagnosis Date  . CHF (congestive heart failure) (HCC)   . COPD (chronic obstructive pulmonary disease) (HCC)   . Coronary artery disease   . MI (myocardial infarction) (HCC)     Tobacco Use: Social History   Tobacco Use  Smoking Status Former Smoker  . Packs/day: 2.00  . Years: 43.00  . Pack years: 86.00  . Types: Cigarettes  . Quit date: 2015  . Years since quitting: 6.0  Smokeless Tobacco Never Used    Labs: Recent Review Flowsheet Data    Labs for ITP Cardiac and Pulmonary Rehab Latest Ref Rng & Units  10/18/2018   Cholestrol 0 - 200 mg/dL 94   LDLCALC 0 - 99 mg/dL 29   HDL >40 mg/dL 50   Trlycerides <150 mg/dL 73       Exercise Target Goals: Exercise Program Goal: Individual exercise prescription set using results from initial 6 min walk test and THRR while considering  patient's activity barriers and safety.   Exercise Prescription Goal: Initial exercise prescription builds to 30-45 minutes a day of aerobic activity, 2-3 days per week.  Home exercise guidelines will be given to patient during program as part of exercise prescription that the participant will acknowledge.  Activity Barriers & Risk Stratification: Activity Barriers & Cardiac Risk Stratification - 08/29/18 1216      Activity Barriers & Cardiac Risk Stratification   Activity Barriers  Shortness of Breath;Deconditioning;Muscular Weakness    Cardiac Risk Stratification  High       6 Minute Walk: 6 Minute Walk    Row Name 08/29/18 1216         6 Minute Walk   Phase  Initial     Distance  1270 feet     Walk Time  6 minutes     # of Rest Breaks  0     MPH  2.41     METS  3.35       RPE  12     Perceived Dyspnea   2     VO2 Peak  11.72     Symptoms  Yes (comment)     Comments  Calf pain 9/10, SOB     Resting HR  71 bpm     Resting BP  124/62     Resting Oxygen Saturation   99 %     Exercise Oxygen Saturation  during 6 min walk  95 %     Max Ex. HR  95 bpm     Max Ex. BP  128/64     2 Minute Post BP  108/64        Oxygen Initial Assessment:   Oxygen Re-Evaluation:   Oxygen Discharge (Final Oxygen Re-Evaluation):   Initial Exercise Prescription: Initial Exercise Prescription - 08/29/18 1200      Date of Initial Exercise RX and Referring Provider   Date  08/29/18    Referring Provider  Nicholes Mango MD      Treadmill   MPH  2.3    Grade  0.5    Minutes  15    METs  2.92      Recumbant Bike   Level  2    RPM  50    Watts  33    Minutes  15    METs  2.5      NuStep   Level  2     SPM  80    Minutes  15    METs  2.5      Recumbant Elliptical   Level  1    RPM  50    Minutes  15    METs  2.5      Prescription Details   Frequency (times per week)  2    Duration  Progress to 30 minutes of continuous aerobic without signs/symptoms of physical distress      Intensity   THRR 40-80% of Max Heartrate  104-137    Ratings of Perceived Exertion  11-13    Perceived Dyspnea  0-4      Progression   Progression  Continue to progress workloads to maintain intensity without signs/symptoms of physical distress.      Resistance Training   Training Prescription  Yes    Weight  4 lbs    Reps  10-15       Perform Capillary Blood Glucose checks as needed.  Exercise Prescription Changes: Exercise Prescription Changes    Row Name 08/29/18 1200 09/02/18 1000 09/07/18 1100 10/06/18 1000 10/25/18 1000     Response to Exercise   Blood Pressure (Admit)  124/62  106/66  100/58  -  122/64   Blood Pressure (Exercise)  128/64  130/64  132/80  -  124/64   Blood Pressure (Exit)  108/64  110/70  132/70  -  108/56   Heart Rate (Admit)  71 bpm  80 bpm  69 bpm  -  70 bpm   Heart Rate (Exercise)  95 bpm  101 bpm  104 bpm  -  95 bpm   Heart Rate (Exit)  71 bpm  70 bpm  66 bpm  -  76 bpm   Oxygen Saturation (Admit)  99 %  -  -  -  -   Oxygen Saturation (Exercise)  95 %  -  -  -  -   Rating of Perceived Exertion (Exercise)  _0 -  12  Perceived Dyspnea (Exercise)  2  -  -  -  -   Symptoms  calves buring 9/10, SOB  -  -  -  none   Comments  walk test results  -  -  -  -   Duration  -  Progress to 30 minutes of  aerobic without signs/symptoms of physical distress  Continue with 30 min of aerobic exercise without signs/symptoms of physical distress.  -  Continue with 30 min of aerobic exercise without signs/symptoms of physical distress.   Intensity  -  THRR unchanged  THRR unchanged  -  THRR unchanged     Progression   Progression  -  Continue to progress workloads to maintain  intensity without signs/symptoms of physical distress.  Continue to progress workloads to maintain intensity without signs/symptoms of physical distress.  -  Continue to progress workloads to maintain intensity without signs/symptoms of physical distress.   Average METs  -  2.4  2.9  -  2.68     Resistance Training   Training Prescription  -  Yes  Yes  -  Yes   Weight  -  4 lbs  4 lb  -  4 lbs   Reps  -  10-15  10-15  -  10-15     Interval Training   Interval Training  -  -  No  -  No     Treadmill   MPH  -  2  2  -  2.5   Grade  -  0  0  -  0   Minutes  -  15  15  -  15   METs  -  2.5  2.5  -  2.91     NuStep   Level  -  2  5  -  5   SPM  -  80  80  -  -   Minutes  -  15  15  -  15   METs  -  2.9  3.3  -  2.7     Recumbant Elliptical   Level  -  1  -  -  2.5   RPM  -  50  -  -  -   Minutes  -  15  -  -  15   METs  -  1.6  -  -  2.2     Home Exercise Plan   Plans to continue exercise at  -  -  -  Home (comment) considering gym  Home (comment) considering gym   Frequency  -  -  -  Add 2 additional days to program exercise sessions.  Add 2 additional days to program exercise sessions.   Initial Home Exercises Provided  -  -  -  10/06/18  10/06/18   Row Name 11/03/18 1600 11/16/18 1300 12/02/18 1200 12/15/18 1000 12/30/18 1300     Response to Exercise   Blood Pressure (Admit)  110/70  122/56  110/64  112/64  124/64   Blood Pressure (Exercise)  138/62  120/70  128/76  120/60  126/66   Blood Pressure (Exit)  100/62  116/66  102/60  100/60  124/60   Heart Rate (Admit)  68 bpm  76 bpm  82 bpm  70 bpm  58 bpm   Heart Rate (Exercise)  106 bpm  108 bpm  104 bpm  107 bpm  84 bpm   Heart Rate (Exit)  67 bpm    80 bpm  68 bpm  73 bpm  66 bpm   Rating of Perceived Exertion (Exercise)  13  15  13  14  13   Symptoms  none  none  none  none  none   Duration  Continue with 30 min of aerobic exercise without signs/symptoms of physical distress.  Continue with 30 min of aerobic exercise without  signs/symptoms of physical distress.  Continue with 30 min of aerobic exercise without signs/symptoms of physical distress.  Continue with 30 min of aerobic exercise without signs/symptoms of physical distress.  Continue with 30 min of aerobic exercise without signs/symptoms of physical distress.   Intensity  THRR unchanged  THRR unchanged  THRR unchanged  THRR unchanged  THRR unchanged     Progression   Progression  Continue to progress workloads to maintain intensity without signs/symptoms of physical distress.  Continue to progress workloads to maintain intensity without signs/symptoms of physical distress.  Continue to progress workloads to maintain intensity without signs/symptoms of physical distress.  Continue to progress workloads to maintain intensity without signs/symptoms of physical distress.  Continue to progress workloads to maintain intensity without signs/symptoms of physical distress.   Average METs  2.5  2.91  2.6  2.77  2.5     Resistance Training   Training Prescription  Yes  Yes  Yes  Yes  Yes   Weight  4 lb  4 lbs  5 lb  5 lb  5 lb   Reps  10-15  10-15  10-15  10-15  10-15     Interval Training   Interval Training  No  No  No  No  No     Treadmill   MPH  2  2  2  -  -   Grade  0  0  0  -  -   Minutes  15  15  15  -  -   METs  2.53  2.53  2.53  -  -     Recumbant Bike   Level  -  3  -  3  -   Minutes  -  15  -  15  -   METs  -  3.2  -  3.3  -     NuStep   Level  4  4  4  4  5   Minutes  15  15  15  15  15   METs  2.3 T5 - alternate station  3  2.8  2.8  3     Recumbant Elliptical   Level  -  -  4  4  2   Minutes  -  -  15  15  15   METs  -  -  2.2  2.2  2     Home Exercise Plan   Plans to continue exercise at  Home (comment) considering gym  Home (comment) considering gym  Home (comment) considering gym  Home (comment) considering gym  Home (comment) considering gym   Frequency  Add 2 additional days to program exercise sessions.  Add 2 additional days to  program exercise sessions.  Add 2 additional days to program exercise sessions.  Add 2 additional days to program exercise sessions.  Add 2 additional days to program exercise sessions.   Initial Home Exercises Provided  10/06/18  10/06/18  10/06/18  10/06/18  10/06/18   Row Name 01/12/19 1000               Response to Exercise   Blood Pressure (Admit)  126/64       Blood Pressure (Exercise)  128/64       Blood Pressure (Exit)  116/64       Heart Rate (Admit)  73 bpm       Heart Rate (Exercise)  99 bpm       Heart Rate (Exit)  67 bpm       Rating of Perceived Exertion (Exercise)  15       Symptoms  none       Duration  Continue with 30 min of aerobic exercise without signs/symptoms of physical distress.       Intensity  THRR unchanged         Progression   Progression  Continue to progress workloads to maintain intensity without signs/symptoms of physical distress.       Average METs  2.6         Resistance Training   Training Prescription  Yes       Weight  5 lb       Reps  10-15         Interval Training   Interval Training  No         Recumbant Bike   Level  3       RPM  60       Minutes  15       METs  2.9         NuStep   Level  5       Minutes  15       METs  2.3         Home Exercise Plan   Plans to continue exercise at  Home (comment) considering gym       Frequency  Add 2 additional days to program exercise sessions.       Initial Home Exercises Provided  10/06/18          Exercise Comments: Exercise Comments    Row Name 08/30/18 1012 09/23/18 1301 01/26/19 1123       Exercise Comments  First full day of exercise!  Patient was oriented to gym and equipment including functions, settings, policies, and procedures.  Patient's individual exercise prescription and treatment plan were reviewed.  All starting workloads were established based on the results of the 6 minute walk test done at initial orientation visit.  The plan for exercise progression was also  introduced and progression will be customized based on patient's performance and goals.  [name] has been out since last review due to illness  Virtual call completed today. Calls are done every week that patient is not attending an onsite exercise session during the COVID 19 PHE Crisis.   Today's call included review of :a psychosocial review and his exercise routine at home. [name] has been relaxing his exercise routine this week after an ablation that was completed last week.  He stated he is still nervous to do too much since the ablation: not knowing if ICD will go off and shock him. He has an appointment today with a clinic and will address a note to return to exercise with his physician.   Follow up call is scheduled for 02/09/2019 930AM        Exercise Goals and Review: Exercise Goals    Row Name 08/29/18 1241             Exercise Goals   Increase Physical Activity  Yes  Intervention  Provide advice, education, support and counseling about physical activity/exercise needs.;Develop an individualized exercise prescription for aerobic and resistive training based on initial evaluation findings, risk stratification, comorbidities and participant's personal goals.       Expected Outcomes  Short Term: Attend rehab on a regular basis to increase amount of physical activity.;Long Term: Add in home exercise to make exercise part of routine and to increase amount of physical activity.;Long Term: Exercising regularly at least 3-5 days a week.       Increase Strength and Stamina  Yes       Intervention  Provide advice, education, support and counseling about physical activity/exercise needs.;Develop an individualized exercise prescription for aerobic and resistive training based on initial evaluation findings, risk stratification, comorbidities and participant's personal goals.       Expected Outcomes  Short Term: Increase workloads from initial exercise prescription for resistance, speed, and  METs.;Short Term: Perform resistance training exercises routinely during rehab and add in resistance training at home;Long Term: Improve cardiorespiratory fitness, muscular endurance and strength as measured by increased METs and functional capacity (6MWT)       Able to understand and use rate of perceived exertion (RPE) scale  Yes       Intervention  Provide education and explanation on how to use RPE scale       Expected Outcomes  Short Term: Able to use RPE daily in rehab to express subjective intensity level;Long Term:  Able to use RPE to guide intensity level when exercising independently       Able to understand and use Dyspnea scale  Yes       Intervention  Provide education and explanation on how to use Dyspnea scale       Expected Outcomes  Short Term: Able to use Dyspnea scale daily in rehab to express subjective sense of shortness of breath during exertion;Long Term: Able to use Dyspnea scale to guide intensity level when exercising independently       Knowledge and understanding of Target Heart Rate Range (THRR)  Yes       Intervention  Provide education and explanation of THRR including how the numbers were predicted and where they are located for reference       Expected Outcomes  Short Term: Able to state/look up THRR;Short Term: Able to use daily as guideline for intensity in rehab;Long Term: Able to use THRR to govern intensity when exercising independently       Able to check pulse independently  Yes       Intervention  Provide education and demonstration on how to check pulse in carotid and radial arteries.;Review the importance of being able to check your own pulse for safety during independent exercise       Expected Outcomes  Short Term: Able to explain why pulse checking is important during independent exercise;Long Term: Able to check pulse independently and accurately       Understanding of Exercise Prescription  Yes       Intervention  Provide education, explanation, and  written materials on patient's individual exercise prescription       Expected Outcomes  Short Term: Able to explain program exercise prescription;Long Term: Able to explain home exercise prescription to exercise independently          Exercise Goals Re-Evaluation : Exercise Goals Re-Evaluation    [place] Name 08/30/18 1012 09/02/18 1024 09/07/18 1111 10/25/18 1044 11/03/18 1614     Exercise Goal Re-Evaluation   Exercise Goals  Review  Increase Physical Activity;Increase Strength and Stamina;Able to understand and use rate of perceived exertion (RPE) scale;Able to understand and use Dyspnea scale;Knowledge and understanding of Target Heart Rate Range (THRR);Able to check pulse independently;Understanding of Exercise Prescription  Increase Physical Activity;Increase Strength and Stamina;Able to understand and use rate of perceived exertion (RPE) scale;Knowledge and understanding of Target Heart Rate Range (THRR);Able to check pulse independently;Understanding of Exercise Prescription  Increase Physical Activity;Increase Strength and Stamina;Able to understand and use rate of perceived exertion (RPE) scale;Knowledge and understanding of Target Heart Rate Range (THRR);Able to check pulse independently;Understanding of Exercise Prescription  -  Increase Physical Activity;Increase Strength and Stamina;Able to understand and use rate of perceived exertion (RPE) scale;Knowledge and understanding of Target Heart Rate Range (THRR);Able to check pulse independently;Understanding of Exercise Prescription   Comments  Reviewed RPE scale, THR and program prescription with pt today.  Pt voiced understanding and was given a copy of goals to take home.  David has done well in his first week of HT.  He has a very positive attitude and seems to enjoy exercise.  The TM has been most challenging  David has increased level on NS.  Staff will continue to encourage progress.  David has been out since 9/24.  This is Davids first week  back in almost a month.  He is building  back to previous levels.   Expected Outcomes  Short: Use RPE daily to regulate intensity. Long: Follow program prescription in THR.  Short - attend HT consistently Long - improve overall MET level  Short - increase TM level Long - improve overall MET level  -  Short - get back on track consistently Long - increase MET level   Row Name 11/10/18 0955 11/16/18 1336 11/24/18 0941 12/02/18 1223 12/15/18 1020     Exercise Goal Re-Evaluation   Exercise Goals Review  Increase Physical Activity;Increase Strength and Stamina;Understanding of Exercise Prescription  Increase Physical Activity;Increase Strength and Stamina;Understanding of Exercise Prescription  Increase Physical Activity;Increase Strength and Stamina;Understanding of Exercise Prescription  Increase Physical Activity;Increase Strength and Stamina;Able to understand and use rate of perceived exertion (RPE) scale;Able to understand and use Dyspnea scale;Knowledge and understanding of Target Heart Rate Range (THRR);Able to check pulse independently;Understanding of Exercise Prescription  Increase Physical Activity;Increase Strength and Stamina;Understanding of Exercise Prescription   Comments  David has been doing well in rehab.  He has not done much at home this month with everything that is going on.  He is still having some SOB.  He has not noticed an improvement with strength and stamina yet.  He is able to do what he wants but modifies it some.  David continues to do well in rehab.  He has been having some issues with his blood pressures at home.  He is on level 4 on the NuStep.  We will continue to monitor his progress.  David is doing well in rehab.  We are trying increase his mobility on the treadmill.  We talked about doing the SET program after graduation.  He has not been doing his home exercise.  He is going to try to get back to it.  He usually walks at home but since his medical scare he backed off.  He  is feeling better now.  David continues to do well.  He attends consistently and works at proper RPE when he is here.  David continues to do well in rehab.  He is on level 4 for NuStep and getting   3.3 METs for bike.  We will continue to monitor his progress.   Expected Outcomes  Short: Get back to exercising on off days.  Long: Continue to improve stamina.  Short: Add incline on treadmill  Long: Continue increase activity levels.  Short: Get back to walking at home.  Long[facility]  Short - add walking outside HT sessions Long : maintain exercise  Short: Talk about adding in intervals. Long: Continue to improve stamina.   [place] Name 12/29/18 1132 12/30/18 1336 01/12/19 1045         Exercise Goal Re-Evaluation   Exercise Goals Review  Increase Physical Activity;Increase Strength and Stamina;Understanding of Exercise Prescription  Increase Physical Activity;Increase Strength and Stamina;Able to understand and use rate of perceived exertion (RPE) scale;Knowledge and understanding of Target Heart Rate Range (THRR);Able to check pulse independently;Understanding of Exercise Prescription  Increase Physical Activity;Increase Strength and Stamina;Able to understand and use rate of perceived exertion (RPE) scale;Able to understand and use Dyspnea scale;Knowledge and understanding of Target Heart Rate Range (THRR);Able to check pulse independently;Understanding of Exercise Prescription     Comments  [name] continues to do well in rehab.  He has been walking some at home.  He is building back up.  He is feeling stronger and has more stamina.  [name] has tried the TM and elliptical.  He had some claudication pain on the TM and elliptical was challenging for him.  [name] continues to do well in rehab.  Staff will monitor progress.     Expected Outcomes  Short: Continue to build exercise back up at home. Long: Continue to improve stamina.  Short - continue to try TM and elliptical  Long : build  overall stamina  Short - try Tm and elliptical nest sessions Long - continue to build stamina        Discharge Exercise Prescription (Final Exercise Prescription Changes): Exercise Prescription Changes - 01/12/19 1000      Response to Exercise   Blood Pressure (Admit)  126/64    Blood Pressure (Exercise)  128/64    Blood Pressure (Exit)  116/64    Heart Rate (Admit)  73 bpm    Heart Rate (Exercise)  99 bpm    Heart Rate (Exit)  67 bpm    Rating of Perceived Exertion (Exercise)  15    Symptoms  none    Duration  Continue with 30 min of aerobic exercise without signs/symptoms of physical distress.    Intensity  THRR unchanged      Progression   Progression  Continue to progress workloads to maintain intensity without signs/symptoms of physical distress.    Average METs  2.6      Resistance Training   Training Prescription  Yes    Weight  5 lb    Reps  10-15      Interval Training   Interval Training  No      Recumbant Bike   Level  3    RPM  60    Minutes  15    METs  2.9      NuStep   Level  5    Minutes  15    METs  2.3      Home Exercise Plan   Plans to continue exercise at  Home (comment)   considering gym   Frequency  Add 2 additional days to program exercise sessions.    Initial Home Exercises Provided  10/06/18  Nutrition:  Target Goals: Understanding of nutrition guidelines, daily intake of sodium <1500mg, cholesterol <200mg, calories 30% from fat and 7% or less from saturated fats, daily to have 5 or more servings of fruits and vegetables.  Biometrics: Pre Biometrics - 08/29/18 1242      Pre Biometrics   Height  6' (1.829 m)    Weight  172 lb 8 oz (78.2 kg)    BMI (Calculated)  23.39    Single Leg Stand  30 seconds        Nutrition Therapy Plan and Nutrition Goals: Nutrition Therapy & Goals - 08/29/18 1200      Nutrition Therapy   Diet  low Na, HH diet    Protein (specify units)  62g    Fiber  30 grams    Whole Grain Foods  3  servings    Saturated Fats  12 max. grams    Fruits and Vegetables  5 servings/day    Sodium  1.5 grams      Personal Nutrition Goals   Nutrition Goal  ST: when pt does not have breakfast, have mid-morning protein snack LT: gain energy,  reduce SOB    Comments  Pt reports eating soul food mostly. He reports loving vegetables, disliking bread, eating beans and chicken (mostly baked, but with skin- will sometimes have fried chicken), likes to use butter and thosand island dressing - will have salads a couple times a week, will have fish 1x/week, 1 egg 2-3x/week, B (eggs, cheese and sausage) a couple times a week. Pt will normall have just dinner around 3pm (if pt has snack too close to dinner will not eat) - discussed a protein rich mid-morning snack (discussed healthy options). Pt reports that he drinks mountain dew mostly during the day. Discussed HH eating.      Intervention Plan   Intervention  Prescribe, educate and counsel regarding individualized specific dietary modifications aiming towards targeted core components such as weight, hypertension, lipid management, diabetes, heart failure and other comorbidities.;Nutrition handout(s) given to patient.    Expected Outcomes  Short Term Goal: Understand basic principles of dietary content, such as calories, fat, sodium, cholesterol and nutrients.;Short Term Goal: A plan has been developed with personal nutrition goals set during dietitian appointment.;Long Term Goal: Adherence to prescribed nutrition plan.       Nutrition Assessments: Nutrition Assessments - 08/29/18 1200      MEDFICTS Scores   Pre Score  43       Nutrition Goals Re-Evaluation: Nutrition Goals Re-Evaluation    Row Name 10/04/18 1016 11/08/18 1015 11/15/18 1138 12/06/18 1012 12/29/18 1007     Goals   Current Weight  -  169 lb (76.7 kg)  -  -  -   Nutrition Goal  ST: when pt does not have breakfast, have mid-morning protein snack LT: gain energy,  reduce SOB  Maintain  weight. Drink more water.  Maintain weight. Drink more water.  ST: continue with curent diet LT: get back to baseline  ST: continue with curent diet LT: get back to baseline   Comment  Pt reports eating the same way since the last time we spoke, discussed the importance of getting enough protein and mechanical eating when he is not hungry.  Patient states that he does not drink much water. He has at least 3 regular Mountain Dews a day. He does not check his blood sugar at home. He is eating more baked goods and intaking more vegetables. Spokes to patient about   his low blood pressure and informed him that he may need to drink more water.  Continue with current changes  Pt reports drinking more water, currently no sodas in the house. Pt reports no new changes in diet and would like to continue with current diet.  Pt reports no new changes in diet and would like to continue with current diet.   Expected Outcome  ST: when pt does not have breakfast, have mid-morning protein snack LT: gain energy,  reduce SOB  Short:cut back on mountain dew and drink water. Long: maintain drinking water and drink mountain dew seldome.  Short:cut back on mountain dew and drink water. Long: maintain drinking water and drink mountain dew seldome.  ST: continue with curent diet LT: get back to baseline  ST: continue with curent diet LT: get back to baseline      Nutrition Goals Discharge (Final Nutrition Goals Re-Evaluation): Nutrition Goals Re-Evaluation - 12/29/18 1007      Goals   Nutrition Goal  ST: continue with curent diet LT: get back to baseline    Comment  Pt reports no new changes in diet and would like to continue with current diet.    Expected Outcome  ST: continue with curent diet LT: get back to baseline       Psychosocial: Target Goals: Acknowledge presence or absence of significant depression and/or stress, maximize coping skills, provide positive support system. Participant is able to verbalize types and  ability to use techniques and skills needed for reducing stress and depression.   Initial Review & Psychosocial Screening: Initial Psych Review & Screening - 08/25/18 1418      Initial Review   Current issues with  None Identified      Family Dynamics   Good Support System?  Yes   Wife and family     Barriers   Psychosocial barriers to participate in program  There are no identifiable barriers or psychosocial needs.;The patient should benefit from training in stress management and relaxation.      Screening Interventions   Interventions  Encouraged to exercise;To provide support and resources with identified psychosocial needs;Provide feedback about the scores to participant    Expected Outcomes  Short Term goal: Utilizing psychosocial counselor, staff and physician to assist with identification of specific Stressors or current issues interfering with healing process. Setting desired goal for each stressor or current issue identified.;Long Term Goal: Stressors or current issues are controlled or eliminated.;Short Term goal: Identification and review with participant of any Quality of Life or Depression concerns found by scoring the questionnaire.;Long Term goal: The participant improves quality of Life and PHQ9 Scores as seen by post scores and/or verbalization of changes       Quality of Life Scores:  Quality of Life - 08/29/18 1242      Quality of Life   Select  Quality of Life      Quality of Life Scores   Health/Function Pre  15.07 %    Socioeconomic Pre  20.36 %    Psych/Spiritual Pre  23.57 %    Family Pre  28.8 %    GLOBAL Pre  20.08 %      Scores of 19 and below usually indicate a poorer quality of life in these areas.  A difference of  2-3 points is a clinically meaningful difference.  A difference of 2-3 points in the total score of the Quality of Life Index has been associated with significant improvement in overall quality of life,  self-image, physical symptoms, and  general health in studies assessing change in quality of life.  PHQ-9: Recent Review Flowsheet Data    Depression screen PHQ 2/9 08/29/2018   Decreased Interest 0   Down, Depressed, Hopeless 0   PHQ - 2 Score 0   Altered sleeping 0   Tired, decreased energy 3   Change in appetite 0   Feeling bad or failure about yourself  0   Trouble concentrating 0   Moving slowly or fidgety/restless 0   Suicidal thoughts 0   PHQ-9 Score 3   Difficult doing work/chores Not difficult at all     Interpretation of Total Score  Total Score Depression Severity:  1-4 = Minimal depression, 5-9 = Mild depression, 10-14 = Moderate depression, 15-19 = Moderately severe depression, 20-27 = Severe depression   Psychosocial Evaluation and Intervention: Psychosocial Evaluation - 08/25/18 1426      Psychosocial Evaluation & Interventions   Interventions  Encouraged to exercise with the program and follow exercise prescription    Comments  Shjon has no barriers to entering the program. He is eager to start the program so he can build up his stamina.  He stated that he has seen a psychiatrist this year  and reports he does not feel depressed. He states he is doing fine dealing with the fact he has heart disease and the severity of his disease.    Expected Outcomes  Zaydrian will continue to deal well with his heart disease.  He will be able to benefit from the program to help him feel stronger and more able to meet his daily routines.    Continue Psychosocial Services   Follow up required by staff       Psychosocial Re-Evaluation: Psychosocial Re-Evaluation    Row Name 11/08/18 1005 11/10/18 0946 11/24/18 0947 12/29/18 1135 01/26/19 1123     Psychosocial Re-Evaluation   Current issues with  Current Sleep Concerns;Current Anxiety/Panic  Current Stress Concerns;Current Anxiety/Panic;Current Sleep Concerns  Current Stress Concerns;Current Anxiety/Panic;Current Sleep Concerns;Current Depression  Current Stress  Concerns;Current Anxiety/Panic;Current Sleep Concerns;Current Depression  Current Stress Concerns   Comments  Patient states he has alot on his mind. He had found his brother passed away last month. He has alot on his mind with his health and when he had his heart cardioverted. Jonathin does not want to take medications for anxiety. He does not want them to alter what he is thinking. If he is not busy he starts thinking about his health more and dwells on the past.  [facility]  He is also still coping with finding his brother dead and now having his own heart problems.  He is doing his best to make it through, this month has just been tough physcially and mentally.  He is still not taking his anxiety meds as he feels that he takes enough other stuff.  He continues to stay busy to keep his mind off things.  His wife is his support system and keeps an eye on him.  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 He is still not sleeping well.  He was watching TV and was asleep by 9 but woke at 1130 pm and has been up since then.  He usually will take a nap in the afternoon.  We talked about keeping naps short and not napping after 3pm.  David is doing well mentally overall.  He has moments   of sadness, but they pass.  He does not like being limited by his health and breathing. His anxiety is doing okay and he has not found that he needs the meds yet.  He went to bed at 4am today.  He has talked to doctor about it and has CPAP, but still not sleeping.  He is not compliant with his CPAP, he only tired it once.  We talked about trying melatonin for helping get to sleep. He has tried to stop napping. We talked about establishing a bed time routine to aid in sleep.  [name] is doing well mentally.  He is feeling better and really enjoys coming to class.  He is worried about our schedule shift, but still glad to be able to come some.  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  He states he is doing fine, but is nervous that his defibrillator will go off if the rhythm occurs again.   Expected Outcomes  Short: Attend HeartTrack stress management education to decrease stress. Long: Maintain exercise Post HeartTrack to keep stress at a minimum.  Short: [facility].  Long: Continue to cope well.  Short: Try to establish sleep routine and use CPAP.  Long: Continue to stay positive.  Short: Continue to exercise with closure.  Long: Continue to stay positive.  STG: [name] is able to work on relaxing his thoughts about the ICD firing. Talking with staff and his physician about his concerns. Practicing deep breathing and relaxation.   LTG: [name] is able to live daily without being as nervous about  the expectation of the ICD firing   Interventions  Encouraged to attend Cardiac Rehabilitation for the exercise  Encouraged to attend Cardiac Rehabilitation for the exercise;Stress management education  -  -  Encouraged to attend Cardiac Rehabilitation for the exercise;Relaxation education   Continue Psychosocial Services   Follow up required by staff  Follow up required by staff  -  -  Follow up required by staff     Initial Review   Source of Stress Concerns  -  Chronic Illness;Unable to perform yard/household activities;Unable to participate in former interests or hobbies  -  -  -      Psychosocial Discharge (Final Psychosocial Re-Evaluation): Psychosocial Re-Evaluation - 01/26/19 1123      Psychosocial Re-Evaluation   Current issues with  Current Stress Concerns    Comments  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  He states he is doing fine, but is nervous that his defibrillator will go off if the rhythm occurs again.    Expected Outcomes  STG: [name] is able to work on relaxing his thoughts about the ICD firing. Talking with staff and his physician about his  concerns. Practicing deep breathing and relaxation.   LTG: [name] is able to live daily without being as nervous about  the expectation of the ICD firing    Interventions  Encouraged to attend Cardiac Rehabilitation for the exercise;Relaxation education    Continue Psychosocial Services   Follow up required by staff       Vocational Rehabilitation: Provide vocational rehab assistance to qualifying candidates.   Vocational Rehab Evaluation & Intervention: Vocational Rehab - 08/25/18 1421      Initial Vocational Rehab Evaluation & Intervention   Assessment shows need for Vocational Rehabilitation  No       Education: Education Goals: Education  classes will be provided on a variety of topics geared toward better understanding of heart health and risk factor modification. Participant will state understanding/return demonstration of topics presented as noted by education test scores.  Learning Barriers/Preferences: Learning Barriers/Preferences - 08/25/18 1421      Learning Barriers/Preferences   Learning Barriers  None    Learning Preferences  None       Education Topics:  AED/CPR: - Group verbal and written instruction with the use of models to demonstrate the basic use of the AED with the basic ABC's of resuscitation.   General Nutrition Guidelines/Fats and Fiber: -Group instruction provided by verbal, written material, models and posters to present the general guidelines for heart healthy nutrition. Gives an explanation and review of dietary fats and fiber.   Cardiac Rehab from 12/15/2018 in [facility] Cardiac and Pulmonary Rehab  Date  12/01/18  Educator  [facility]  Instruction Review Code  1- Verbalizes Understanding      Controlling Sodium/Reading Food Labels: -Group verbal and written material supporting the discussion of sodium use in heart healthy nutrition. Review and explanation with models, verbal and written materials for utilization of the food label.   Exercise Physiology  & General Exercise Guidelines: - Group verbal and written instruction with models to review the exercise physiology of the cardiovascular system and associated critical values. Provides general exercise guidelines with specific guidelines to those with heart or lung disease.    Aerobic Exercise & Resistance Training: - Gives group verbal and written instruction on the various components of exercise. Focuses on aerobic and resistive training programs and the benefits of this training and how to safely progress through these programs..   Cardiac Rehab from 12/15/2018 in [facility] Cardiac and Pulmonary Rehab  Date  11/17/18  Educator  jh  Instruction Review Code  1- Verbalizes Understanding      Flexibility, Balance, Mind/Body Relaxation: Provides group verbal/written instruction on the benefits of flexibility and balance training, including mind/body exercise modes such as yoga, pilates and tai chi.  Demonstration and skill practice provided.   Cardiac Rehab from 12/15/2018 in [facility] Cardiac and Pulmonary Rehab  Date  12/15/18  Educator  AS  Instruction Review Code  1- Verbalizes Understanding      Stress and Anxiety: - Provides group verbal and written instruction about the health risks of elevated stress and causes of high stress.  Discuss the correlation between heart/lung disease and anxiety and treatment options. Review healthy ways to manage with stress and anxiety.   Depression: - Provides group verbal and written instruction on the correlation between heart/lung disease and depressed mood, treatment options, and the stigmas associated with seeking treatment.   Anatomy & Physiology of the Heart: - Group verbal and written instruction and models provide basic cardiac anatomy and physiology, with the coronary electrical and arterial systems. Review of Valvular disease and Heart Failure   Cardiac Procedures: - Group verbal and written instruction to review commonly prescribed medications  for heart disease. Reviews the medication, class of the drug, and side effects. Includes the steps to properly store meds and maintain the prescription regimen. (beta blockers and nitrates)   Cardiac Rehab from 12/15/2018 in [facility] Cardiac and Pulmonary Rehab  Date  11/17/18  Educator  mc  Instruction Review Code  1- Verbalizes Understanding      Cardiac Medications I: - Group verbal and written instruction to review commonly prescribed medications for heart disease. Reviews the medication, class of the drug, and side effects. Includes the steps  to properly store meds and maintain the prescription regimen.   Cardiac Medications II: -Group verbal and written instruction to review commonly prescribed medications for heart disease. Reviews the medication, class of the drug, and side effects. (all other drug classes)   Cardiac Rehab from 11/03/2018 in [facility] Cardiac and Pulmonary Rehab  Date  11/03/18  Educator  MC  Instruction Review Code  1- Verbalizes Understanding       Go Sex-Intimacy & Heart Disease, Get SMART - Goal Setting: - Group verbal and written instruction through game format to discuss heart disease and the return to sexual intimacy. Provides group verbal and written material to discuss and apply goal setting through the application of the S.M.A.R.T. Method.   Cardiac Rehab from 12/15/2018 in [facility] Cardiac and Pulmonary Rehab  Date  11/17/18  Educator  mc  Instruction Review Code  1- Verbalizes Understanding      Other Matters of the Heart: - Provides group verbal, written materials and models to describe Stable Angina and Peripheral Artery. Includes description of the disease process and treatment options available to the cardiac patient.   Exercise & Equipment Safety: - Individual verbal instruction and demonstration of equipment use and safety with use of the equipment.   Cardiac Rehab from 08/29/2018 in [facility] Cardiac and Pulmonary Rehab  Date  08/29/18  Educator  JH   Instruction Review Code  1- Verbalizes Understanding      Infection Prevention: - Provides verbal and written material to individual with discussion of infection control including proper hand washing and proper equipment cleaning during exercise session.   Cardiac Rehab from 08/29/2018 in [facility] Cardiac and Pulmonary Rehab  Date  08/29/18  Educator  JH  Instruction Review Code  1- Verbalizes Understanding      Falls Prevention: - Provides verbal and written material to individual with discussion of falls prevention and safety.   Cardiac Rehab from 08/29/2018 in [facility] Cardiac and Pulmonary Rehab  Date  08/29/18  Educator  JH  Instruction Review Code  1- Verbalizes Understanding      Diabetes: - Individual verbal and written instruction to review signs/symptoms of diabetes, desired ranges of glucose level fasting, after meals and with exercise. Acknowledge that pre and post exercise glucose checks will be done for 3 sessions at entry of program.   Know Your Numbers and Risk Factors: -Group verbal and written instruction about important numbers in your health.  Discussion of what are risk factors and how they play a role in the disease process.  Review of Cholesterol, Blood Pressure, Diabetes, and BMI and the role they play in your overall health.   Cardiac Rehab from 11/03/2018 in [facility] Cardiac and Pulmonary Rehab  Date  11/03/18  Educator  MC  Instruction Review Code  1- Verbalizes Understanding      Sleep Hygiene: -Provides group verbal and written instruction about how sleep can affect your health.  Define sleep hygiene, discuss sleep cycles and impact of sleep habits. Review good sleep hygiene tips.    Other: -Provides group and verbal instruction on various topics (see comments)   Knowledge Questionnaire Score: Knowledge Questionnaire Score - 08/29/18 1243      Knowledge Questionnaire Score   Pre Score  20/26   Education Focus: Angina, Heart Failure, Nutrition, Exercise       Core Components/Risk Factors/Patient Goals at Admission: Personal Goals and Risk Factors at Admission - 08/29/18 1244      Core Components/Risk Factors/Patient Goals on Admission    Weight   Management  Yes;Weight Maintenance    Intervention  Weight Management: Develop a combined nutrition and exercise program designed to reach desired caloric intake, while maintaining appropriate intake of nutrient and fiber, sodium and fats, and appropriate energy expenditure required for the weight goal.;Weight Management: Provide education and appropriate resources to help participant work on and attain dietary goals.    Admit Weight  172 lb 8 oz (78.2 kg)    Goal Weight: Short Term  172 lb (78 kg)    Goal Weight: Long Term  172 lb (78 kg)    Expected Outcomes  Short Term: Continue to assess and modify interventions until short term weight is achieved;Long Term: Adherence to nutrition and physical activity/exercise program aimed toward attainment of established weight goal;Weight Maintenance: Understanding of the daily nutrition guidelines, which includes 25-35% calories from fat, 7% or less cal from saturated fats, less than 200mg cholesterol, less than 1.5gm of sodium, & 5 or more servings of fruits and vegetables daily    Intervention  Provide education about signs/symptoms and action to take for hypo/hyperglycemia.;Provide education about proper nutrition, including hydration, and aerobic/resistive exercise prescription along with prescribed medications to achieve blood glucose in normal ranges: Fasting glucose 65-99 mg/dL    Expected Outcomes  Short Term: Participant verbalizes understanding of the signs/symptoms and immediate care of hyper/hypoglycemia, proper foot care and importance of medication, aerobic/resistive exercise and nutrition plan for blood glucose control.;Long Term: Attainment of HbA1C < 7%.    Heart Failure  Yes    Intervention  Provide a combined exercise and nutrition program that is  supplemented with education, support and counseling about heart failure. Directed toward relieving symptoms such as shortness of breath, decreased exercise tolerance, and extremity edema.    Expected Outcomes  Improve functional capacity of life;Short term: Attendance in program 2-3 days a week with increased exercise capacity. Reported lower sodium intake. Reported increased fruit and vegetable intake. Reports medication compliance.;Short term: Daily weights obtained and reported for increase. Utilizing diuretic protocols set by physician.;Long term: Adoption of self-care skills and reduction of barriers for early signs and symptoms recognition and intervention leading to self-care maintenance.    Hypertension  Yes    Intervention  Provide education on lifestyle modifcations including regular physical activity/exercise, weight management, moderate sodium restriction and increased consumption of fresh fruit, vegetables, and low fat dairy, alcohol moderation, and smoking cessation.;Monitor prescription use compliance.    Expected Outcomes  Short Term: Continued assessment and intervention until BP is < 140/90mm HG in hypertensive participants. < 130/80mm HG in hypertensive participants with diabetes, heart failure or chronic kidney disease.;Long Term: Maintenance of blood pressure at goal levels.    Lipids  Yes    Intervention  Provide education and support for participant on nutrition & aerobic/resistive exercise along with prescribed medications to achieve LDL <70mg, HDL >40mg.    Expected Outcomes  Short Term: Participant states understanding of desired cholesterol values and is compliant with medications prescribed. Participant is following exercise prescription and nutrition guidelines.;Long Term: Cholesterol controlled with medications as prescribed, with individualized exercise RX and with personalized nutrition plan. Value goals: LDL < 70mg, HDL > 40 mg.       Core Components/Risk Factors/Patient  Goals Review:  Goals and Risk Factor Review    Row Name 10/06/18 1019 11/08/18 1010 11/10/18 0951 11/24/18 0944 12/29/18 1138     Core Components/Risk Factors/Patient Goals Review   Personal Goals Review  Hypertension;Lipids;Other  Weight Management/Obesity;Improve shortness of breath with ADL's;Diabetes;Hypertension;Lipids  Weight Management/Obesity;Improve   shortness of breath with ADL's;Diabetes;Hypertension;Lipids  Weight Management/Obesity;Improve shortness of breath with ADL's;Diabetes;Hypertension;Lipids  Weight Management/Obesity;Improve shortness of breath with ADL's;Diabetes;Hypertension;Lipids   Review  [name] is taking all meds as directed.  He is trying to eat more vegetables and less fried food.  He is walking some outside class.He does check BP at home and it stays stable.  Mivaan feels like he gets short of breath more now than he was after his first heart attack. His blood pressure has been good but was slightly low today. His lipids have been good. He takes his cholesterol pill at night before bed.  [name] has been doing well in rehab.  His weight is down some overall.  He has been compliant with his medications.  His pressures have been good in class but he does not check it at one.  We talked about getting cuff out and taking it at home.  Blood sugars are getting better.  His A1c was down to 6.2 yesterday at the [facility]!!  He is still having some SOB and has not noticed improvement yet.  [name] is doing well.  They cut his losartan in half and he feels much better.  His blood pressure is no longer dropping when he checks it at home.  A1c is 6.2 and he is no longer taking anything for it.  His SOB is still linger but it is getting better. We talked using PLB to help manage his SOB.  His weight is rock solid steady!!  [name] is doing well.  His weight is holding steady.  Blood pressures and blood sugars have done well and he continues to monitor them at home.  He is doing better with his SOB.   Expected  Outcomes  Short - continue heart healthy changes Long - maintain heart healthy habits  Short: Attend [facility] regularly to improve shortness of breath with ADL's. Long: maintain independence with ADL's  Short: Get blood pressure cuff out and start checking at home.  Long: Continue to work on weight loss.  Short: Use PLB to manage breathing.  Long: Continue to manage risk factors.  Short: Continue to work on breathing.  Long: Continue to monitor risk factors.      Core Components/Risk Factors/Patient Goals at Discharge (Final Review):  Goals and Risk Factor Review - 12/29/18 1138      Core Components/Risk Factors/Patient Goals Review   Personal Goals Review  Weight Management/Obesity;Improve shortness of breath with ADL's;Diabetes;Hypertension;Lipids    Review  [name] is doing well.  His weight is holding steady.  Blood pressures and blood sugars have done well and he continues to monitor them at home.  He is doing better with his SOB.    Expected Outcomes  Short: Continue to work on breathing.  Long: Continue to monitor risk factors.       ITP Comments: ITP Comments    Row Name 08/25/18 1430 08/29/18 1215 09/13/18 1107 09/21/18 0600 10/19/18 1333   ITP Comments  nitial Orientation Virtual Call completed.   EP/RD appt is on 8/17 Diagnosis documentation can be found in MEDIA tab [facility] records  Completed 6MWT, gym orientation, and RD evaluation. Initial ITP created and sent for review to Dr. Emily Filbert, Medical Director.  Returning call to pt. He called to leave [place] a message that he has cough and congestion.  They had a COVID test done and it was negative.  He is taking some medicine now to feel better.  Encouraged him to stay home until he  is symptom free for 48 hours.  30 Day review. Continue with ITP unless directed changes per Medical Director review.  Still out recuperating from cough/congestion  30 day review completed. ITP sent to Dr. Emily Filbert, Medical Director of Cardiac and Pulmonary  Rehab. Continue with ITP unless changes are made by physician.  Department closed starting 10/2 until further notice by infection prevention and Health at Work teams for [place].   [place] Name 10/25/18 1044 11/01/18 1117 11/10/18 0945 11/16/18 0623 12/14/18 0937   ITP Comments  Called to check on pt.  He was admitted to hospital for vtach with two shocks.   He has a follow up on Friday with his cardiologist.  He is feeling better. He hopes to return next week.  [name] returned to program after being out several weeks for [employer]. His ICD was readjusted to HR of 180.  HIs doctors at the [place] told him to resume his rehab  [name] was in [place] ED yesterday per their order running test.  They released him home.  30 day review completed. Continue with ITP sent to Dr. Emily Filbert, Medical Director of Cardiac and Pulmonary Rehab for review , changes as needed and signature.  30 day review competed . ITP sent to Dr Emily Filbert for review, changes as needed and ITP approval signature.   [place] Name 01/11/19 [phone] 01/26/19 1123 02/08/19 0949       ITP Comments  30 day review competed . ITP sent to Dr Emily Filbert for review, changes as needed and ITP approval signature  Virtual call completed today. Calls are done every week that patient is not attending an onsite exercise session during the COVID 19 PHE Crisis.   Today's call included review of :a psychosocial review and his exercise routine at home. [name] has been relaxing his exercise routine this week after an ablation that was completed last week.  He stated he is still nervous to do too much since the ablation: not knowing if ICD will go off and shock him. He has an appointment today with a clinic and will address a note to return to exercise with his physician.   Follow up call is scheduled for 02/09/2019 930AM  30 day review completed. ITP sent to Dr. Emily Filbert, Medical Director of Cardiac and Pulmonary Rehab. Continue with ITP unless changes are made by physician.  Department  operating under reduced schedule until further notice by request from hospital leadership.        Comments: 30 day review

## 2019-02-16 ENCOUNTER — Ambulatory Visit: Payer: Medicare Other

## 2019-02-20 ENCOUNTER — Telehealth: Payer: Self-pay

## 2019-02-20 NOTE — Telephone Encounter (Signed)
Peter Frey sees his Dr tomorrow at [facility] and will get written clearance to return.  He will call to schedule next visit.

## 2019-02-21 ENCOUNTER — Ambulatory Visit (INDEPENDENT_AMBULATORY_CARE_PROVIDER_SITE_OTHER): Payer: Medicare Other | Admitting: Internal Medicine

## 2019-02-21 ENCOUNTER — Other Ambulatory Visit: Payer: Self-pay

## 2019-02-21 ENCOUNTER — Encounter: Payer: Self-pay | Admitting: Internal Medicine

## 2019-02-21 VITALS — BP 118/68 | HR 60 | Temp 97.3°F | Ht 72.0 in | Wt 172.2 lb

## 2019-02-21 DIAGNOSIS — J449 Chronic obstructive pulmonary disease, unspecified: Secondary | ICD-10-CM | POA: Diagnosis not present

## 2019-02-21 NOTE — Patient Instructions (Signed)
Patient is to get further evaluation at the [facility] pulmonary

## 2019-02-21 NOTE — Progress Notes (Signed)
Name: Peter Frey MRN: [id number] DOB: Nov 24, 1950     CONSULTATION DATE: 02/21/2019  [facility]  Studies Echo 10/20 - 1. Left ventricular ejection fraction, by visual estimation, is 30 to  35%. The left ventricle has severely decreased function. Moderate to  severely increased left ventricular size. There is no left ventricular  hypertrophy.  2. Elevated mean left atrial pressure.  3. Left ventricular diastolic Doppler parameters are consistent with  impaired relaxation pattern of LV diastolic filling.  4. There is global hypokinesis as well as basal inferior/inferoseptal  akinesis.  5. Global right ventricle has moderately reduced systolic function.The  right ventricular size is mildly enlarged. No increase in right  ventricular wall thickness.       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  Previous 2 pack a day smoker for 40 years chronic shortness of breath and dyspnea exertion  He can walk 20 feet without getting short of breath  No  COPD exacerbation at this time No evidence of heart failure at this time No evidence or signs of infection at this time No respiratory distress No fevers, chills, nausea, vomiting, diarrhea No evidence of lower extremity edema No evidence hemoptysis   Cardiac history includes defibrillator Has history of CHF with CAD Sees cardiologist at [facility] No signs of heart failure at this time   With regards to his diagnosis of COPD he has a pulmonologist at the [facility] who is following his pulmonary status   At this time I do not have any of these records and I do not want to repeat any testing the patient does not need     PAST MEDICAL HISTORY :   has a past medical history of CHF (congestive heart failure) (HCC), COPD (chronic obstructive  pulmonary disease) (HCC), Coronary artery disease, and MI (myocardial infarction) (HCC).  has a past surgical history that includes Coronary artery bypass graft; Cardiac defibrillator placement; Coronary/Graft Acute MI Revascularization (N/A, 10/18/2018); LEFT HEART CATH AND CORS/GRAFTS ANGIOGRAPHY (N/A, 10/18/2018); and INTRAVASCULAR PRESSURE WIRE/FFR STUDY (N/A, 10/18/2018). Prior to Admission medications   Medication Sig Start Date End Date Taking? Authorizing Provider  albuterol (VENTOLIN HFA) 108 (90 Base) MCG/ACT inhaler Inhale into the lungs.   Yes [provider]  aspirin 81 MG tablet Take 81 mg by mouth daily.   Yes [provider]  gabapentin (NEURONTIN) 300 MG capsule Take 300 mg by mouth 3 (three) times daily.   Yes [provider]  losartan (COZAAR) 50 MG tablet Take by mouth.   Yes [provider]  metoprolol (LOPRESSOR) 50 MG tablet Take 50 mg by mouth 2 (two) times daily.   Yes [provider]  simvastatin (ZOCOR) 20 MG tablet Take 20 mg by mouth daily.   Yes [provider]  spironolactone (ALDACTONE) 25 MG tablet Take by mouth. 08/03/17  Yes [provider]  sildenafil (VIAGRA) 50 MG tablet Take by mouth.    [provider]   Allergies  Allergen Reactions  . Lisinopril Swelling    Lip swelling    FAMILY HISTORY:  family history includes Aneurysm in his father and mother. SOCIAL HISTORY:  reports that he quit smoking about 6 years ago. His smoking use included cigarettes. He has a 86.00 pack-year smoking history. He has never used smokeless tobacco.  He reports current alcohol use. He reports previous drug use. Drugs: Cocaine and Marijuana.    Review of Systems:  Gen:  Denies  fever, sweats, chills weight loss  HEENT: Denies blurred vision, double vision, ear pain, eye pain, hearing loss, nose bleeds, sore throat Cardiac:  No dizziness, chest pain or heaviness, chest tightness,edema, No JVD Resp:   No  cough, -sputum production, -shortness of breath,-wheezing, -hemoptysis,  Gi: Denies swallowing difficulty, stomach pain, nausea or vomiting, diarrhea, constipation, bowel incontinence Gu:  Denies bladder incontinence, burning urine Ext:   Denies Joint pain, stiffness or swelling Skin: Denies  skin rash, easy bruising or bleeding or hives Endoc:  Denies polyuria, polydipsia , polyphagia or weight change Psych:   Denies depression, insomnia or hallucinations  Other:  All other systems negative   BP 118/68 (BP Location: Right Arm, Patient Position: Sitting, Cuff Size: Large)   Pulse 60   Temp (!) 97.3 F (36.3 C) (Temporal)   Ht 6' (1.829 m)   Wt 172 lb 3.2 oz (78.1 kg)   SpO2 100% Comment: on ra  BMI 23.35 kg/m    Physical Examination:   General Appearance: No distress  Neuro:without focal findings,  speech normal,  HEENT: PERRLA, EOM intact.   Pulmonary: normal breath sounds, No wheezing.  CardiovascularNormal S1,S2.  No m/r/g.   Abdomen: Benign, Soft, non-tender. Renal:  No costovertebral tenderness  GU:  Not performed at this time. Endoc: No evident thyromegaly Skin:   warm, no rashes, no ecchymosis  Extremities: normal, no cyanosis, clubbing. PSYCHIATRIC: Mood, affect within normal limits.   ALL OTHER ROS ARE NEGATIVE   MEDICATIONS: I have reviewed all medications and confirmed regimen as documented       ASSESSMENT AND PLAN SYNOPSIS  69 year old pleasant African-American male with multiple medical issues including severe cardiomyopathy from severe coronary artery disease in the setting of probable underlying severe COPD with chronic dyspnea exertion and and shortness of breath in the setting of former extensive tobacco abuse and smoking history with severe deconditioned state with poor respiratory insufficiency and cardiac insufficiency  Patient currently on  Spiriva Albuterol as needed  At this time patient states he is received so many tests at the [place] and he  does not seem like he wants to repeat any testing at our facility at this time I recommend that he repeat pulmonary function testing along with a 6-minute walk test and overnight pulse oximetry to assess for exertional and nocturnal hypoxia  [facility] I have encouraged patient to follow-up with his pulmonologist at the [facility] for further assessment   I also recommend lung cancer screening referral program of which patient states that the [place] pulmonary is handling this as well  Further assessment I have explained to the patient that since he is getting all these tests completed and performed at the [place] with the pulmonary department and I recommend that he be released from our practice so that we do not duplicate any tests   Patient is very satisfied with that plan and would like to follow-up at the [facility] pulmonary    COVID-19 EDUCATION: The signs and symptoms of COVID-19 were discussed with the patient and how to seek care for testing.  The importance of social distancing was discussed today. Hand Washing Techniques and avoid touching face was advised.  CURRENT MEDICATIONS REVIEWED AT LENGTH WITH PATIENT TODAY   Patient satisfied with Plan of action and management.  All questions answered    Corrin Parker, M.D.  [facility]  Medical Director [place] Director [facility] Cardio-Pulmonary Department

## 2019-02-27 ENCOUNTER — Ambulatory Visit: Payer: Medicare Other

## 2019-03-02 ENCOUNTER — Ambulatory Visit: Payer: Medicare Other

## 2019-03-06 ENCOUNTER — Telehealth: Payer: Self-pay

## 2019-03-06 NOTE — Telephone Encounter (Signed)
Still "weak in the legs" - needs to get clearance - going to call Dr. And have them fax clearance

## 2019-03-08 ENCOUNTER — Encounter: Payer: Self-pay | Admitting: *Deleted

## 2019-03-08 DIAGNOSIS — I429 Cardiomyopathy, unspecified: Secondary | ICD-10-CM

## 2019-03-08 NOTE — Progress Notes (Signed)
Cardiac Individual Treatment Plan  Patient Details  Name: Peter Frey MRN: [id number] Date of Birth: Oct 19, 1950 Referring Provider:     Cardiac Rehab from 08/29/2018 in [facility] Cardiac and Pulmonary Rehab  Referring Provider  Nicholes Mango MD      Initial Encounter Date:    Cardiac Rehab from 08/29/2018 in [facility] Cardiac and Pulmonary Rehab  Date  08/29/18      Visit Diagnosis: Cardiomyopathy, unspecified type ([place])  Patient's Home Medications on Admission:  Current Outpatient Medications:  .  acetaminophen (TYLENOL) 500 MG tablet, Take 500 mg by mouth every 6 (six) hours as needed., Disp: , Rfl:  .  albuterol (VENTOLIN HFA) 108 (90 Base) MCG/ACT inhaler, Inhale into the lungs., Disp: , Rfl:  .  aspirin 81 MG tablet, Take 81 mg by mouth daily., Disp: , Rfl:  .  furosemide (LASIX) 40 MG tablet, Take 40 mg by mouth daily. , Disp: , Rfl:  .  gabapentin (NEURONTIN) 300 MG capsule, Take 300 mg by mouth 3 (three) times daily., Disp: , Rfl:  .  losartan (COZAAR) 25 MG tablet, Take 25 mg by mouth daily. , Disp: , Rfl:  .  metoprolol (LOPRESSOR) 50 MG tablet, Take 50 mg by mouth 2 (two) times daily., Disp: , Rfl:  .  sildenafil (VIAGRA) 50 MG tablet, Take by mouth as needed. , Disp: , Rfl:  .  simvastatin (ZOCOR) 20 MG tablet, Take 20 mg by mouth daily., Disp: , Rfl:  .  spironolactone (ALDACTONE) 25 MG tablet, Take 25 mg by mouth daily. , Disp: , Rfl:  .  TIOTROPIUM BROMIDE-OLODATEROL IN, Inhale 2 Inhalers into the lungs daily., Disp: , Rfl:   Past Medical History: Past Medical History:  Diagnosis Date  . CHF (congestive heart failure) ([place])   . COPD (chronic obstructive pulmonary disease) ([place])   . Coronary artery disease   . MI (myocardial infarction) ([place])     Tobacco Use: Social History   Tobacco Use  Smoking Status Former Smoker  . Packs/day: 2.00  . Years: 43.00  . Pack years: 86.00  . Types: Cigarettes  . Quit date: 2015  . Years since quitting: 6.1  Smokeless Tobacco  Never Used    Labs: Recent Review Scientist, physiological    Labs for ITP Cardiac and Pulmonary Rehab Latest Ref Rng & Units 10/18/2018   Cholestrol 0 - 200 mg/dL 94   LDLCALC 0 - 99 mg/dL 29   HDL >40 mg/dL 50   Trlycerides <150 mg/dL 73       Exercise Target Goals: Exercise Program Goal: Individual exercise prescription set using results from initial 6 min walk test and THRR while considering  patient's activity barriers and safety.   Exercise Prescription Goal: Initial exercise prescription builds to 30-45 minutes a day of aerobic activity, 2-3 days per week.  Home exercise guidelines will be given to patient during program as part of exercise prescription that the participant will acknowledge.  Activity Barriers & Risk Stratification:   6 Minute Walk:   Oxygen Initial Assessment:   Oxygen Re-Evaluation:   Oxygen Discharge (Final Oxygen Re-Evaluation):   Initial Exercise Prescription:   Perform Capillary Blood Glucose checks as needed.  Exercise Prescription Changes: Exercise Prescription Changes    Row Name 10/06/18 1000 10/25/18 1000 11/03/18 1600 11/16/18 1300 12/02/18 1200     Response to Exercise   Blood Pressure (Admit)  --  122/64  110/70  122/56  110/64   Blood Pressure (Exercise)  --  124/64  138/62  120/70  128/76   Blood Pressure (Exit)  --  108/56  100/62  116/66  102/60   Heart Rate (Admit)  --  70 bpm  68 bpm  76 bpm  82 bpm   Heart Rate (Exercise)  --  95 bpm  106 bpm  108 bpm  104 bpm   Heart Rate (Exit)  --  76 bpm  67 bpm  80 bpm  68 bpm   Rating of Perceived Exertion (Exercise)  --  _0 Symptoms  --  none  none  none  none   Duration  --  Continue with 30 min of aerobic exercise without signs/symptoms of physical distress.  Continue with 30 min of aerobic exercise without signs/symptoms of physical distress.  Continue with 30 min of aerobic exercise without signs/symptoms of physical distress.  Continue with 30 min of aerobic exercise  without signs/symptoms of physical distress.   Intensity  --  THRR unchanged  THRR unchanged  THRR unchanged  THRR unchanged     Progression   Progression  --  Continue to progress workloads to maintain intensity without signs/symptoms of physical distress.  Continue to progress workloads to maintain intensity without signs/symptoms of physical distress.  Continue to progress workloads to maintain intensity without signs/symptoms of physical distress.  Continue to progress workloads to maintain intensity without signs/symptoms of physical distress.   Average METs  --  2.68  2.5  2.91  2.6     Resistance Training   Training Prescription  --  Yes  Yes  Yes  Yes   Weight  --  4 lbs  4 lb  4 lbs  5 lb   Reps  --  10-15  10-15  10-15  10-15     Interval Training   Interval Training  --  No  No  No  No     Treadmill   MPH  --  2._1 Grade  --  0  0  0  0   Minutes  --  _2 METs  --  2.91  2.53  2.53  2.53     Recumbant Bike   Level  --  --  --  3  --   Minutes  --  --  --  15  --   METs  --  --  --  3.2  --     NuStep   Level  --  _3 Minutes  --  _4 METs  --  2.7  2.3 T5 - alternate station  3  2.8     Recumbant Elliptical   Level  --  2.5  --  --  4   Minutes  --  15  --  --  15   METs  --  2.2  --  --  2.2     Home Exercise Plan   Plans to continue exercise at  Home (comment) considering gym  Home (comment) considering gym  Home (comment) considering gym  Home (comment) considering gym  Home (comment) considering gym   Frequency  Add 2 additional days to program exercise sessions.  Add 2 additional days to program exercise sessions.  Add 2 additional days to program exercise sessions.  Add 2 additional days to program exercise  sessions.  Add 2 additional days to program exercise sessions.   Initial Home Exercises Provided  10/06/18  10/06/18  10/06/18  10/06/18  10/06/18   Row Name 12/15/18 1000 12/30/18 1300 01/12/19 1000          Response to Exercise   Blood Pressure (Admit)  112/64  124/64  126/64     Blood Pressure (Exercise)  120/60  126/66  128/64     Blood Pressure (Exit)  100/60  124/60  116/64     Heart Rate (Admit)  70 bpm  58 bpm  73 bpm     Heart Rate (Exercise)  107 bpm  84 bpm  99 bpm     Heart Rate (Exit)  73 bpm  66 bpm  67 bpm     Rating of Perceived Exertion (Exercise)  '14  13  15     '$ Symptoms  none  none  none     Duration  Continue with 30 min of aerobic exercise without signs/symptoms of physical distress.  Continue with 30 min of aerobic exercise without signs/symptoms of physical distress.  Continue with 30 min of aerobic exercise without signs/symptoms of physical distress.     Intensity  THRR unchanged  THRR unchanged  THRR unchanged       Progression   Progression  Continue to progress workloads to maintain intensity without signs/symptoms of physical distress.  Continue to progress workloads to maintain intensity without signs/symptoms of physical distress.  Continue to progress workloads to maintain intensity without signs/symptoms of physical distress.     Average METs  2.77  2.5  2.6       Resistance Training   Training Prescription  Yes  Yes  Yes     Weight  5 lb  5 lb  5 lb     Reps  10-15  10-15  10-15       Interval Training   Interval Training  No  No  No       Recumbant Bike   Level  3  --  3     RPM  --  --  60     Minutes  15  --  15     METs  3.3  --  2.9       NuStep   Level  '4  5  5     '$ Minutes  '15  15  15     '$ METs  2.8  3  2.3       Recumbant Elliptical   Level  4  2  --     Minutes  15  15  --     METs  2.2  2  --       Home Exercise Plan   Plans to continue exercise at  Home (comment) considering gym  Home (comment) considering gym  Home (comment) considering gym     Frequency  Add 2 additional days to program exercise sessions.  Add 2 additional days to program exercise sessions.  Add 2 additional days to program exercise sessions.     Initial Home  Exercises Provided  10/06/18  10/06/18  10/06/18        Exercise Comments: Exercise Comments    Row Name 09/23/18 1301 01/26/19 1123         Exercise Comments  Peter Frey has been out since last review due to illness  Virtual call completed today. Calls are done every week that patient is not attending  an onsite exercise session during the [place] [facility] PHE Crisis.   Today's call included review of :a psychosocial review and his exercise routine at home. Peter Frey has been relaxing his exercise routine this week after an ablation that was completed last week.  He stated he is still nervous to do too much since the ablation: not knowing if ICD will go off and shock him. He has an appointment today with a clinic and will address a note to return to exercise with his physician.   Follow up call is scheduled for 02/09/2019 930AM         Exercise Goals and Review:   Exercise Goals Re-Evaluation : Exercise Goals Re-Evaluation    Row Name 10/25/18 1044 11/03/18 1614 11/10/18 0955 11/16/18 1336 11/24/18 0941     Exercise Goal Re-Evaluation   Exercise Goals Review  --  Increase Physical Activity;Increase Strength and Stamina;Able to understand and use rate of perceived exertion (RPE) scale;Knowledge and understanding of Target Heart Rate Range (THRR);Able to check pulse independently;Understanding of Exercise Prescription  Increase Physical Activity;Increase Strength and Stamina;Understanding of Exercise Prescription  Increase Physical Activity;Increase Strength and Stamina;Understanding of Exercise Prescription  Increase Physical Activity;Increase Strength and Stamina;Understanding of Exercise Prescription   Comments  Peter Frey has been out since 9/24.  This is Peter Frey first week back in almost a month.  He is building  back to previous levels.  Peter Frey has been doing well in rehab.  He has not done much at home this month with everything that is going on.  He is still having some SOB.  He has not noticed an improvement  with strength and stamina yet.  He is able to do what he wants but modifies it some.  Peter Frey continues to do well in rehab.  He has been having some issues with his blood pressures at home.  He is on level 4 on the NuStep.  We will continue to monitor his progress.  Peter Frey is doing well in rehab.  We are trying increase his mobility on the treadmill.  We talked about doing the SET program after graduation.  He has not been doing his home exercise.  He is going to try to get back to it.  He usually walks at home but since his medical scare he backed off.  He is feeling better now.   Expected Outcomes  --  Short - get back on track consistently Long - increase MET level  Short: Get back to exercising on off days.  Long: Continue to improve stamina.  Short: Add incline on treadmill  Long: Continue increase activity levels.  Short: Get back to walking at home.  Long: Talk to [place] about PAD SET program.   Row Name 12/02/18 1223 12/15/18 1020 12/29/18 1132 12/30/18 1336 01/12/19 1045     Exercise Goal Re-Evaluation   Exercise Goals Review  Increase Physical Activity;Increase Strength and Stamina;Able to understand and use rate of perceived exertion (RPE) scale;Able to understand and use Dyspnea scale;Knowledge and understanding of Target Heart Rate Range (THRR);Able to check pulse independently;Understanding of Exercise Prescription  Increase Physical Activity;Increase Strength and Stamina;Understanding of Exercise Prescription  Increase Physical Activity;Increase Strength and Stamina;Understanding of Exercise Prescription  Increase Physical Activity;Increase Strength and Stamina;Able to understand and use rate of perceived exertion (RPE) scale;Knowledge and understanding of Target Heart Rate Range (THRR);Able to check pulse independently;Understanding of Exercise Prescription  Increase Physical Activity;Increase Strength and Stamina;Able to understand and use rate of perceived exertion (RPE) scale;Able to understand and  use Dyspnea scale;Knowledge and understanding of Target Heart Rate Range (THRR);Able to check pulse independently;Understanding of Exercise Prescription   Comments  Peter Frey continues to do well.  He attends consistently and works at proper RPE when he is here.  Peter Frey continues to do well in rehab.  He is on level 4 for NuStep and getting 3.3 METs for bike.  We will continue to monitor his progress.  Peter Frey continues to do well in rehab.  He has been walking some at home.  He is building back up.  He is feeling stronger and has more stamina.  Peter Frey has tried the TM and elliptical.  He had some claudication pain on the TM and elliptical was challenging for him.  Peter Frey continues to do well in rehab.  Staff will monitor progress.   Expected Outcomes  Short - add walking outside HT sessions Long : maintain exercise  Short: Talk about adding in intervals. Long: Continue to improve stamina.  Short: Continue to build exercise back up at home. Long: Continue to improve stamina.  Short - continue to try TM and elliptical  Long : build overall stamina  Short - try Tm and elliptical nest sessions Long - continue to build stamina      Discharge Exercise Prescription (Final Exercise Prescription Changes): Exercise Prescription Changes - 01/12/19 1000      Response to Exercise   Blood Pressure (Admit)  126/64    Blood Pressure (Exercise)  128/64    Blood Pressure (Exit)  116/64    Heart Rate (Admit)  73 bpm    Heart Rate (Exercise)  99 bpm    Heart Rate (Exit)  67 bpm    Rating of Perceived Exertion (Exercise)  15    Symptoms  none    Duration  Continue with 30 min of aerobic exercise without signs/symptoms of physical distress.    Intensity  THRR unchanged      Progression   Progression  Continue to progress workloads to maintain intensity without signs/symptoms of physical distress.    Average METs  2.6      Resistance Training   Training Prescription  Yes    Weight  5 lb    Reps  10-15      Interval  Training   Interval Training  No      Recumbant Bike   Level  3    RPM  60    Minutes  15    METs  2.9      NuStep   Level  5    Minutes  15    METs  2.3      Home Exercise Plan   Plans to continue exercise at  Home (comment)   considering gym   Frequency  Add 2 additional days to program exercise sessions.    Initial Home Exercises Provided  10/06/18       Nutrition:  Target Goals: Understanding of nutrition guidelines, daily intake of sodium '1500mg'$ , cholesterol '200mg'$ , calories 30% from fat and 7% or less from saturated fats, daily to have 5 or more servings of fruits and vegetables.  Biometrics:    Nutrition Therapy Plan and Nutrition Goals:   Nutrition Assessments:   Nutrition Goals Re-Evaluation: Nutrition Goals Re-Evaluation    Row Name 10/04/18 1016 11/08/18 1015 11/15/18 1138 12/06/18 1012 12/29/18 1007     Goals   Current Weight  --  169 lb (76.7 kg)  --  --  --   Nutrition Goal  ST:  when pt does not have breakfast, have mid-morning protein snack LT: gain energy,  reduce SOB  Maintain weight. Drink more water.  Maintain weight. Drink more water.  ST: continue with curent diet LT: get back to baseline  ST: continue with curent diet LT: get back to baseline   Comment  Pt reports eating the same way since the last time we spoke, discussed the importance of getting enough protein and mechanical eating when he is not hungry.  Patient states that he does not drink much water. He has at least 3 regular [employer] a day. He does not check his blood sugar at home. He is eating more baked goods and intaking more vegetables. Spokes to patient about his low blood pressure and informed him that he may need to drink more water.  Continue with current changes  Pt reports drinking more water, currently no sodas in the house. Pt reports no new changes in diet and would like to continue with current diet.  Pt reports no new changes in diet and would like to continue with current  diet.   Expected Outcome  ST: when pt does not have breakfast, have mid-morning protein snack LT: gain energy,  reduce SOB  Short:cut back on mountain dew and drink water. Long: maintain drinking water and drink mountain dew seldome.  Short:cut back on mountain dew and drink water. Long: maintain drinking water and drink mountain dew seldome.  ST: continue with curent diet LT: get back to baseline  ST: continue with curent diet LT: get back to baseline      Nutrition Goals Discharge (Final Nutrition Goals Re-Evaluation): Nutrition Goals Re-Evaluation - 12/29/18 1007      Goals   Nutrition Goal  ST: continue with curent diet LT: get back to baseline    Comment  Pt reports no new changes in diet and would like to continue with current diet.    Expected Outcome  ST: continue with curent diet LT: get back to baseline       Psychosocial: Target Goals: Acknowledge presence or absence of significant depression and/or stress, maximize coping skills, provide positive support system. Participant is able to verbalize types and ability to use techniques and skills needed for reducing stress and depression.   Initial Review & Psychosocial Screening:   Quality of Life Scores:   Scores of 19 and below usually indicate a poorer quality of life in these areas.  A difference of  2-3 points is a clinically meaningful difference.  A difference of 2-3 points in the total score of the Quality of Life Index has been associated with significant improvement in overall quality of life, self-image, physical symptoms, and general health in studies assessing change in quality of life.  PHQ-9: Recent Review Flowsheet Data    Depression screen [facility] 2/9 08/29/2018   Decreased Interest 0   Down, Depressed, Hopeless 0   PHQ - 2 Score 0   Altered sleeping 0   Tired, decreased energy 3   Change in appetite 0   Feeling bad or failure about yourself  0   Trouble concentrating 0   Moving slowly or fidgety/restless 0    Suicidal thoughts 0   PHQ-9 Score 3   Difficult doing work/chores Not difficult at all     Interpretation of Total Score  Total Score Depression Severity:  1-4 = Minimal depression, 5-9 = Mild depression, 10-14 = Moderate depression, 15-19 = Moderately severe depression, 20-27 = Severe depression   Psychosocial Evaluation  and Intervention:   Psychosocial Re-Evaluation: Psychosocial Re-Evaluation    [place] Name 11/08/18 1005 11/10/18 0946 11/24/18 0947 12/29/18 1135 01/26/19 1123     Psychosocial Re-Evaluation   Current issues with  Current Sleep Concerns;Current Anxiety/Panic  Current Stress Concerns;Current Anxiety/Panic;Current Sleep Concerns  Current Stress Concerns;Current Anxiety/Panic;Current Sleep Concerns;Current Depression  Current Stress Concerns;Current Anxiety/Panic;Current Sleep Concerns;Current Depression  Current Stress Concerns   Comments  Patient states he has alot on his mind. He had found his brother passed away last month. He has alot on his mind with his health and when he had his heart cardioverted. Peter Frey does not want to take medications for anxiety. He does not want them to alter what he is thinking. If he is not busy he starts thinking about his health more and dwells on the past.  Peter Frey was in the [place] ED yesterday running test.  He is also still coping with finding his brother dead and now having his own heart problems.  He is doing his best to make it through, this month has just been tough physcially and mentally.  He is still not taking his anxiety meds as he feels that he takes enough other stuff.  He continues to stay busy to keep his mind off things.  His wife is his support system and keeps an eye on him.  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 He is still not sleeping well.  He was watching TV and was asleep by 9 but woke at 1130 pm and has been up since then.  He usually will take a nap in the afternoon.  We talked about keeping  naps short and not napping after 3pm.  Peter Frey is doing well mentally overall.  He has moments of sadness, but they pass.  He does not like being limited by his health and breathing. His anxiety is doing okay and he has not found that he needs the meds yet.  He went to bed at 4am today.  He has talked to doctor about it and has CPAP, but still not sleeping.  He is not compliant with his CPAP, he only tired it once.  We talked about trying melatonin for helping get to sleep. He has tried to stop napping. We talked about establishing a bed time routine to aid in sleep.  Peter Frey is doing well mentally.  He is feeling better and really enjoys coming to class.  He is worried about our schedule shift, but still glad to be able to come some.  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  He states he is doing fine, but is nervous that his defibrillator will go off if the rhythm occurs again.   Expected Outcomes  Short: Attend HeartTrack stress management education to decrease stress. Long: Maintain exercise Post HeartTrack to keep stress at a minimum.  Short: [facility].  Long: Continue to cope well.  Short: Try to establish sleep routine and use CPAP.  Long: Continue to stay positive.  Short: Continue to exercise with closure.  Long: Continue to stay positive.  STG: Peter Frey is able to work on relaxing his thoughts about the ICD firing. Talking with staff and his physician about his concerns. Practicing deep breathing and relaxation.   LTG: Peter Frey is able to live daily without being as nervous about  the expectation of the ICD firing   Interventions  Encouraged to  attend Cardiac Rehabilitation for the exercise  Encouraged to attend Cardiac Rehabilitation for the exercise;Stress management education  --  --  Encouraged to attend Cardiac Rehabilitation for the exercise;Relaxation education   Continue Psychosocial Services   Follow up required by staff  Follow up  required by staff  --  --  Follow up required by staff     Initial Review   Source of Stress Concerns  --  Chronic Illness;Unable to perform yard/household activities;Unable to participate in former interests or hobbies  --  --  --      Psychosocial Discharge (Final Psychosocial Re-Evaluation): Psychosocial Re-Evaluation - 01/26/19 1123      Psychosocial Re-Evaluation   Current issues with  Current Stress Concerns    Comments  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  He states he is doing fine, but is nervous that his defibrillator will go off if the rhythm occurs again.    Expected Outcomes  STG: Peter Frey is able to work on relaxing his thoughts about the ICD firing. Talking with staff and his physician about his concerns. Practicing deep breathing and relaxation.   LTG: Peter Frey is able to live daily without being as nervous about  the expectation of the ICD firing    Interventions  Encouraged to attend Cardiac Rehabilitation for the exercise;Relaxation education    Continue Psychosocial Services   Follow up required by staff       Vocational Rehabilitation: Provide vocational rehab assistance to qualifying candidates.   Vocational Rehab Evaluation & Intervention:   Education: Education Goals: Education classes will be provided on a variety of topics geared toward better understanding of heart health and risk factor modification. Participant will state understanding/return demonstration of topics presented as noted by education test scores.  Learning Barriers/Preferences:   Education Topics:  AED/CPR: - Group verbal and written instruction with the use of models to demonstrate the basic use of the AED with the basic ABC's of resuscitation.   General Nutrition Guidelines/Fats and Fiber: -Group instruction provided by verbal, written material, models and posters to present the general guidelines for heart healthy nutrition. Gives an explanation  and review of dietary fats and fiber.   Cardiac Rehab from 12/15/2018 in [facility] Cardiac and Pulmonary Rehab  Date  12/01/18  Educator  [facility]  Instruction Review Code  1- Verbalizes Understanding      Controlling Sodium/Reading Food Labels: -Group verbal and written material supporting the discussion of sodium use in heart healthy nutrition. Review and explanation with models, verbal and written materials for utilization of the food label.   Exercise Physiology & General Exercise Guidelines: - Group verbal and written instruction with models to review the exercise physiology of the cardiovascular system and associated critical values. Provides general exercise guidelines with specific guidelines to those with heart or lung disease.    Aerobic Exercise & Resistance Training: - Gives group verbal and written instruction on the various components of exercise. Focuses on aerobic and resistive training programs and the benefits of this training and how to safely progress through these programs..   Cardiac Rehab from 12/15/2018 in [facility] Cardiac and Pulmonary Rehab  Date  11/17/18  Educator  jh  Instruction Review Code  1- Verbalizes Understanding      Flexibility, Balance, Mind/Body Relaxation: Provides group verbal/written instruction on the benefits of flexibility and balance training, including mind/body exercise modes such as yoga, pilates and tai chi.  Demonstration and skill practice provided.  Cardiac Rehab from 12/15/2018 in [facility] Cardiac and Pulmonary Rehab  Date  12/15/18  Educator  AS  Instruction Review Code  1- Verbalizes Understanding      Stress and Anxiety: - Provides group verbal and written instruction about the health risks of elevated stress and causes of high stress.  Discuss the correlation between heart/lung disease and anxiety and treatment options. Review healthy ways to manage with stress and anxiety.   Depression: - Provides group verbal and written instruction on  the correlation between heart/lung disease and depressed mood, treatment options, and the stigmas associated with seeking treatment.   Anatomy & Physiology of the Heart: - Group verbal and written instruction and models provide basic cardiac anatomy and physiology, with the coronary electrical and arterial systems. Review of Valvular disease and Heart Failure   Cardiac Procedures: - Group verbal and written instruction to review commonly prescribed medications for heart disease. Reviews the medication, class of the drug, and side effects. Includes the steps to properly store meds and maintain the prescription regimen. (beta blockers and nitrates)   Cardiac Rehab from 12/15/2018 in [facility] Cardiac and Pulmonary Rehab  Date  11/17/18  Educator  mc  Instruction Review Code  1- Verbalizes Understanding      Cardiac Medications I: - Group verbal and written instruction to review commonly prescribed medications for heart disease. Reviews the medication, class of the drug, and side effects. Includes the steps to properly store meds and maintain the prescription regimen.   Cardiac Medications II: -Group verbal and written instruction to review commonly prescribed medications for heart disease. Reviews the medication, class of the drug, and side effects. (all other drug classes)   Cardiac Rehab from 11/03/2018 in [facility] Cardiac and Pulmonary Rehab  Date  11/03/18  Educator  [facility]  Instruction Review Code  1- Verbalizes Understanding       Go Sex-Intimacy & Heart Disease, Get SMART - Goal Setting: - Group verbal and written instruction through game format to discuss heart disease and the return to sexual intimacy. Provides group verbal and written material to discuss and apply goal setting through the application of the S.M.A.R.T. Method.   Cardiac Rehab from 12/15/2018 in [facility] Cardiac and Pulmonary Rehab  Date  11/17/18  Educator  mc  Instruction Review Code  1- Verbalizes Understanding       Other Matters of the Heart: - Provides group verbal, written materials and models to describe Stable Angina and Peripheral Artery. Includes description of the disease process and treatment options available to the cardiac patient.   Exercise & Equipment Safety: - Individual verbal instruction and demonstration of equipment use and safety with use of the equipment.   Cardiac Rehab from 08/29/2018 in [facility] Cardiac and Pulmonary Rehab  Date  08/29/18  Educator  [facility]  Instruction Review Code  1- Verbalizes Understanding      Infection Prevention: - Provides verbal and written material to individual with discussion of infection control including proper hand washing and proper equipment cleaning during exercise session.   Cardiac Rehab from 08/29/2018 in [facility] Cardiac and Pulmonary Rehab  Date  08/29/18  Educator  [facility]  Instruction Review Code  1- Verbalizes Understanding      Falls Prevention: - Provides verbal and written material to individual with discussion of falls prevention and safety.   Cardiac Rehab from 08/29/2018 in [facility] Cardiac and Pulmonary Rehab  Date  08/29/18  Educator  [facility]  Instruction Review Code  1- Verbalizes Understanding  Diabetes: - Individual verbal and written instruction to review signs/symptoms of diabetes, desired ranges of glucose level fasting, after meals and with exercise. Acknowledge that pre and post exercise glucose checks will be done for 3 sessions at entry of program.   Know Your Numbers and Risk Factors: -Group verbal and written instruction about important numbers in your health.  Discussion of what are risk factors and how they play a role in the disease process.  Review of Cholesterol, Blood Pressure, Diabetes, and BMI and the role they play in your overall health.   Cardiac Rehab from 11/03/2018 in [facility] Cardiac and Pulmonary Rehab  Date  11/03/18  Educator  [facility]  Instruction Review Code  1- Verbalizes Understanding      Sleep  Hygiene: -Provides group verbal and written instruction about how sleep can affect your health.  Define sleep hygiene, discuss sleep cycles and impact of sleep habits. Review good sleep hygiene tips.    Other: -Provides group and verbal instruction on various topics (see comments)   Knowledge Questionnaire Score:   Core Components/Risk Factors/Patient Goals at Admission:   Core Components/Risk Factors/Patient Goals Review:  Goals and Risk Factor Review    Row Name 10/06/18 1019 11/08/18 1010 11/10/18 0951 11/24/18 0944 12/29/18 1138     Core Components/Risk Factors/Patient Goals Review   Personal Goals Review  Hypertension;Lipids;Other  Weight Management/Obesity;Improve shortness of breath with ADL's;Diabetes;Hypertension;Lipids  Weight Management/Obesity;Improve shortness of breath with ADL's;Diabetes;Hypertension;Lipids  Weight Management/Obesity;Improve shortness of breath with ADL's;Diabetes;Hypertension;Lipids  Weight Management/Obesity;Improve shortness of breath with ADL's;Diabetes;Hypertension;Lipids   Review  Peter Frey is taking all meds as directed.  He is trying to eat more vegetables and less fried food.  He is walking some outside class.He does check BP at home and it stays stable.  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 His blood pressure has been good but was slightly low today. His lipids have been good. He takes his cholesterol pill at night before bed.  Peter Frey has been doing well in rehab.  His weight is down some overall.  He has been compliant with his medications.  His pressures have been good in class but he does not check it at one.  We talked about getting cuff out and taking it at home.  Blood sugars are getting better.  His A1c was down to 6.2 yesterday at the [facility]!!  He is still having some SOB and has not noticed improvement yet.  Peter Frey is doing well.  They cut his losartan in half and he feels much better.  His blood  pressure is no longer dropping when he checks it at home.  A1c is 6.2 and he is no longer taking anything for it.  His SOB is still linger but it is getting better. We talked using PLB to help manage his SOB.  His weight is rock solid steady!!  Peter Frey is doing well.  His weight is holding steady.  Blood pressures and blood sugars have done well and he continues to monitor them at home.  He is doing better with his SOB.   Expected Outcomes  Short - continue heart healthy changes Long - maintain heart healthy habits  Short: Attend [facility] regularly to improve shortness of breath with ADL's. Long: maintain independence with ADL's  Short: Get blood pressure cuff out and start checking at home.  Long: Continue to work on weight loss.  Short: Use PLB to manage breathing.  Long: Continue  to manage risk factors.  Short: Continue to work on breathing.  Long: Continue to monitor risk factors.      Core Components/Risk Factors/Patient Goals at Discharge (Final Review):  Goals and Risk Factor Review - 12/29/18 1138      Core Components/Risk Factors/Patient Goals Review   Personal Goals Review  Weight Management/Obesity;Improve shortness of breath with ADL's;Diabetes;Hypertension;Lipids    Review  Peter Frey is doing well.  His weight is holding steady.  Blood pressures and blood sugars have done well and he continues to monitor them at home.  He is doing better with his SOB.    Expected Outcomes  Short: Continue to work on breathing.  Long: Continue to monitor risk factors.       ITP Comments: ITP Comments    Row Name 09/13/18 1107 09/21/18 0600 10/19/18 1333 10/25/18 1044 11/01/18 1117   ITP Comments  Returning call to pt. He called to leave [place] a message that he has cough and congestion.  They had a COVID test done and it was negative.  He is taking some medicine now to feel better.  Encouraged him to stay home until he is symptom free for 48 hours.  30 Day review. Continue with ITP unless directed changes per  Medical Director review.  Still out recuperating from cough/congestion  30 day review completed. ITP sent to Dr. Emily Filbert, Medical Director of Cardiac and Pulmonary Rehab. Continue with ITP unless changes are made by physician.  Department closed starting 10/2 until further notice by infection prevention and Health at Work teams for [place].  Called to check on pt.  He was admitted to hospital for vtach with two shocks.   He has a follow up on Friday with his cardiologist.  He is feeling better. He hopes to return next week.  Peter Frey returned to program after being out several weeks for [employer]. His ICD was readjusted to HR of 180.  HIs doctors at the [place] told him to resume his rehab   [place] Name 11/10/18 0945 11/16/18 0623 12/14/18 9628 01/11/19 0833 01/26/19 1123   ITP Comments  Peter Frey was in [place] ED yesterday per their order running test.  They released him home.  30 day review completed. Continue with ITP sent to Dr. Emily Filbert, Medical Director of Cardiac and Pulmonary Rehab for review , changes as needed and signature.  30 day review competed . ITP sent to Dr Emily Filbert for review, changes as needed and ITP approval signature.  30 day review competed . ITP sent to Dr Emily Filbert for review, changes as needed and ITP approval signature  Virtual call completed today. Calls are done every week that patient is not attending an onsite exercise session during the COVID 19 PHE Crisis.   Today's call included review of :a psychosocial review and his exercise routine at home. Peter Frey has been relaxing his exercise routine this week after an ablation that was completed last week.  He stated he is still nervous to do too much since the ablation: not knowing if ICD will go off and shock him. He has an appointment today with a clinic and will address a note to return to exercise with his physician.   Follow up call is scheduled for 02/09/2019 930AM   Row Name 02/08/19 0949 03/08/19 0615         ITP Comments  30 day review  completed. ITP sent to Dr. Emily Filbert, Medical Director of Cardiac and Pulmonary Rehab. Continue with ITP unless  changes are made by physician.  Department operating under reduced schedule until further notice by request from hospital leadership.  30 day chart review completed. ITP sent to Dr Zachery Dakins Medical Director, for review,changes as needed and signature. Peter Frey is out for medical reasons         Comments:

## 2019-03-27 ENCOUNTER — Encounter: Payer: Self-pay | Admitting: *Deleted

## 2019-03-27 DIAGNOSIS — I429 Cardiomyopathy, unspecified: Secondary | ICD-10-CM

## 2019-03-27 NOTE — Progress Notes (Signed)
Cardiac Individual Treatment Plan  Patient Details  Name: Peter Frey MRN: [id number] Date of Birth: 02-26-1950 Referring Provider:     Cardiac Rehab from 08/29/2018 in [facility] Cardiac and Pulmonary Rehab  Referring Provider  Nicholes Mango MD      Initial Encounter Date:    Cardiac Rehab from 08/29/2018 in [facility] Cardiac and Pulmonary Rehab  Date  08/29/18      Visit Diagnosis: Cardiomyopathy, unspecified type ([place])  Patient's Home Medications on Admission:  Current Outpatient Medications:  .  acetaminophen (TYLENOL) 500 MG tablet, Take 500 mg by mouth every 6 (six) hours as needed., Disp: , Rfl:  .  albuterol (VENTOLIN HFA) 108 (90 Base) MCG/ACT inhaler, Inhale into the lungs., Disp: , Rfl:  .  aspirin 81 MG tablet, Take 81 mg by mouth daily., Disp: , Rfl:  .  furosemide (LASIX) 40 MG tablet, Take 40 mg by mouth daily. , Disp: , Rfl:  .  gabapentin (NEURONTIN) 300 MG capsule, Take 300 mg by mouth 3 (three) times daily., Disp: , Rfl:  .  losartan (COZAAR) 25 MG tablet, Take 25 mg by mouth daily. , Disp: , Rfl:  .  metoprolol (LOPRESSOR) 50 MG tablet, Take 50 mg by mouth 2 (two) times daily., Disp: , Rfl:  .  sildenafil (VIAGRA) 50 MG tablet, Take by mouth as needed. , Disp: , Rfl:  .  simvastatin (ZOCOR) 20 MG tablet, Take 20 mg by mouth daily., Disp: , Rfl:  .  spironolactone (ALDACTONE) 25 MG tablet, Take 25 mg by mouth daily. , Disp: , Rfl:  .  TIOTROPIUM BROMIDE-OLODATEROL IN, Inhale 2 Inhalers into the lungs daily., Disp: , Rfl:   Past Medical History: Past Medical History:  Diagnosis Date  . CHF (congestive heart failure) ([place])   . COPD (chronic obstructive pulmonary disease) ([place])   . Coronary artery disease   . MI (myocardial infarction) ([place])     Tobacco Use: Social History   Tobacco Use  Smoking Status Former Smoker  . Packs/day: 2.00  . Years: 43.00  . Pack years: 86.00  . Types: Cigarettes  . Quit date: 2015  . Years since quitting: 6.2  Smokeless Tobacco  Never Used    Labs: Recent Review [facility] for [facility] Cardiac and Pulmonary Rehab Latest Ref Rng & Units 10/18/2018   Cholestrol 0 - 200 mg/dL 94   LDLCALC 0 - 99 mg/dL 29   HDL >40 mg/dL 50   Trlycerides <150 mg/dL 73       Exercise Target Goals: Exercise Program Goal: Individual exercise prescription set using results from initial 6 min walk test and THRR while considering  patient's activity barriers and safety.   Exercise Prescription Goal: Initial exercise prescription builds to 30-45 minutes a day of aerobic activity, 2-3 days per week.  Home exercise guidelines will be given to patient during program as part of exercise prescription that the participant will acknowledge.  Activity Barriers & Risk Stratification:   6 Minute Walk:   Oxygen Initial Assessment:   Oxygen Re-Evaluation:   Oxygen Discharge (Final Oxygen Re-Evaluation):   Initial Exercise Prescription:   Perform Capillary Blood Glucose checks as needed.  Exercise Prescription Changes: Exercise Prescription Changes    Row Name 10/06/18 1000 10/25/18 1000 11/03/18 1600 11/16/18 1300 12/02/18 1200     Response to Exercise   Blood Pressure (Admit)  --  122/64  110/70  122/56  110/64   Blood Pressure (Exercise)  --  124/64  138/62  120/70  128/76   Blood Pressure (Exit)  --  108/56  100/62  116/66  102/60   Heart Rate (Admit)  --  70 bpm  68 bpm  76 bpm  82 bpm   Heart Rate (Exercise)  --  95 bpm  106 bpm  108 bpm  104 bpm   Heart Rate (Exit)  --  76 bpm  67 bpm  80 bpm  68 bpm   Rating of Perceived Exertion (Exercise)  --  _0 Symptoms  --  none  none  none  none   Duration  --  Continue with 30 min of aerobic exercise without signs/symptoms of physical distress.  Continue with 30 min of aerobic exercise without signs/symptoms of physical distress.  Continue with 30 min of aerobic exercise without signs/symptoms of physical distress.  Continue with 30 min of aerobic exercise  without signs/symptoms of physical distress.   Intensity  --  THRR unchanged  THRR unchanged  THRR unchanged  THRR unchanged     Progression   Progression  --  Continue to progress workloads to maintain intensity without signs/symptoms of physical distress.  Continue to progress workloads to maintain intensity without signs/symptoms of physical distress.  Continue to progress workloads to maintain intensity without signs/symptoms of physical distress.  Continue to progress workloads to maintain intensity without signs/symptoms of physical distress.   Average METs  --  2.68  2.5  2.91  2.6     Resistance Training   Training Prescription  --  Yes  Yes  Yes  Yes   Weight  --  4 lbs  4 lb  4 lbs  5 lb   Reps  --  10-15  10-15  10-15  10-15     Interval Training   Interval Training  --  No  No  No  No     Treadmill   MPH  --  2._1 Grade  --  0  0  0  0   Minutes  --  _2 METs  --  2.91  2.53  2.53  2.53     Recumbant Bike   Level  --  --  --  3  --   Minutes  --  --  --  15  --   METs  --  --  --  3.2  --     NuStep   Level  --  _3 Minutes  --  _4 METs  --  2.7  2.3 T5 - alternate station  3  2.8     Recumbant Elliptical   Level  --  2.5  --  --  4   Minutes  --  15  --  --  15   METs  --  2.2  --  --  2.2     Home Exercise Plan   Plans to continue exercise at  Home (comment) considering gym  Home (comment) considering gym  Home (comment) considering gym  Home (comment) considering gym  Home (comment) considering gym   Frequency  Add 2 additional days to program exercise sessions.  Add 2 additional days to program exercise sessions.  Add 2 additional days to program exercise sessions.  Add 2 additional days to program exercise  sessions.  Add 2 additional days to program exercise sessions.   Initial Home Exercises Provided  10/06/18  10/06/18  10/06/18  10/06/18  10/06/18   Row Name 12/15/18 1000 12/30/18 1300 01/12/19 1000          Response to Exercise   Blood Pressure (Admit)  112/64  124/64  126/64     Blood Pressure (Exercise)  120/60  126/66  128/64     Blood Pressure (Exit)  100/60  124/60  116/64     Heart Rate (Admit)  70 bpm  58 bpm  73 bpm     Heart Rate (Exercise)  107 bpm  84 bpm  99 bpm     Heart Rate (Exit)  73 bpm  66 bpm  67 bpm     Rating of Perceived Exertion (Exercise)  _0 Symptoms  none  none  none     Duration  Continue with 30 min of aerobic exercise without signs/symptoms of physical distress.  Continue with 30 min of aerobic exercise without signs/symptoms of physical distress.  Continue with 30 min of aerobic exercise without signs/symptoms of physical distress.     Intensity  THRR unchanged  THRR unchanged  THRR unchanged       Progression   Progression  Continue to progress workloads to maintain intensity without signs/symptoms of physical distress.  Continue to progress workloads to maintain intensity without signs/symptoms of physical distress.  Continue to progress workloads to maintain intensity without signs/symptoms of physical distress.     Average METs  2.77  2.5  2.6       Resistance Training   Training Prescription  Yes  Yes  Yes     Weight  5 lb  5 lb  5 lb     Reps  10-15  10-15  10-15       Interval Training   Interval Training  No  No  No       Recumbant Bike   Level  3  --  3     RPM  --  --  60     Minutes  15  --  15     METs  3.3  --  2.9       NuStep   Level  _1 Minutes  _2 METs  2.8  3  2.3       Recumbant Elliptical   Level  4  2  --     Minutes  15  15  --     METs  2.2  2  --       Home Exercise Plan   Plans to continue exercise at  Home (comment) considering gym  Home (comment) considering gym  Home (comment) considering gym     Frequency  Add 2 additional days to program exercise sessions.  Add 2 additional days to program exercise sessions.  Add 2 additional days to program exercise sessions.     Initial Home  Exercises Provided  10/06/18  10/06/18  10/06/18        Exercise Comments: Exercise Comments    Row Name 01/26/19 1123           Exercise Comments  Virtual call completed today. Calls are done every week that patient is not attending an onsite exercise session during the COVID 19 PHE Crisis.  Today's call included review of :a psychosocial review and his exercise routine at home. Peter Frey has been relaxing his exercise routine this week after an ablation that was completed last week.  He stated he is still nervous to do too much since the ablation: not knowing if ICD will go off and shock him. He has an appointment today with a clinic and will address a note to return to exercise with his physician.   Follow up call is scheduled for 02/09/2019 930AM          Exercise Goals and Review:   Exercise Goals Re-Evaluation : Exercise Goals Re-Evaluation    Row Name 10/25/18 1044 11/03/18 1614 11/10/18 0955 11/16/18 1336 11/24/18 0941     Exercise Goal Re-Evaluation   Exercise Goals Review  --  Increase Physical Activity;Increase Strength and Stamina;Able to understand and use rate of perceived exertion (RPE) scale;Knowledge and understanding of Target Heart Rate Range (THRR);Able to check pulse independently;Understanding of Exercise Prescription  Increase Physical Activity;Increase Strength and Stamina;Understanding of Exercise Prescription  Increase Physical Activity;Increase Strength and Stamina;Understanding of Exercise Prescription  Increase Physical Activity;Increase Strength and Stamina;Understanding of Exercise Prescription   Comments  Peter Frey has been out since 9/24.  This is Davids first week back in almost a month.  He is building  back to previous levels.  Peter Frey has been doing well in rehab.  He has not done much at home this month with everything that is going on.  He is still having some SOB.  He has not noticed an improvement with strength and stamina yet.  He is able to do what he wants  but modifies it some.  Peter Frey continues to do well in rehab.  He has been having some issues with his blood pressures at home.  He is on level 4 on the NuStep.  We will continue to monitor his progress.  Peter Frey is doing well in rehab.  We are trying increase his mobility on the treadmill.  We talked about doing the SET program after graduation.  He has not been doing his home exercise.  He is going to try to get back to it.  He usually walks at home but since his medical scare he backed off.  He is feeling better now.   Expected Outcomes  --  Short - get back on track consistently Long - increase MET level  Short: Get back to exercising on off days.  Long: Continue to improve stamina.  Short: Add incline on treadmill  Long: Continue increase activity levels.  Short: Get back to walking at home.  Long: Talk to [place] about PAD SET program.   Row Name 12/02/18 1223 12/15/18 1020 12/29/18 1132 12/30/18 1336 01/12/19 1045     Exercise Goal Re-Evaluation   Exercise Goals Review  Increase Physical Activity;Increase Strength and Stamina;Able to understand and use rate of perceived exertion (RPE) scale;Able to understand and use Dyspnea scale;Knowledge and understanding of Target Heart Rate Range (THRR);Able to check pulse independently;Understanding of Exercise Prescription  Increase Physical Activity;Increase Strength and Stamina;Understanding of Exercise Prescription  Increase Physical Activity;Increase Strength and Stamina;Understanding of Exercise Prescription  Increase Physical Activity;Increase Strength and Stamina;Able to understand and use rate of perceived exertion (RPE) scale;Knowledge and understanding of Target Heart Rate Range (THRR);Able to check pulse independently;Understanding of Exercise Prescription  Increase Physical Activity;Increase Strength and Stamina;Able to understand and use rate of perceived exertion (RPE) scale;Able to understand and use Dyspnea scale;Knowledge and understanding of Target Heart  Rate Range (  THRR);Able to check pulse independently;Understanding of Exercise Prescription   Comments  Peter Frey continues to do well.  He attends consistently and works at proper RPE when he is here.  Peter Frey continues to do well in rehab.  He is on level 4 for NuStep and getting 3.3 METs for bike.  We will continue to monitor his progress.  Peter Frey continues to do well in rehab.  He has been walking some at home.  He is building back up.  He is feeling stronger and has more stamina.  Peter Frey has tried the TM and elliptical.  He had some claudication pain on the TM and elliptical was challenging for him.  Peter Frey continues to do well in rehab.  Staff will monitor progress.   Expected Outcomes  Short - add walking outside HT sessions Long : maintain exercise  Short: Talk about adding in intervals. Long: Continue to improve stamina.  Short: Continue to build exercise back up at home. Long: Continue to improve stamina.  Short - continue to try TM and elliptical  Long : build overall stamina  Short - try Tm and elliptical nest sessions Long - continue to build stamina   Row Name 03/08/19 1336             Exercise Goal Re-Evaluation   Comments  Out since last review.          Discharge Exercise Prescription (Final Exercise Prescription Changes): Exercise Prescription Changes - 01/12/19 1000      Response to Exercise   Blood Pressure (Admit)  126/64    Blood Pressure (Exercise)  128/64    Blood Pressure (Exit)  116/64    Heart Rate (Admit)  73 bpm    Heart Rate (Exercise)  99 bpm    Heart Rate (Exit)  67 bpm    Rating of Perceived Exertion (Exercise)  15    Symptoms  none    Duration  Continue with 30 min of aerobic exercise without signs/symptoms of physical distress.    Intensity  THRR unchanged      Progression   Progression  Continue to progress workloads to maintain intensity without signs/symptoms of physical distress.    Average METs  2.6      Resistance Training   Training Prescription  Yes     Weight  5 lb    Reps  10-15      Interval Training   Interval Training  No      Recumbant Bike   Level  3    RPM  60    Minutes  15    METs  2.9      NuStep   Level  5    Minutes  15    METs  2.3      Home Exercise Plan   Plans to continue exercise at  Home (comment)   considering gym   Frequency  Add 2 additional days to program exercise sessions.    Initial Home Exercises Provided  10/06/18       Nutrition:  Target Goals: Understanding of nutrition guidelines, daily intake of sodium <1520m, cholesterol <2056m calories 30% from fat and 7% or less from saturated fats, daily to have 5 or more servings of fruits and vegetables.  Biometrics:    Nutrition Therapy Plan and Nutrition Goals:   Nutrition Assessments:   Nutrition Goals Re-Evaluation: Nutrition Goals Re-Evaluation    Row Name 10/04/18 1016 11/08/18 1015 11/15/18 1138 12/06/18 1012 12/29/18 1007     Goals  Current Weight  --  169 lb (76.7 kg)  --  --  --   Nutrition Goal  ST: when pt does not have breakfast, have mid-morning protein snack LT: gain energy,  reduce SOB  Maintain weight. Drink more water.  Maintain weight. Drink more water.  ST: continue with curent diet LT: get back to baseline  ST: continue with curent diet LT: get back to baseline   Comment  Pt reports eating the same way since the last time we spoke, discussed the importance of getting enough protein and mechanical eating when he is not hungry.  Patient states that he does not drink much water. He has at least 3 regular [employer] a day. He does not check his blood sugar at home. He is eating more baked goods and intaking more vegetables. Spokes to patient about his low blood pressure and informed him that he may need to drink more water.  Continue with current changes  Pt reports drinking more water, currently no sodas in the house. Pt reports no new changes in diet and would like to continue with current diet.  Pt reports no new changes  in diet and would like to continue with current diet.   Expected Outcome  ST: when pt does not have breakfast, have mid-morning protein snack LT: gain energy,  reduce SOB  Short:cut back on mountain dew and drink water. Long: maintain drinking water and drink mountain dew seldome.  Short:cut back on mountain dew and drink water. Long: maintain drinking water and drink mountain dew seldome.  ST: continue with curent diet LT: get back to baseline  ST: continue with curent diet LT: get back to baseline      Nutrition Goals Discharge (Final Nutrition Goals Re-Evaluation): Nutrition Goals Re-Evaluation - 12/29/18 1007      Goals   Nutrition Goal  ST: continue with curent diet LT: get back to baseline    Comment  Pt reports no new changes in diet and would like to continue with current diet.    Expected Outcome  ST: continue with curent diet LT: get back to baseline       Psychosocial: Target Goals: Acknowledge presence or absence of significant depression and/or stress, maximize coping skills, provide positive support system. Participant is able to verbalize types and ability to use techniques and skills needed for reducing stress and depression.   Initial Review & Psychosocial Screening:   Quality of Life Scores:   Scores of 19 and below usually indicate a poorer quality of life in these areas.  A difference of  2-3 points is a clinically meaningful difference.  A difference of 2-3 points in the total score of the Quality of Life Index has been associated with significant improvement in overall quality of life, self-image, physical symptoms, and general health in studies assessing change in quality of life.  PHQ-9: Recent Review Flowsheet Data    Depression screen [facility] 2/9 08/29/2018   Decreased Interest 0   Down, Depressed, Hopeless 0   PHQ - 2 Score 0   Altered sleeping 0   Tired, decreased energy 3   Change in appetite 0   Feeling bad or failure about yourself  0   Trouble  concentrating 0   Moving slowly or fidgety/restless 0   Suicidal thoughts 0   PHQ-9 Score 3   Difficult doing work/chores Not difficult at all     Interpretation of Total Score  Total Score Depression Severity:  1-4 = Minimal depression,  5-9 = Mild depression, 10-14 = Moderate depression, 15-19 = Moderately severe depression, 20-27 = Severe depression   Psychosocial Evaluation and Intervention:   Psychosocial Re-Evaluation: Psychosocial Re-Evaluation    Row Name 11/08/18 1005 11/10/18 0946 11/24/18 0947 12/29/18 1135 01/26/19 1123     Psychosocial Re-Evaluation   Current issues with  Current Sleep Concerns;Current Anxiety/Panic  Current Stress Concerns;Current Anxiety/Panic;Current Sleep Concerns  Current Stress Concerns;Current Anxiety/Panic;Current Sleep Concerns;Current Depression  Current Stress Concerns;Current Anxiety/Panic;Current Sleep Concerns;Current Depression  Current Stress Concerns   Comments  Patient states he has alot on his mind. He had found his brother passed away last month. He has alot on his mind with his health and when he had his heart cardioverted. Marcelis does not want to take medications for anxiety. He does not want them to alter what he is thinking. If he is not busy he starts thinking about his health more and dwells on the past.  Peter Frey was in the [place] ED yesterday running test.  He is also still coping with finding his brother dead and now having his own heart problems.  He is doing his best to make it through, this month has just been tough physcially and mentally.  He is still not taking his anxiety meds as he feels that he takes enough other stuff.  He continues to stay busy to keep his mind off things.  His wife is his support system and keeps an eye on him.  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 He is still not sleeping well.  He was watching TV and was asleep by 9 but woke at 1130 pm and has been up since then.  He usually  will take a nap in the afternoon.  We talked about keeping naps short and not napping after 3pm.  Peter Frey is doing well mentally overall.  He has moments of sadness, but they pass.  He does not like being limited by his health and breathing. His anxiety is doing okay and he has not found that he needs the meds yet.  He went to bed at 4am today.  He has talked to doctor about it and has CPAP, but still not sleeping.  He is not compliant with his CPAP, he only tired it once.  We talked about trying melatonin for helping get to sleep. He has tried to stop napping. We talked about establishing a bed time routine to aid in sleep.  Peter Frey is doing well mentally.  He is feeling better and really enjoys coming to class.  He is worried about our schedule shift, but still glad to be able to come some.  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  He states he is doing fine, but is nervous that his defibrillator will go off if the rhythm occurs again.   Expected Outcomes  Short: Attend HeartTrack stress management education to decrease stress. Long: Maintain exercise Post HeartTrack to keep stress at a minimum.  Short: [facility].  Long: Continue to cope well.  Short: Try to establish sleep routine and use CPAP.  Long: Continue to stay positive.  Short: Continue to exercise with closure.  Long: Continue to stay positive.  STG: Peter Frey is able to work on relaxing his thoughts about the ICD firing. Talking with staff and his physician about his concerns. Practicing deep breathing and relaxation.   LTG: Peter Frey is able  to live daily without being as nervous about  the expectation of the ICD firing   Interventions  Encouraged to attend Cardiac Rehabilitation for the exercise  Encouraged to attend Cardiac Rehabilitation for the exercise;Stress management education  --  --  Encouraged to attend Cardiac Rehabilitation for the exercise;Relaxation education   Continue  Psychosocial Services   Follow up required by staff  Follow up required by staff  --  --  Follow up required by staff     Initial Review   Source of Stress Concerns  --  Chronic Illness;Unable to perform yard/household activities;Unable to participate in former interests or hobbies  --  --  --      Psychosocial Discharge (Final Psychosocial Re-Evaluation): Psychosocial Re-Evaluation - 01/26/19 1123      Psychosocial Re-Evaluation   Current issues with  Current Stress Concerns    Comments  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  He states he is doing fine, but is nervous that his defibrillator will go off if the rhythm occurs again.    Expected Outcomes  STG: Peter Frey is able to work on relaxing his thoughts about the ICD firing. Talking with staff and his physician about his concerns. Practicing deep breathing and relaxation.   LTG: Peter Frey is able to live daily without being as nervous about  the expectation of the ICD firing    Interventions  Encouraged to attend Cardiac Rehabilitation for the exercise;Relaxation education    Continue Psychosocial Services   Follow up required by staff       Vocational Rehabilitation: Provide vocational rehab assistance to qualifying candidates.   Vocational Rehab Evaluation & Intervention:   Education: Education Goals: Education classes will be provided on a variety of topics geared toward better understanding of heart health and risk factor modification. Participant will state understanding/return demonstration of topics presented as noted by education test scores.  Learning Barriers/Preferences:   Education Topics:  AED/CPR: - Group verbal and written instruction with the use of models to demonstrate the basic use of the AED with the basic ABC's of resuscitation.   General Nutrition Guidelines/Fats and Fiber: -Group instruction provided by verbal, written material, models and posters to present the general  guidelines for heart healthy nutrition. Gives an explanation and review of dietary fats and fiber.   Cardiac Rehab from 12/15/2018 in [facility] Cardiac and Pulmonary Rehab  Date  12/01/18  Educator  [facility]  Instruction Review Code  1- Verbalizes Understanding      Controlling Sodium/Reading Food Labels: -Group verbal and written material supporting the discussion of sodium use in heart healthy nutrition. Review and explanation with models, verbal and written materials for utilization of the food label.   Exercise Physiology & General Exercise Guidelines: - Group verbal and written instruction with models to review the exercise physiology of the cardiovascular system and associated critical values. Provides general exercise guidelines with specific guidelines to those with heart or lung disease.    Aerobic Exercise & Resistance Training: - Gives group verbal and written instruction on the various components of exercise. Focuses on aerobic and resistive training programs and the benefits of this training and how to safely progress through these programs..   Cardiac Rehab from 12/15/2018 in [facility] Cardiac and Pulmonary Rehab  Date  11/17/18  Educator  jh  Instruction Review Code  1- Verbalizes Understanding      Flexibility, Balance, Mind/Body Relaxation: Provides group verbal/written instruction on the benefits of flexibility and  balance training, including mind/body exercise modes such as yoga, pilates and tai chi.  Demonstration and skill practice provided.   Cardiac Rehab from 12/15/2018 in [facility] Cardiac and Pulmonary Rehab  Date  12/15/18  Educator  AS  Instruction Review Code  1- Verbalizes Understanding      Stress and Anxiety: - Provides group verbal and written instruction about the health risks of elevated stress and causes of high stress.  Discuss the correlation between heart/lung disease and anxiety and treatment options. Review healthy ways to manage with stress and  anxiety.   Depression: - Provides group verbal and written instruction on the correlation between heart/lung disease and depressed mood, treatment options, and the stigmas associated with seeking treatment.   Anatomy & Physiology of the Heart: - Group verbal and written instruction and models provide basic cardiac anatomy and physiology, with the coronary electrical and arterial systems. Review of Valvular disease and Heart Failure   Cardiac Procedures: - Group verbal and written instruction to review commonly prescribed medications for heart disease. Reviews the medication, class of the drug, and side effects. Includes the steps to properly store meds and maintain the prescription regimen. (beta blockers and nitrates)   Cardiac Rehab from 12/15/2018 in [facility] Cardiac and Pulmonary Rehab  Date  11/17/18  Educator  mc  Instruction Review Code  1- Verbalizes Understanding      Cardiac Medications I: - Group verbal and written instruction to review commonly prescribed medications for heart disease. Reviews the medication, class of the drug, and side effects. Includes the steps to properly store meds and maintain the prescription regimen.   Cardiac Medications II: -Group verbal and written instruction to review commonly prescribed medications for heart disease. Reviews the medication, class of the drug, and side effects. (all other drug classes)   Cardiac Rehab from 11/03/2018 in [facility] Cardiac and Pulmonary Rehab  Date  11/03/18  Educator  [facility]  Instruction Review Code  1- Verbalizes Understanding       Go Sex-Intimacy & Heart Disease, Get SMART - Goal Setting: - Group verbal and written instruction through game format to discuss heart disease and the return to sexual intimacy. Provides group verbal and written material to discuss and apply goal setting through the application of the S.M.A.R.T. Method.   Cardiac Rehab from 12/15/2018 in [facility] Cardiac and Pulmonary Rehab  Date  11/17/18   Educator  mc  Instruction Review Code  1- Verbalizes Understanding      Other Matters of the Heart: - Provides group verbal, written materials and models to describe Stable Angina and Peripheral Artery. Includes description of the disease process and treatment options available to the cardiac patient.   Exercise & Equipment Safety: - Individual verbal instruction and demonstration of equipment use and safety with use of the equipment.   Cardiac Rehab from 08/29/2018 in [facility] Cardiac and Pulmonary Rehab  Date  08/29/18  Educator  [facility]  Instruction Review Code  1- Verbalizes Understanding      Infection Prevention: - Provides verbal and written material to individual with discussion of infection control including proper hand washing and proper equipment cleaning during exercise session.   Cardiac Rehab from 08/29/2018 in [facility] Cardiac and Pulmonary Rehab  Date  08/29/18  Educator  [facility]  Instruction Review Code  1- Verbalizes Understanding      Falls Prevention: - Provides verbal and written material to individual with discussion of falls prevention and safety.   Cardiac Rehab from 08/29/2018 in [facility] Cardiac and Pulmonary  Rehab  Date  08/29/18  Educator  [facility]  Instruction Review Code  1- Verbalizes Understanding      Diabetes: - Individual verbal and written instruction to review signs/symptoms of diabetes, desired ranges of glucose level fasting, after meals and with exercise. Acknowledge that pre and post exercise glucose checks will be done for 3 sessions at entry of program.   Know Your Numbers and Risk Factors: -Group verbal and written instruction about important numbers in your health.  Discussion of what are risk factors and how they play a role in the disease process.  Review of Cholesterol, Blood Pressure, Diabetes, and BMI and the role they play in your overall health.   Cardiac Rehab from 11/03/2018 in [facility] Cardiac and Pulmonary Rehab  Date  11/03/18  Educator  [facility]   Instruction Review Code  1- Verbalizes Understanding      Sleep Hygiene: -Provides group verbal and written instruction about how sleep can affect your health.  Define sleep hygiene, discuss sleep cycles and impact of sleep habits. Review good sleep hygiene tips.    Other: -Provides group and verbal instruction on various topics (see comments)   Knowledge Questionnaire Score:   Core Components/Risk Factors/Patient Goals at Admission:   Core Components/Risk Factors/Patient Goals Review:  Goals and Risk Factor Review    Row Name 10/06/18 1019 11/08/18 1010 11/10/18 0951 11/24/18 0944 12/29/18 1138     Core Components/Risk Factors/Patient Goals Review   Personal Goals Review  Hypertension;Lipids;Other  Weight Management/Obesity;Improve shortness of breath with ADL's;Diabetes;Hypertension;Lipids  Weight Management/Obesity;Improve shortness of breath with ADL's;Diabetes;Hypertension;Lipids  Weight Management/Obesity;Improve shortness of breath with ADL's;Diabetes;Hypertension;Lipids  Weight Management/Obesity;Improve shortness of breath with ADL's;Diabetes;Hypertension;Lipids   Review  Peter Frey is taking all meds as directed.  He is trying to eat more vegetables and less fried food.  He is walking some outside class.He does check BP at home and it stays stable.  Jarmar feels like he gets short of breath more now than he was after his first heart attack. His blood pressure has been good but was slightly low today. His lipids have been good. He takes his cholesterol pill at night before bed.  Peter Frey has been doing well in rehab.  His weight is down some overall.  He has been compliant with his medications.  His pressures have been good in class but he does not check it at one.  We talked about getting cuff out and taking it at home.  Blood sugars are getting better.  His A1c was down to 6.2 yesterday at the [facility]!!  He is still having some SOB and has not noticed improvement yet.  Peter Frey is doing well.   They cut his losartan in half and he feels much better.  His blood pressure is no longer dropping when he checks it at home.  A1c is 6.2 and he is no longer taking anything for it.  His SOB is still linger but it is getting better. We talked using PLB to help manage his SOB.  His weight is rock solid steady!!  Peter Frey is doing well.  His weight is holding steady.  Blood pressures and blood sugars have done well and he continues to monitor them at home.  He is doing better with his SOB.   Expected Outcomes  Short - continue heart healthy changes Long - maintain heart healthy habits  Short: Attend HeartTrack regularly to improve shortness of breath with ADL's. Long: maintain independence with ADL's  Short: Get blood pressure cuff out and  start checking at home.  Long: Continue to work on weight loss.  Short: Use PLB to manage breathing.  Long: Continue to manage risk factors.  Short: Continue to work on breathing.  Long: Continue to monitor risk factors.      Core Components/Risk Factors/Patient Goals at Discharge (Final Review):  Goals and Risk Factor Review - 12/29/18 1138      Core Components/Risk Factors/Patient Goals Review   Personal Goals Review  Weight Management/Obesity;Improve shortness of breath with ADL's;Diabetes;Hypertension;Lipids    Review  Peter Frey is doing well.  His weight is holding steady.  Blood pressures and blood sugars have done well and he continues to monitor them at home.  He is doing better with his SOB.    Expected Outcomes  Short: Continue to work on breathing.  Long: Continue to monitor risk factors.       ITP Comments: ITP Comments    Row Name 10/19/18 1333 10/25/18 1044 11/01/18 1117 11/10/18 0945 11/16/18 0623   ITP Comments  30 day review completed. ITP sent to Dr. Emily Filbert, Medical Director of Cardiac and Pulmonary Rehab. Continue with ITP unless changes are made by physician.  Department closed starting 10/2 until further notice by infection prevention and Health  at Work teams for [place].  Called to check on pt.  He was admitted to hospital for vtach with two shocks.   He has a follow up on Friday with his cardiologist.  He is feeling better. He hopes to return next week.  Peter Frey returned to program after being out several weeks for [employer]. His ICD was readjusted to HR of 180.  HIs doctors at the [place] told him to resume his rehab  Peter Frey was in [place] ED yesterday per their order running test.  They released him home.  30 day review completed. Continue with ITP sent to Dr. Emily Filbert, Medical Director of Cardiac and Pulmonary Rehab for review , changes as needed and signature.   [place] Name 12/14/18 [phone] 01/11/19 9458 01/26/19 1123 02/08/19 0949 03/08/19 0615   ITP Comments  30 day review competed . ITP sent to Dr Emily Filbert for review, changes as needed and ITP approval signature.  30 day review competed . ITP sent to Dr Emily Filbert for review, changes as needed and ITP approval signature  Virtual call completed today. Calls are done every week that patient is not attending an onsite exercise session during the COVID 19 PHE Crisis.   Today's call included review of :a psychosocial review and his exercise routine at home. Peter Frey has been relaxing his exercise routine this week after an ablation that was completed last week.  He stated he is still nervous to do too much since the ablation: not knowing if ICD will go off and shock him. He has an appointment today with a clinic and will address a note to return to exercise with his physician.   Follow up call is scheduled for 02/09/2019 930AM  30 day review completed. ITP sent to Dr. Emily Filbert, Medical Director of Cardiac and Pulmonary Rehab. Continue with ITP unless changes are made by physician.  Department operating under reduced schedule until further notice by request from hospital leadership.  30 day chart review completed. ITP sent to Dr Zachery Dakins Medical Director, for review,changes as needed and signature. Peter Frey is  out for medical reasons   Row Name 03/27/19 1140           ITP Comments  Pt sent letter to discharge.  [facility] authorization has timed out.  Last attended on 01/03/19.  Discharge ITP created and sent for review.          Comments: Discharge ITP

## 2019-03-27 NOTE — Progress Notes (Signed)
Discharge Progress Report  Patient Details  Name: Peter Frey MRN: [id number] Date of Birth: February 10, 1950 Referring Provider:     Cardiac Rehab from 08/29/2018 in [facility] Cardiac and Pulmonary Rehab  Referring Provider  Peter Mango MD       Number of Visits: 22  Reason for Discharge:  Early Exit:  Insurance and Lack of attendance  Smoking History:  Social History   Tobacco Use  Smoking Status Former Smoker  . Packs/day: 2.00  . Years: 43.00  . Pack years: 86.00  . Types: Cigarettes  . Quit date: 2015  . Years since quitting: 6.2  Smokeless Tobacco Never Used    Diagnosis:  Cardiomyopathy, unspecified type ([place])  ADL UCSD:   Initial Exercise Prescription:   Discharge Exercise Prescription (Final Exercise Prescription Changes): Exercise Prescription Changes - 01/12/19 1000      Response to Exercise   Blood Pressure (Admit)  126/64    Blood Pressure (Exercise)  128/64    Blood Pressure (Exit)  116/64    Heart Rate (Admit)  73 bpm    Heart Rate (Exercise)  99 bpm    Heart Rate (Exit)  67 bpm    Rating of Perceived Exertion (Exercise)  15    Symptoms  none    Duration  Continue with 30 min of aerobic exercise without signs/symptoms of physical distress.    Intensity  THRR unchanged      Progression   Progression  Continue to progress workloads to maintain intensity without signs/symptoms of physical distress.    Average METs  2.6      Resistance Training   Training Prescription  Yes    Weight  5 lb    Reps  10-15      Interval Training   Interval Training  No      Recumbant Bike   Level  3    RPM  60    Minutes  15    METs  2.9      NuStep   Level  5    Minutes  15    METs  2.3      Home Exercise Plan   Plans to continue exercise at  Home (comment)   considering gym   Frequency  Add 2 additional days to program exercise sessions.    Initial Home Exercises Provided  10/06/18       Functional Capacity:   Psychological, QOL, Others -  Outcomes: PHQ 2/9: Depression screen PHQ 2/9 08/29/2018  Decreased Interest 0  Down, Depressed, Hopeless 0  PHQ - 2 Score 0  Altered sleeping 0  Tired, decreased energy 3  Change in appetite 0  Feeling bad or failure about yourself  0  Trouble concentrating 0  Moving slowly or fidgety/restless 0  Suicidal thoughts 0  PHQ-9 Score 3  Difficult doing work/chores Not difficult at all    Quality of Life:   Personal Goals: Goals established at orientation with interventions provided to work toward goal.    Personal Goals Discharge: Goals and Risk Factor Review    Row Name 10/06/18 1019 11/08/18 1010 11/10/18 0951 11/24/18 0944 12/29/18 1138     Core Components/Risk Factors/Patient Goals Review   Personal Goals Review  Hypertension;Lipids;Other  Weight Management/Obesity;Improve shortness of breath with ADL's;Diabetes;Hypertension;Lipids  Weight Management/Obesity;Improve shortness of breath with ADL's;Diabetes;Hypertension;Lipids  Weight Management/Obesity;Improve shortness of breath with ADL's;Diabetes;Hypertension;Lipids  Weight Management/Obesity;Improve shortness of breath with ADL's;Diabetes;Hypertension;Lipids   Review  Peter Frey is taking all meds as directed.  He  is trying to eat more vegetables and less fried food.  He is walking some outside class.He does check BP at home and it stays stable.  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 His blood pressure has been good but was slightly low today. His lipids have been good. He takes his cholesterol pill at night before bed.  Peter Frey has been doing well in rehab.  His weight is down some overall.  He has been compliant with his medications.  His pressures have been good in class but he does not check it at one.  We talked about getting cuff out and taking it at home.  Blood sugars are getting better.  His A1c was down to 6.2 yesterday at the [facility]!!  He is still having some SOB and has not noticed  improvement yet.  Peter Frey is doing well.  They cut his losartan in half and he feels much better.  His blood pressure is no longer dropping when he checks it at home.  A1c is 6.2 and he is no longer taking anything for it.  His SOB is still linger but it is getting better. We talked using PLB to help manage his SOB.  His weight is rock solid steady!!  Peter Frey is doing well.  His weight is holding steady.  Blood pressures and blood sugars have done well and he continues to monitor them at home.  He is doing better with his SOB.   Expected Outcomes  Short - continue heart healthy changes Long - maintain heart healthy habits  Short: Attend HeartTrack regularly to improve shortness of breath with ADL's. Long: maintain independence with ADL's  Short: Get blood pressure cuff out and start checking at home.  Long: Continue to work on weight loss.  Short: Use PLB to manage breathing.  Long: Continue to manage risk factors.  Short: Continue to work on breathing.  Long: Continue to monitor risk factors.      Exercise Goals and Review:   Exercise Goals Re-Evaluation: Exercise Goals Re-Evaluation    Row Name 10/25/18 1044 11/03/18 1614 11/10/18 0955 11/16/18 1336 11/24/18 0941     Exercise Goal Re-Evaluation   Exercise Goals Review  --  Increase Physical Activity;Increase Strength and Stamina;Able to understand and use rate of perceived exertion (RPE) scale;Knowledge and understanding of Target Heart Rate Range (THRR);Able to check pulse independently;Understanding of Exercise Prescription  Increase Physical Activity;Increase Strength and Stamina;Understanding of Exercise Prescription  Increase Physical Activity;Increase Strength and Stamina;Understanding of Exercise Prescription  Increase Physical Activity;Increase Strength and Stamina;Understanding of Exercise Prescription   Comments  Peter Frey has been out since 9/24.  This is Davids first week back in almost a month.  He is building  back to previous levels.  Peter Frey  has been doing well in rehab.  He has not done much at home this month with everything that is going on.  He is still having some SOB.  He has not noticed an improvement with strength and stamina yet.  He is able to do what he wants but modifies it some.  Peter Frey continues to do well in rehab.  He has been having some issues with his blood pressures at home.  He is on level 4 on the NuStep.  We will continue to monitor his progress.  Peter Frey is doing well in rehab.  We are trying increase his mobility on the treadmill.  We talked about doing the SET program after  graduation.  He has not been doing his home exercise.  He is going to try to get back to it.  He usually walks at home but since his medical scare he backed off.  He is feeling better now.   Expected Outcomes  --  Short - get back on track consistently Long - increase MET level  Short: Get back to exercising on off days.  Long: Continue to improve stamina.  Short: Add incline on treadmill  Long: Continue increase activity levels.  Short: Get back to walking at home.  Long: Talk to Olla about PAD SET program.   Row Name 12/02/18 1223 12/15/18 1020 12/29/18 1132 12/30/18 1336 01/12/19 1045     Exercise Goal Re-Evaluation   Exercise Goals Review  Increase Physical Activity;Increase Strength and Stamina;Able to understand and use rate of perceived exertion (RPE) scale;Able to understand and use Dyspnea scale;Knowledge and understanding of Target Heart Rate Range (THRR);Able to check pulse independently;Understanding of Exercise Prescription  Increase Physical Activity;Increase Strength and Stamina;Understanding of Exercise Prescription  Increase Physical Activity;Increase Strength and Stamina;Understanding of Exercise Prescription  Increase Physical Activity;Increase Strength and Stamina;Able to understand and use rate of perceived exertion (RPE) scale;Knowledge and understanding of Target Heart Rate Range (THRR);Able to check pulse independently;Understanding  of Exercise Prescription  Increase Physical Activity;Increase Strength and Stamina;Able to understand and use rate of perceived exertion (RPE) scale;Able to understand and use Dyspnea scale;Knowledge and understanding of Target Heart Rate Range (THRR);Able to check pulse independently;Understanding of Exercise Prescription   Comments  Peter Frey continues to do well.  He attends consistently and works at proper RPE when he is here.  Peter Frey continues to do well in rehab.  He is on level 4 for NuStep and getting 3.3 METs for bike.  We will continue to monitor his progress.  Peter Frey continues to do well in rehab.  He has been walking some at home.  He is building back up.  He is feeling stronger and has more stamina.  Peter Frey has tried the TM and elliptical.  He had some claudication pain on the TM and elliptical was challenging for him.  Peter Frey continues to do well in rehab.  Staff will monitor progress.   Expected Outcomes  Short - add walking outside HT sessions Long : maintain exercise  Short: Talk about adding in intervals. Long: Continue to improve stamina.  Short: Continue to build exercise back up at home. Long: Continue to improve stamina.  Short - continue to try TM and elliptical  Long : build overall stamina  Short - try Tm and elliptical nest sessions Long - continue to build stamina   Row Name 03/08/19 1336             Exercise Goal Re-Evaluation   Comments  Out since last review.          Nutrition & Weight - Outcomes:    Nutrition:   Nutrition Discharge:   Education Questionnaire Score:   Goals reviewed with patient; copy given to patient.

## 2019-04-05 ENCOUNTER — Encounter: Payer: Self-pay | Admitting: *Deleted

## 2019-04-05 DIAGNOSIS — I429 Cardiomyopathy, unspecified: Secondary | ICD-10-CM

## 2019-04-05 NOTE — Progress Notes (Signed)
Cardiac Individual Treatment Plan  Patient Details  Name: DEMARRION MEIKLEJOHN MRN: [id number] Date of Birth: 08-07-1950 Referring Provider:     Cardiac Rehab from 08/29/2018 in [facility] Cardiac and Pulmonary Rehab  Referring Provider  Nicholes Mango MD      Initial Encounter Date:    Cardiac Rehab from 08/29/2018 in [facility] Cardiac and Pulmonary Rehab  Date  08/29/18      Visit Diagnosis: Cardiomyopathy, unspecified type ([place])  Patient's Home Medications on Admission:  Current Outpatient Medications:  .  acetaminophen (TYLENOL) 500 MG tablet, Take 500 mg by mouth every 6 (six) hours as needed., Disp: , Rfl:  .  albuterol (VENTOLIN HFA) 108 (90 Base) MCG/ACT inhaler, Inhale into the lungs., Disp: , Rfl:  .  aspirin 81 MG tablet, Take 81 mg by mouth daily., Disp: , Rfl:  .  furosemide (LASIX) 40 MG tablet, Take 40 mg by mouth daily. , Disp: , Rfl:  .  gabapentin (NEURONTIN) 300 MG capsule, Take 300 mg by mouth 3 (three) times daily., Disp: , Rfl:  .  losartan (COZAAR) 25 MG tablet, Take 25 mg by mouth daily. , Disp: , Rfl:  .  metoprolol (LOPRESSOR) 50 MG tablet, Take 50 mg by mouth 2 (two) times daily., Disp: , Rfl:  .  sildenafil (VIAGRA) 50 MG tablet, Take by mouth as needed. , Disp: , Rfl:  .  simvastatin (ZOCOR) 20 MG tablet, Take 20 mg by mouth daily., Disp: , Rfl:  .  spironolactone (ALDACTONE) 25 MG tablet, Take 25 mg by mouth daily. , Disp: , Rfl:  .  TIOTROPIUM BROMIDE-OLODATEROL IN, Inhale 2 Inhalers into the lungs daily., Disp: , Rfl:   Past Medical History: Past Medical History:  Diagnosis Date  . CHF (congestive heart failure) ([place])   . COPD (chronic obstructive pulmonary disease) ([place])   . Coronary artery disease   . MI (myocardial infarction) ([place])     Tobacco Use: Social History   Tobacco Use  Smoking Status Former Smoker  . Packs/day: 2.00  . Years: 43.00  . Pack years: 86.00  . Types: Cigarettes  . Quit date: 2015  . Years since quitting: 6.2  Smokeless Tobacco  Never Used    Labs: Recent Review [facility] manager for ITP Cardiac and Pulmonary Rehab Latest Ref Rng & Units 10/18/2018   Cholestrol 0 - 200 mg/dL 94   LDLCALC 0 - 99 mg/dL 29   HDL >40 mg/dL 50   Trlycerides <150 mg/dL 73       Exercise Target Goals: Exercise Program Goal: Individual exercise prescription set using results from initial 6 min walk test and THRR while considering  patient's activity barriers and safety.   Exercise Prescription Goal: Initial exercise prescription builds to 30-45 minutes a day of aerobic activity, 2-3 days per week.  Home exercise guidelines will be given to patient during program as part of exercise prescription that the participant will acknowledge.  Activity Barriers & Risk Stratification:   6 Minute Walk:   Oxygen Initial Assessment:   Oxygen Re-Evaluation:   Oxygen Discharge (Final Oxygen Re-Evaluation):   Initial Exercise Prescription:   Perform Capillary Blood Glucose checks as needed.  Exercise Prescription Changes:   Exercise Comments:   Exercise Goals and Review:   Exercise Goals Re-Evaluation : Exercise Goals Re-Evaluation    Row Name 03/08/19 1336             Exercise Goal Re-Evaluation   Comments  Out since last review.  Discharge Exercise Prescription (Final Exercise Prescription Changes):   Nutrition:  Target Goals: Understanding of nutrition guidelines, daily intake of sodium <1545m, cholesterol <2048m calories 30% from fat and 7% or less from saturated fats, daily to have 5 or more servings of fruits and vegetables.  Biometrics:    Nutrition Therapy Plan and Nutrition Goals:   Nutrition Assessments:   Nutrition Goals Re-Evaluation:   Nutrition Goals Discharge (Final Nutrition Goals Re-Evaluation):   Psychosocial: Target Goals: Acknowledge presence or absence of significant depression and/or stress, maximize coping skills, provide positive support system. Participant is  able to verbalize types and ability to use techniques and skills needed for reducing stress and depression.   Initial Review & Psychosocial Screening:   Quality of Life Scores:   Scores of 19 and below usually indicate a poorer quality of life in these areas.  A difference of  2-3 points is a clinically meaningful difference.  A difference of 2-3 points in the total score of the Quality of Life Index has been associated with significant improvement in overall quality of life, self-image, physical symptoms, and general health in studies assessing change in quality of life.  PHQ-9: Recent Review Flowsheet Data    [facility]   Decreased Interest 0   Down, Depressed, Hopeless 0   PHQ - 2 Score 0   Altered sleeping 0   Tired, decreased energy 3   Change in appetite 0   Feeling bad or failure about yourself  0   Trouble concentrating 0   Moving slowly or fidgety/restless 0   Suicidal thoughts 0   PHQ-9 Score 3   Difficult doing work/chores Not difficult at all     Interpretation of Total Score  Total Score Depression Severity:  1-4 = Minimal depression, 5-9 = Mild depression, 10-14 = Moderate depression, 15-19 = Moderately severe depression, 20-27 = Severe depression   Psychosocial Evaluation and Intervention:   Psychosocial Re-Evaluation:   Psychosocial Discharge (Final Psychosocial Re-Evaluation):   Vocational Rehabilitation: Provide vocational rehab assistance to qualifying candidates.   Vocational Rehab Evaluation & Intervention:   Education: Education Goals: Education classes will be provided on a variety of topics geared toward better understanding of heart health and risk factor modification. Participant will state understanding/return demonstration of topics presented as noted by education test scores.  Learning Barriers/Preferences:   Education Topics:  AED/CPR: - Group verbal and written instruction with the use of models to  demonstrate the basic use of the AED with the basic ABC's of resuscitation.   General Nutrition Guidelines/Fats and Fiber: -Group instruction provided by verbal, written material, models and posters to present the general guidelines for heart healthy nutrition. Gives an explanation and review of dietary fats and fiber.   [facility]  Date  [facility]  [facility]  1- Verbalizes Understanding      Controlling Sodium/Reading Food Labels: -Group verbal and written material supporting the discussion of sodium use in heart healthy nutrition. Review and explanation with models, verbal and written materials for utilization of the food label.   Exercise Physiology & General Exercise Guidelines: - Group verbal and written instruction with models to review the exercise physiology of the cardiovascular system and associated critical values. Provides general exercise guidelines with specific guidelines to those with heart or lung disease.    Aerobic Exercise & Resistance Training: - Gives group verbal and written instruction on the various components of  exercise. Focuses on aerobic and resistive training programs and the benefits of this training and how to safely progress through these programs..   Cardiac Rehab from 12/15/2018 in [facility] Cardiac and Pulmonary Rehab  Date  11/17/18  Educator  jh  Instruction Review Code  1- Verbalizes Understanding      Flexibility, Balance, Mind/Body Relaxation: Provides group verbal/written instruction on the benefits of flexibility and balance training, including mind/body exercise modes such as yoga, pilates and tai chi.  Demonstration and skill practice provided.   Cardiac Rehab from 12/15/2018 in [facility] Cardiac and Pulmonary Rehab  Date  12/15/18  Educator  AS  Instruction Review Code  1- Verbalizes Understanding      Stress and Anxiety: - Provides group verbal and written  instruction about the health risks of elevated stress and causes of high stress.  Discuss the correlation between heart/lung disease and anxiety and treatment options. Review healthy ways to manage with stress and anxiety.   Depression: - Provides group verbal and written instruction on the correlation between heart/lung disease and depressed mood, treatment options, and the stigmas associated with seeking treatment.   Anatomy & Physiology of the Heart: - Group verbal and written instruction and models provide basic cardiac anatomy and physiology, with the coronary electrical and arterial systems. Review of Valvular disease and Heart Failure   Cardiac Procedures: - Group verbal and written instruction to review commonly prescribed medications for heart disease. Reviews the medication, class of the drug, and side effects. Includes the steps to properly store meds and maintain the prescription regimen. (beta blockers and nitrates)   Cardiac Rehab from 12/15/2018 in [facility] Cardiac and Pulmonary Rehab  Date  11/17/18  Educator  mc  Instruction Review Code  1- Verbalizes Understanding      Cardiac Medications I: - Group verbal and written instruction to review commonly prescribed medications for heart disease. Reviews the medication, class of the drug, and side effects. Includes the steps to properly store meds and maintain the prescription regimen.   Cardiac Medications II: -Group verbal and written instruction to review commonly prescribed medications for heart disease. Reviews the medication, class of the drug, and side effects. (all other drug classes)   Cardiac Rehab from 11/03/2018 in [facility] Cardiac and Pulmonary Rehab  Date  11/03/18  Educator  [facility]  Instruction Review Code  1- Verbalizes Understanding       Go Sex-Intimacy & Heart Disease, Get SMART - Goal Setting: - Group verbal and written instruction through game format to discuss heart disease and the return to sexual intimacy.  Provides group verbal and written material to discuss and apply goal setting through the application of the S.M.A.R.T. Method.   Cardiac Rehab from 12/15/2018 in [facility] Cardiac and Pulmonary Rehab  Date  11/17/18  Educator  mc  Instruction Review Code  1- Verbalizes Understanding      Other Matters of the Heart: - Provides group verbal, written materials and models to describe Stable Angina and Peripheral Artery. Includes description of the disease process and treatment options available to the cardiac patient.   Exercise & Equipment Safety: - Individual verbal instruction and demonstration of equipment use and safety with use of the equipment.   Cardiac Rehab from 08/29/2018 in [facility] Cardiac and Pulmonary Rehab  Date  08/29/18  Educator  [facility]  Instruction Review Code  1- Verbalizes Understanding      Infection Prevention: - Provides verbal and written material to individual with discussion of infection control including proper hand  washing and proper equipment cleaning during exercise session.   Cardiac Rehab from 08/29/2018 in [facility] Cardiac and Pulmonary Rehab  Date  08/29/18  Educator  [facility]  Instruction Review Code  1- Verbalizes Understanding      Falls Prevention: - Provides verbal and written material to individual with discussion of falls prevention and safety.   Cardiac Rehab from 08/29/2018 in [facility] Cardiac and Pulmonary Rehab  Date  08/29/18  Educator  [facility]  Instruction Review Code  1- Verbalizes Understanding      Diabetes: - Individual verbal and written instruction to review signs/symptoms of diabetes, desired ranges of glucose level fasting, after meals and with exercise. Acknowledge that pre and post exercise glucose checks will be done for 3 sessions at entry of program.   Know Your Numbers and Risk Factors: -Group verbal and written instruction about important numbers in your health.  Discussion of what are risk factors and how they play a role in the disease process.   Review of Cholesterol, Blood Pressure, Diabetes, and BMI and the role they play in your overall health.   Cardiac Rehab from 11/03/2018 in [facility] Cardiac and Pulmonary Rehab  Date  11/03/18  Educator  [facility]  Instruction Review Code  1- Verbalizes Understanding      Sleep Hygiene: -Provides group verbal and written instruction about how sleep can affect your health.  Define sleep hygiene, discuss sleep cycles and impact of sleep habits. Review good sleep hygiene tips.    Other: -Provides group and verbal instruction on various topics (see comments)   Knowledge Questionnaire Score:   Core Components/Risk Factors/Patient Goals at Admission:   Core Components/Risk Factors/Patient Goals Review:    Core Components/Risk Factors/Patient Goals at Discharge (Final Review):    ITP Comments: ITP Comments    Row Name 02/08/19 0949 03/08/19 0615 03/27/19 1140 04/05/19 0556     ITP Comments  30 day review completed. ITP sent to Dr. Emily Filbert, Medical Director of Cardiac and Pulmonary Rehab. Continue with ITP unless changes are made by physician.  Department operating under reduced schedule until further notice by request from hospital leadership.  30 day chart review completed. ITP sent to Dr Zachery Dakins Medical Director, for review,changes as needed and signature. [name] is out for medical reasons  Pt sent letter to discharge.  [facility] authorization has timed out.  Last attended on 01/03/19.  Discharge ITP created and sent for review.  discharged to readmit when medically released and new referral received from [facility]       Comments:

## 2020-01-10 ENCOUNTER — Other Ambulatory Visit: Payer: Medicare Other

## 2020-08-14 ENCOUNTER — Other Ambulatory Visit: Payer: Self-pay

## 2020-08-14 ENCOUNTER — Encounter: Payer: No Typology Code available for payment source | Attending: Cardiology | Admitting: *Deleted

## 2020-08-14 DIAGNOSIS — Z79899 Other long term (current) drug therapy: Secondary | ICD-10-CM | POA: Insufficient documentation

## 2020-08-14 DIAGNOSIS — G4733 Obstructive sleep apnea (adult) (pediatric): Secondary | ICD-10-CM | POA: Insufficient documentation

## 2020-08-14 DIAGNOSIS — J449 Chronic obstructive pulmonary disease, unspecified: Secondary | ICD-10-CM | POA: Insufficient documentation

## 2020-08-14 DIAGNOSIS — I208 Other forms of angina pectoris: Secondary | ICD-10-CM | POA: Insufficient documentation

## 2020-08-14 DIAGNOSIS — I5022 Chronic systolic (congestive) heart failure: Secondary | ICD-10-CM | POA: Insufficient documentation

## 2020-08-14 DIAGNOSIS — Z7982 Long term (current) use of aspirin: Secondary | ICD-10-CM | POA: Insufficient documentation

## 2020-08-14 DIAGNOSIS — Z7901 Long term (current) use of anticoagulants: Secondary | ICD-10-CM | POA: Insufficient documentation

## 2020-08-14 DIAGNOSIS — Z87891 Personal history of nicotine dependence: Secondary | ICD-10-CM | POA: Insufficient documentation

## 2020-08-14 DIAGNOSIS — I5042 Chronic combined systolic (congestive) and diastolic (congestive) heart failure: Secondary | ICD-10-CM | POA: Insufficient documentation

## 2020-08-14 DIAGNOSIS — I779 Disorder of arteries and arterioles, unspecified: Secondary | ICD-10-CM | POA: Insufficient documentation

## 2020-08-14 NOTE — Progress Notes (Signed)
Initial telephone orientation completed. Diagnosis can be found in [place] records in [facility]. EP orientation scheduled for Thursday 8/4 at 8am.

## 2020-08-15 ENCOUNTER — Encounter: Payer: No Typology Code available for payment source | Admitting: *Deleted

## 2020-08-15 VITALS — Ht 72.0 in | Wt 168.9 lb

## 2020-08-15 DIAGNOSIS — I5022 Chronic systolic (congestive) heart failure: Secondary | ICD-10-CM | POA: Diagnosis present

## 2020-08-15 DIAGNOSIS — Z87891 Personal history of nicotine dependence: Secondary | ICD-10-CM | POA: Diagnosis not present

## 2020-08-15 DIAGNOSIS — Z7982 Long term (current) use of aspirin: Secondary | ICD-10-CM | POA: Diagnosis not present

## 2020-08-15 DIAGNOSIS — Z7901 Long term (current) use of anticoagulants: Secondary | ICD-10-CM | POA: Diagnosis not present

## 2020-08-15 DIAGNOSIS — Z79899 Other long term (current) drug therapy: Secondary | ICD-10-CM | POA: Diagnosis not present

## 2020-08-15 NOTE — Patient Instructions (Signed)
Patient Instructions  Patient Details  Name: Peter Frey MRN: [id number] Date of Birth: Nov 02, 1950 Referring Provider:  Caryn Section, MD  Below are your personal goals for exercise, nutrition, and risk factors. Our goal is to help you stay on track towards obtaining and maintaining these goals. We will be discussing your progress on these goals with you throughout the program.  Initial Exercise Prescription:  Initial Exercise Prescription - 08/15/20 0900       Date of Initial Exercise RX and Referring Provider   Date 08/15/20    Referring Provider Constance Haw MD      Treadmill   MPH 2.2    Grade 1    Minutes 15    METs 2.99      Arm Ergometer   Level 2    Watts 40    RPM 30    Minutes 15    METs 2      REL-XR   Level 2    Speed 50    Minutes 15    METs 2      T5 Nustep   Level 3    SPM 80    Minutes 15    METs 2      Track   Laps 31    Minutes 15    METs 2.69      Prescription Details   Frequency (times per week) 3    Duration Progress to 30 minutes of continuous aerobic without signs/symptoms of physical distress      Intensity   THRR 40-80% of Max Heartrate 100-135    Ratings of Perceived Exertion 11-13    Perceived Dyspnea 0-4      Progression   Progression Continue to progress workloads to maintain intensity without signs/symptoms of physical distress.      Resistance Training   Training Prescription Yes    Weight 4 lb    Reps 10-15             Exercise Goals: Frequency: Be able to perform aerobic exercise two to three times per week in program working toward 2-5 days per week of home exercise.  Intensity: Work with a perceived exertion of 11 (fairly light) - 15 (hard) while following your exercise prescription.  We will make changes to your prescription with you as you progress through the program.   Duration: Be able to do 30 to 45 minutes of continuous aerobic exercise in addition to a 5 minute warm-up and a 5 minute cool-down  routine.   Nutrition Goals: Your personal nutrition goals will be established when you do your nutrition analysis with the dietician.  The following are general nutrition guidelines to follow: Cholesterol < 200mg /day Sodium < 1500mg /day Fiber: Men over 50 yrs - 30 grams per day  Personal Goals:  Personal Goals and Risk Factors at Admission - 08/15/20 0957       Core Components/Risk Factors/Patient Goals on Admission    Weight Management Yes;Weight Maintenance    Intervention Weight Management: Develop a combined nutrition and exercise program designed to reach desired caloric intake, while maintaining appropriate intake of nutrient and fiber, sodium and fats, and appropriate energy expenditure required for the weight goal.;Weight Management: Provide education and appropriate resources to help participant work on and attain dietary goals.    Admit Weight 168 lb 14.4 oz (76.6 kg)    Goal Weight: Short Term 168 lb (76.2 kg)    Goal Weight: Long Term 168 lb (76.2 kg)  Expected Outcomes Short Term: Continue to assess and modify interventions until short term weight is achieved;Long Term: Adherence to nutrition and physical activity/exercise program aimed toward attainment of established weight goal;Weight Maintenance: Understanding of the daily nutrition guidelines, which includes 25-35% calories from fat, 7% or less cal from saturated fats, less than 200mg  cholesterol, less than 1.5gm of sodium, & 5 or more servings of fruits and vegetables daily    Intervention Provide a combined exercise and nutrition program that is supplemented with education, support and counseling about heart failure. Directed toward relieving symptoms such as shortness of breath, decreased exercise tolerance, and extremity edema.    Expected Outcomes Improve functional capacity of life;Short term: Attendance in program 2-3 days a week with increased exercise capacity. Reported lower sodium intake. Reported increased fruit  and vegetable intake. Reports medication compliance.;Short term: Daily weights obtained and reported for increase. Utilizing diuretic protocols set by physician.;Long term: Adoption of self-care skills and reduction of barriers for early signs and symptoms recognition and intervention leading to self-care maintenance.    Hypertension Yes    Intervention Provide education on lifestyle modifcations including regular physical activity/exercise, weight management, moderate sodium restriction and increased consumption of fresh fruit, vegetables, and low fat dairy, alcohol moderation, and smoking cessation.;Monitor prescription use compliance.    Expected Outcomes Short Term: Continued assessment and intervention until BP is < 140/26mm HG in hypertensive participants. < 130/66mm HG in hypertensive participants with diabetes, heart failure or chronic kidney disease.;Long Term: Maintenance of blood pressure at goal levels.    Lipids Yes    Intervention Provide education and support for participant on nutrition & aerobic/resistive exercise along with prescribed medications to achieve LDL 70mg , HDL >40mg .    Expected Outcomes Short Term: Participant states understanding of desired cholesterol values and is compliant with medications prescribed. Participant is following exercise prescription and nutrition guidelines.;Long Term: Cholesterol controlled with medications as prescribed, with individualized exercise RX and with personalized nutrition plan. Value goals: LDL < 70mg , HDL > 40 mg.             Tobacco Use Initial Evaluation: Social History   Tobacco Use  Smoking Status Former   Packs/day: 2.00   Years: 43.00   Pack years: 86.00   Types: Cigarettes   Quit date: 2015   Years since quitting: 7.5  Smokeless Tobacco Never    Exercise Goals and Review:  Exercise Goals     Row Name 08/15/20 0955             Exercise Goals   Increase Physical Activity Yes       Intervention Provide advice,  education, support and counseling about physical activity/exercise needs.;Develop an individualized exercise prescription for aerobic and resistive training based on initial evaluation findings, risk stratification, comorbidities and participant's personal goals.       Expected Outcomes Short Term: Attend rehab on a regular basis to increase amount of physical activity.;Long Term: Add in home exercise to make exercise part of routine and to increase amount of physical activity.;Long Term: Exercising regularly at least 3-5 days a week.       Increase Strength and Stamina Yes       Intervention Provide advice, education, support and counseling about physical activity/exercise needs.;Develop an individualized exercise prescription for aerobic and resistive training based on initial evaluation findings, risk stratification, comorbidities and participant's personal goals.       Expected Outcomes Short Term: Increase workloads from initial exercise prescription for resistance, speed, and METs.;Short Term:  Perform resistance training exercises routinely during rehab and add in resistance training at home;Long Term: Improve cardiorespiratory fitness, muscular endurance and strength as measured by increased METs and functional capacity ( )       Able to understand and use rate of perceived exertion (RPE) scale Yes       Intervention Provide education and explanation on how to use RPE scale       Expected Outcomes Short Term: Able to use RPE daily in rehab to express subjective intensity level;Long Term:  Able to use RPE to guide intensity level when exercising independently       Able to understand and use Dyspnea scale Yes       Intervention Provide education and explanation on how to use Dyspnea scale       Expected Outcomes Short Term: Able to use Dyspnea scale daily in rehab to express subjective sense of shortness of breath during exertion;Long Term: Able to use Dyspnea scale to guide intensity level when  exercising independently       Knowledge and understanding of Target Heart Rate Range (THRR) Yes       Intervention Provide education and explanation of THRR including how the numbers were predicted and where they are located for reference       Expected Outcomes Short Term: Able to state/look up THRR;Short Term: Able to use daily as guideline for intensity in rehab;Long Term: Able to use THRR to govern intensity when exercising independently       Able to check pulse independently Yes       Intervention Provide education and demonstration on how to check pulse in carotid and radial arteries.;Review the importance of being able to check your own pulse for safety during independent exercise       Expected Outcomes Short Term: Able to explain why pulse checking is important during independent exercise;Long Term: Able to check pulse independently and accurately       Understanding of Exercise Prescription Yes       Intervention Provide education, explanation, and written materials on patient's individual exercise prescription       Expected Outcomes Short Term: Able to explain program exercise prescription;Long Term: Able to explain home exercise prescription to exercise independently                Copy of goals given to participant.

## 2020-08-15 NOTE — Progress Notes (Signed)
Cardiac Individual Treatment Plan  Patient Details  Name: Peter Frey MRN: [id number] Date of Birth: 02/26/1950 Referring Provider:   Flowsheet Row Cardiac Rehab from 08/15/2020 in [facility] Cardiac and Pulmonary Rehab  Referring Provider Constance Haw MD       Initial Encounter Date:  Flowsheet Row Cardiac Rehab from 08/15/2020 in [facility] Cardiac and Pulmonary Rehab  Date 08/15/20       Visit Diagnosis: Heart failure, chronic systolic (HCC)  Patient's Home Medications on Admission:  Current Outpatient Medications:    acetaminophen (TYLENOL) 500 MG tablet, Take 500 mg by mouth every 6 (six) hours as needed., Disp: , Rfl:    albuterol (VENTOLIN HFA) 108 (90 Base) MCG/ACT inhaler, Inhale into the lungs., Disp: , Rfl:    aspirin 81 MG tablet, Take 81 mg by mouth daily., Disp: , Rfl:    atorvastatin (LIPITOR) 80 MG tablet, TAKE ONE-HALF TABLET BY MOUTH AT BEDTIME FOR HEART AND CHOLESTEROL., Disp: , Rfl:    empagliflozin (JARDIANCE) 25 MG TABS tablet, Take 0.5 tablets by mouth daily., Disp: , Rfl:    ezetimibe (ZETIA) 10 MG tablet, Take 1 tablet by mouth daily., Disp: , Rfl:    furosemide (LASIX) 40 MG tablet, Take 40 mg by mouth daily. , Disp: , Rfl:    gabapentin (NEURONTIN) 300 MG capsule, Take 300 mg by mouth 3 (three) times daily., Disp: , Rfl:    losartan (COZAAR) 25 MG tablet, Take 25 mg by mouth daily. , Disp: , Rfl:    metoprolol (LOPRESSOR) 50 MG tablet, Take 50 mg by mouth 2 (two) times daily. (Patient not taking: Reported on 08/14/2020), Disp: , Rfl:    metoprolol succinate (TOPROL-XL) 100 MG 24 hr tablet, TAKE ONE-HALF TABLET BY MOUTH EVERY DAY FOR HEART AND BLOOD PRESSURE., Disp: , Rfl:    sildenafil (VIAGRA) 50 MG tablet, Take by mouth as needed.  (Patient not taking: Reported on 08/14/2020), Disp: , Rfl:    simvastatin (ZOCOR) 20 MG tablet, Take 20 mg by mouth daily. (Patient not taking: Reported on 08/14/2020), Disp: , Rfl:    spironolactone (ALDACTONE) 25 MG tablet, Take 25 mg by mouth  daily. , Disp: , Rfl:    TIOTROPIUM BROMIDE-OLODATEROL IN, Inhale 2 Inhalers into the lungs daily., Disp: , Rfl:   Past Medical History: Past Medical History:  Diagnosis Date   CHF (congestive heart failure) (HCC)    COPD (chronic obstructive pulmonary disease) (HCC)    Coronary artery disease    MI (myocardial infarction) (HCC)     Tobacco Use: Social History   Tobacco Use  Smoking Status Former   Packs/day: 2.00   Years: 43.00   Pack years: 86.00   Types: Cigarettes   Quit date: 2015   Years since quitting: 7.5  Smokeless Tobacco Never    Labs: Recent Review Geneticist, molecular for ITP Cardiac and Pulmonary Rehab Latest Ref Rng & Units 10/18/2018   Cholestrol 0 - 200 mg/dL 94   LDLCALC 0 - 99 mg/dL 29   HDL >32 mg/dL 50   Trlycerides <951 mg/dL 73        Exercise Target Goals: Exercise Program Goal: Individual exercise prescription set using results from initial 6 min walk test and THRR while considering  patient's activity barriers and safety.   Exercise Prescription Goal: Initial exercise prescription builds to 30-45 minutes a day of aerobic activity, 2-3 days per week.  Home exercise guidelines will be given to patient during program as part  of exercise prescription that the participant will acknowledge.   Education: Aerobic Exercise: - Group verbal and visual presentation on the components of exercise prescription. Introduces F.I.T.T principle from ACSM for exercise prescriptions.  Reviews F.I.T.T. principles of aerobic exercise including progression. Written material given at graduation. Flowsheet Row Cardiac Rehab from 12/15/2018 in [facility] Cardiac and Pulmonary Rehab  Date 11/17/18  Educator jh  Instruction Review Code 1- Verbalizes Understanding       Education: Resistance Exercise: - Group verbal and visual presentation on the components of exercise prescription. Introduces F.I.T.T principle from ACSM for exercise prescriptions  Reviews F.I.T.T.  principles of resistance exercise including progression. Written material given at graduation.    Education: Exercise & Equipment Safety: - Individual verbal instruction and demonstration of equipment use and safety with use of the equipment. Flowsheet Row Cardiac Rehab from 08/15/2020 in [facility] Cardiac and Pulmonary Rehab  Date 08/15/20  Educator [facility]  Instruction Review Code 1- Verbalizes Understanding       Education: Exercise Physiology & General Exercise Guidelines: - Group verbal and written instruction with models to review the exercise physiology of the cardiovascular system and associated critical values. Provides general exercise guidelines with specific guidelines to those with heart or lung disease.    Education: Flexibility, Balance, Mind/Body Relaxation: - Group verbal and visual presentation with interactive activity on the components of exercise prescription. Introduces F.I.T.T principle from ACSM for exercise prescriptions. Reviews F.I.T.T. principles of flexibility and balance exercise training including progression. Also discusses the mind body connection.  Reviews various relaxation techniques to help reduce and manage stress (i.e. Deep breathing, progressive muscle relaxation, and visualization). Balance handout provided to take home. Written material given at graduation. Flowsheet Row Cardiac Rehab from 12/15/2018 in [facility] Cardiac and Pulmonary Rehab  Date 12/15/18  Educator AS  Instruction Review Code 1- Verbalizes Understanding       Activity Barriers & Risk Stratification:  Activity Barriers & Cardiac Risk Stratification - 08/15/20 0953       Activity Barriers & Cardiac Risk Stratification   Activity Barriers Shortness of Breath;Deconditioning;Muscular Weakness;Other (comment)    Comments legs burn/cramp with walking (PAD)    Cardiac Risk Stratification High             6 Minute Walk:  6 Minute Walk     Row Name 08/15/20 0945         6 Minute Walk    Phase Initial     Distance 1200 feet     Walk Time 6 minutes     # of Rest Breaks 0     MPH 2.27     METS 3.04     RPE 10     Perceived Dyspnea  1     VO2 Peak 10.63     Symptoms Yes (comment)     Comments bilateral legs buring 4/10, SOB     Resting HR 65 bpm     Resting BP 132/72     Resting Oxygen Saturation  98 %     Exercise Oxygen Saturation  during 6 min walk 98 %     Max Ex. HR 78 bpm     Max Ex. BP 136/74     2 Minute Post BP 128/70              Oxygen Initial Assessment:   Oxygen Re-Evaluation:   Oxygen Discharge (Final Oxygen Re-Evaluation):   Initial Exercise Prescription:  Initial Exercise Prescription - 08/15/20 0900  Date of Initial Exercise RX and Referring Provider   Date 08/15/20    Referring Provider Constance HawSun, Albert MD      Treadmill   MPH 2.2    Grade 1    Minutes 15    METs 2.99      Arm Ergometer   Level 2    Watts 40    RPM 30    Minutes 15    METs 2      REL-XR   Level 2    Speed 50    Minutes 15    METs 2      T5 Nustep   Level 3    SPM 80    Minutes 15    METs 2      Track   Laps 31    Minutes 15    METs 2.69      Prescription Details   Frequency (times per week) 3    Duration Progress to 30 minutes of continuous aerobic without signs/symptoms of physical distress      Intensity   THRR 40-80% of Max Heartrate 100-135    Ratings of Perceived Exertion 11-13    Perceived Dyspnea 0-4      Progression   Progression Continue to progress workloads to maintain intensity without signs/symptoms of physical distress.      Resistance Training   Training Prescription Yes    Weight 4 lb    Reps 10-15             Perform Capillary Blood Glucose checks as needed.  Exercise Prescription Changes:   Exercise Prescription Changes     Row Name 08/15/20 0900             Response to Exercise   Blood Pressure (Admit) 132/76       Blood Pressure (Exercise) 136/74       Blood Pressure (Exit) 128/70        Heart Rate (Admit) 65 bpm       Heart Rate (Exercise) 78 bpm       Heart Rate (Exit) 66 bpm       Oxygen Saturation (Admit) 98 %       Oxygen Saturation (Exercise) 98 %       Rating of Perceived Exertion (Exercise) 10       Perceived Dyspnea (Exercise) 1       Symptoms legs buring 4/10, SOB       Comments walk test results                Exercise Comments:   Exercise Goals and Review:   Exercise Goals     Row Name 08/15/20 0955             Exercise Goals   Increase Physical Activity Yes       Intervention Provide advice, education, support and counseling about physical activity/exercise needs.;Develop an individualized exercise prescription for aerobic and resistive training based on initial evaluation findings, risk stratification, comorbidities and participant's personal goals.       Expected Outcomes Short Term: Attend rehab on a regular basis to increase amount of physical activity.;Long Term: Add in home exercise to make exercise part of routine and to increase amount of physical activity.;Long Term: Exercising regularly at least 3-5 days a week.       Increase Strength and Stamina Yes       Intervention Provide advice, education, support and counseling about physical activity/exercise needs.;Develop an individualized exercise prescription for  aerobic and resistive training based on initial evaluation findings, risk stratification, comorbidities and participant's personal goals.       Expected Outcomes Short Term: Increase workloads from initial exercise prescription for resistance, speed, and METs.;Short Term: Perform resistance training exercises routinely during rehab and add in resistance training at home;Long Term: Improve cardiorespiratory fitness, muscular endurance and strength as measured by increased METs and functional capacity ( )       Able to understand and use rate of perceived exertion (RPE) scale Yes       Intervention Provide education and explanation on  how to use RPE scale       Expected Outcomes Short Term: Able to use RPE daily in rehab to express subjective intensity level;Long Term:  Able to use RPE to guide intensity level when exercising independently       Able to understand and use Dyspnea scale Yes       Intervention Provide education and explanation on how to use Dyspnea scale       Expected Outcomes Short Term: Able to use Dyspnea scale daily in rehab to express subjective sense of shortness of breath during exertion;Long Term: Able to use Dyspnea scale to guide intensity level when exercising independently       Knowledge and understanding of Target Heart Rate Range (THRR) Yes       Intervention Provide education and explanation of THRR including how the numbers were predicted and where they are located for reference       Expected Outcomes Short Term: Able to state/look up THRR;Short Term: Able to use daily as guideline for intensity in rehab;Long Term: Able to use THRR to govern intensity when exercising independently       Able to check pulse independently Yes       Intervention Provide education and demonstration on how to check pulse in carotid and radial arteries.;Review the importance of being able to check your own pulse for safety during independent exercise       Expected Outcomes Short Term: Able to explain why pulse checking is important during independent exercise;Long Term: Able to check pulse independently and accurately       Understanding of Exercise Prescription Yes       Intervention Provide education, explanation, and written materials on patient's individual exercise prescription       Expected Outcomes Short Term: Able to explain program exercise prescription;Long Term: Able to explain home exercise prescription to exercise independently                Exercise Goals Re-Evaluation :   Discharge Exercise Prescription (Final Exercise Prescription Changes):  Exercise Prescription Changes - 08/15/20 0900        Response to Exercise   Blood Pressure (Admit) 132/76    Blood Pressure (Exercise) 136/74    Blood Pressure (Exit) 128/70    Heart Rate (Admit) 65 bpm    Heart Rate (Exercise) 78 bpm    Heart Rate (Exit) 66 bpm    Oxygen Saturation (Admit) 98 %    Oxygen Saturation (Exercise) 98 %    Rating of Perceived Exertion (Exercise) 10    Perceived Dyspnea (Exercise) 1    Symptoms legs buring 4/10, SOB    Comments walk test results             Nutrition:  Target Goals: Understanding of nutrition guidelines, daily intake of sodium 1500mg , cholesterol 200mg , calories 30% from fat and 7% or less from saturated fats, daily to have  5 or more servings of fruits and vegetables.  Education: All About Nutrition: -Group instruction provided by verbal, written material, interactive activities, discussions, models, and posters to present general guidelines for heart healthy nutrition including fat, fiber, MyPlate, the role of sodium in heart healthy nutrition, utilization of the nutrition label, and utilization of this knowledge for meal planning. Follow up email sent as well. Written material given at graduation. Flowsheet Row Cardiac Rehab from 08/15/2020 in [facility] Cardiac and Pulmonary Rehab  Education need identified 08/15/20       Biometrics:  Pre Biometrics - 08/15/20 0955       Pre Biometrics   Height 6' (1.829 m)    Weight 168 lb 14.4 oz (76.6 kg)    BMI (Calculated) 22.9    Single Leg Stand 18.3 seconds              Nutrition Therapy Plan and Nutrition Goals:   Nutrition Assessments:  MEDIFICTS Score Key: ?70 Need to make dietary changes  40-70 Heart Healthy Diet ? 40 Therapeutic Level Cholesterol Diet  Flowsheet Row Cardiac Rehab from 08/15/2020 in [facility] Cardiac and Pulmonary Rehab  Picture Your Plate Total Score on Admission 63      Picture Your Plate Scores: <16 Unhealthy dietary pattern with much room for improvement. 41-50 Dietary pattern unlikely to meet  recommendations for good health and room for improvement. 51-60 More healthful dietary pattern, with some room for improvement.  >60 Healthy dietary pattern, although there may be some specific behaviors that could be improved.    Nutrition Goals Re-Evaluation:   Nutrition Goals Discharge (Final Nutrition Goals Re-Evaluation):   Psychosocial: Target Goals: Acknowledge presence or absence of significant depression and/or stress, maximize coping skills, provide positive support system. Participant is able to verbalize types and ability to use techniques and skills needed for reducing stress and depression.   Education: Stress, Anxiety, and Depression - Group verbal and visual presentation to define topics covered.  Reviews how body is impacted by stress, anxiety, and depression.  Also discusses healthy ways to reduce stress and to treat/manage anxiety and depression.  Written material given at graduation.   Education: Sleep Hygiene -Provides group verbal and written instruction about how sleep can affect your health.  Define sleep hygiene, discuss sleep cycles and impact of sleep habits. Review good sleep hygiene tips.    Initial Review & Psychosocial Screening:  Initial Psych Review & Screening - 08/14/20 1038       Initial Review   Current issues with None Identified      Family Dynamics   Good Support System? Yes   wife, sister, brother     Barriers   Psychosocial barriers to participate in program There are no identifiable barriers or psychosocial needs.;The patient should benefit from training in stress management and relaxation.      Screening Interventions   Interventions Encouraged to exercise;Provide feedback about the scores to participant;To provide support and resources with identified psychosocial needs    Expected Outcomes Short Term goal: Utilizing psychosocial counselor, staff and physician to assist with identification of specific Stressors or current issues  interfering with healing process. Setting desired goal for each stressor or current issue identified.;Long Term Goal: Stressors or current issues are controlled or eliminated.;Short Term goal: Identification and review with participant of any Quality of Life or Depression concerns found by scoring the questionnaire.;Long Term goal: The participant improves quality of Life and PHQ9 Scores as seen by post scores and/or verbalization of changes  Quality of Life Scores:   Quality of Life - 08/15/20 0956       Quality of Life   Select Quality of Life      Quality of Life Scores   Health/Function Pre 16.68 %    Socioeconomic Pre 19.5 %    Psych/Spiritual Pre 23.07 %    Family Pre 29.5 %    GLOBAL Pre 20.54 %            Scores of 19 and below usually indicate a poorer quality of life in these areas.  A difference of  2-3 points is a clinically meaningful difference.  A difference of 2-3 points in the total score of the Quality of Life Index has been associated with significant improvement in overall quality of life, self-image, physical symptoms, and general health in studies assessing change in quality of life.  PHQ-9: Recent Review Flowsheet Data     Depression screen [facility] 2/9 08/15/2020 08/29/2018   Decreased Interest 0 0   Down, Depressed, Hopeless 0 0   PHQ - 2 Score 0 0   Altered sleeping 0 0   Tired, decreased energy 1 3   Change in appetite 0 0   Feeling bad or failure about yourself  0 0   Trouble concentrating 0 0   Moving slowly or fidgety/restless 0 0   Suicidal thoughts 0 0   PHQ-9 Score 1 3   Difficult doing work/chores Somewhat difficult Not difficult at all      Interpretation of Total Score  Total Score Depression Severity:  1-4 = Minimal depression, 5-9 = Mild depression, 10-14 = Moderate depression, 15-19 = Moderately severe depression, 20-27 = Severe depression   Psychosocial Evaluation and Intervention:  Psychosocial Evaluation - 08/14/20 1043        Psychosocial Evaluation & Interventions   Interventions Encouraged to exercise with the program and follow exercise prescription    Comments Onalee Hua is returning to cardiac rehab for systolic heart failure. He states he has noticed a decrease in stamina and is looking forward to working at improving that. He reports no stress or concerns with depression. He is very thankful for his support system (wife, sister, brother) and sees each day as a blessing. He has been managing his heart failure symptoms pretty well and is interested in learning more about it.    Expected Outcomes Short: attend cardiac rehab for education and exercise. Long: develop and maintain positive self care habits.    Continue Psychosocial Services  Follow up required by staff             Psychosocial Re-Evaluation:   Psychosocial Discharge (Final Psychosocial Re-Evaluation):   Vocational Rehabilitation: Provide vocational rehab assistance to qualifying candidates.   Vocational Rehab Evaluation & Intervention:  Vocational Rehab - 08/14/20 1043       Initial Vocational Rehab Evaluation & Intervention   Assessment shows need for Vocational Rehabilitation No             Education: Education Goals: Education classes will be provided on a variety of topics geared toward better understanding of heart health and risk factor modification. Participant will state understanding/return demonstration of topics presented as noted by education test scores.  Learning Barriers/Preferences:  Learning Barriers/Preferences - 08/14/20 1043       Learning Barriers/Preferences   Learning Barriers None    Learning Preferences None             General Cardiac Education Topics:  AED/CPR: - Group verbal  and written instruction with the use of models to demonstrate the basic use of the AED with the basic ABC's of resuscitation.   Anatomy and Cardiac Procedures: - Group verbal and visual presentation and models  provide information about basic cardiac anatomy and function. Reviews the testing methods done to diagnose heart disease and the outcomes of the test results. Describes the treatment choices: Medical Management, Angioplasty, or Coronary Bypass Surgery for treating various heart conditions including Myocardial Infarction, Angina, Valve Disease, and Cardiac Arrhythmias.  Written material given at graduation.   Medication Safety: - Group verbal and visual instruction to review commonly prescribed medications for heart and lung disease. Reviews the medication, class of the drug, and side effects. Includes the steps to properly store meds and maintain the prescription regimen.  Written material given at graduation.   Intimacy: - Group verbal instruction through game format to discuss how heart and lung disease can affect sexual intimacy. Written material given at graduation..   Know Your Numbers and Heart Failure: - Group verbal and visual instruction to discuss disease risk factors for cardiac and pulmonary disease and treatment options.  Reviews associated critical values for Overweight/Obesity, Hypertension, Cholesterol, and Diabetes.  Discusses basics of heart failure: signs/symptoms and treatments.  Introduces Heart Failure Zone chart for action plan for heart failure.  Written material given at graduation.   Infection Prevention: - Provides verbal and written material to individual with discussion of infection control including proper hand washing and proper equipment cleaning during exercise session. Flowsheet Row Cardiac Rehab from 08/15/2020 in [facility] Cardiac and Pulmonary Rehab  Date 08/15/20  Educator [employer]  Instruction Review Code 1- Verbalizes Understanding       Falls Prevention: - Provides verbal and written material to individual with discussion of falls prevention and safety. Flowsheet Row Cardiac Rehab from 08/15/2020 in [facility] Cardiac and Pulmonary Rehab  Date 08/15/20  Educator [facility]   Instruction Review Code 1- Verbalizes Understanding       Other: -Provides group and verbal instruction on various topics (see comments)   Knowledge Questionnaire Score:  Knowledge Questionnaire Score - 08/15/20 0957       Knowledge Questionnaire Score   Pre Score 26/28 Education: Nutrition, Tobacco             Core Components/Risk Factors/Patient Goals at Admission:  Personal Goals and Risk Factors at Admission - 08/15/20 0957       Core Components/Risk Factors/Patient Goals on Admission    Weight Management Yes;Weight Maintenance    Intervention Weight Management: Develop a combined nutrition and exercise program designed to reach desired caloric intake, while maintaining appropriate intake of nutrient and fiber, sodium and fats, and appropriate energy expenditure required for the weight goal.;Weight Management: Provide education and appropriate resources to help participant work on and attain dietary goals.    Admit Weight 168 lb 14.4 oz (76.6 kg)    Goal Weight: Short Term 168 lb (76.2 kg)    Goal Weight: Long Term 168 lb (76.2 kg)    Expected Outcomes Short Term: Continue to assess and modify interventions until short term weight is achieved;Long Term: Adherence to nutrition and physical activity/exercise program aimed toward attainment of established weight goal;Weight Maintenance: Understanding of the daily nutrition guidelines, which includes 25-35% calories from fat, 7% or less cal from saturated fats, less than  cholesterol, less than 1.5gm of sodium, & 5 or more servings of fruits and vegetables daily    Intervention Provide a combined exercise and nutrition program that is  supplemented with education, support and counseling about heart failure. Directed toward relieving symptoms such as shortness of breath, decreased exercise tolerance, and extremity edema.    Expected Outcomes Improve functional capacity of life;Short term: Attendance in program 2-3 days a week  with increased exercise capacity. Reported lower sodium intake. Reported increased fruit and vegetable intake. Reports medication compliance.;Short term: Daily weights obtained and reported for increase. Utilizing diuretic protocols set by physician.;Long term: Adoption of self-care skills and reduction of barriers for early signs and symptoms recognition and intervention leading to self-care maintenance.    Hypertension Yes    Intervention Provide education on lifestyle modifcations including regular physical activity/exercise, weight management, moderate sodium restriction and increased consumption of fresh fruit, vegetables, and low fat dairy, alcohol moderation, and smoking cessation.;Monitor prescription use compliance.    Expected Outc34mmes Short Term: Continued assessment and intervention until BP is < 140/90mm HG in hypertensive participants. < 130/81mm HG in hypertensive participants with diabetes, heart failure or chronic kidney disease.;Long Term: Maintenance of blood pressure at goal levels.    Lipids Yes    Intervention Provide education and support for participant on nutrition & aerobic/resistive exercise along with prescribed medications to achieve LDL 70mg , HDL >40mg .    Expected Outcomes Short Term: Participant states understanding of desired cholesterol values and is compliant with medications prescribed. Participant is following exercise prescription and nutrition guidelines.;Long Term: Cholesterol controlled with medications as prescribed, with individualized exercise RX and with personalized nutrition plan. Value goals: LDL < , HDL > 40 mg.             Education:Diabetes - Individual verbal and written instruction to review signs/symptoms of diabetes, desired ranges of glucose level fasting, after meals and with exercise. Acknowledge that pre and post exercise glucose checks will be done for 3 sessions at entry of program.   Core Components/Risk Factors/Patient Goals  Review:    Core Components/Risk Factors/Patient Goals at Discharge (Final Review):    ITP Comments:  ITP Comments     Row Name 08/14/20 1052 08/15/20 0944         ITP Comments Initial telephone orientation completed. Diagnosis can be found in [place] records in CE. EP orientation scheduled for Thursday 8/4 at 8am. Completed and gym orientation. Initial ITP created and sent for review to Dr. Bethann Punches, Medical Director.               Comments: Initial ITP

## 2020-08-19 ENCOUNTER — Other Ambulatory Visit: Payer: Self-pay

## 2020-08-19 ENCOUNTER — Encounter: Payer: No Typology Code available for payment source | Admitting: *Deleted

## 2020-08-19 DIAGNOSIS — I5022 Chronic systolic (congestive) heart failure: Secondary | ICD-10-CM

## 2020-08-19 NOTE — Progress Notes (Addendum)
Daily Session Note  Patient Details  Name: Peter Frey MRN: [id number] Date of Birth: [birth year] Referring Provider:   Flowsheet Row Cardiac Rehab from 08/15/2020 in [facility]  Referring Provider Teena Dunk MD       Encounter Date: 08/19/2020  Check In:  Session Check In - 08/19/20 0934       Check-In   Supervising physician immediately available to respond to emergencies See telemetry face sheet for immediately available ER MD    Location [facility]-Cardiac & Pulmonary Rehab    Staff Present Heath Lark, RN, BSN, [place]aveda Norman, BS, ACSM CEP, Exercise Physiologist;Amanda Oletta Darter, [place], ACSM CEP, Exercise Physiologist    Virtual Visit No    Medication changes reported     No    Fall or balance concerns reported    No    Warm-up and Cool-down Performed on first and last piece of equipment    Resistance Training Performed Yes    VAD Patient? No    PAD/SET Patient? No      Pain Assessment   Currently in Pain? No/denies                Social History   Tobacco Use  Smoking Status Former   Packs/day: 2.00   Years: 43.00   Pack years: 86.00   Types: Cigarettes   Quit date: 2015   Years since quitting: 7.6  Smokeless Tobacco Never    Goals Met:  Independence with exercise equipment Exercise tolerated well Personal goals reviewed No report of cardiac concerns or symptoms  Goals Unmet:  Not Applicable  Comments: Pt able to follow exercise prescription today without complaint.  Will continue to monitor for progression. First full day of exercise!  Patient was oriented to gym and equipment including functions, settings, policies, and procedures.  Patient's individual exercise prescription and treatment plan were reviewed.  All starting workloads were established based on the results of the 6 minute walk test done at initial orientation visit.  The plan for exercise progression was also introduced and progression will be customized based on patient's  performance and goals.    Dr. Emily Filbert is Medical Director for [place].  Dr. Ottie Glazier is Medical Director for [facility] Pulmonary Rehabilitation.

## 2020-08-21 ENCOUNTER — Encounter: Payer: Self-pay | Admitting: *Deleted

## 2020-08-21 ENCOUNTER — Other Ambulatory Visit: Payer: Self-pay

## 2020-08-21 DIAGNOSIS — I5022 Chronic systolic (congestive) heart failure: Secondary | ICD-10-CM | POA: Diagnosis not present

## 2020-08-21 NOTE — Progress Notes (Signed)
Cardiac Individual Treatment Plan  Patient Details  Name: Peter Frey MRN: [id number] Date of Birth: 10/22/1950 Referring Provider:   Flowsheet Row Cardiac Rehab from 08/15/2020 in [facility] Cardiac and Pulmonary Rehab  Referring Provider Constance Haw MD       Initial Encounter Date:  Flowsheet Row Cardiac Rehab from 08/15/2020 in [facility] Cardiac and Pulmonary Rehab  Date 08/15/20       Visit Diagnosis: Heart failure, chronic systolic (HCC)  Patient's Home Medications on Admission:  Current Outpatient Medications:    acetaminophen (TYLENOL) 500 MG tablet, Take 500 mg by mouth every 6 (six) hours as needed., Disp: , Rfl:    albuterol (VENTOLIN HFA) 108 (90 Base) MCG/ACT inhaler, Inhale into the lungs., Disp: , Rfl:    aspirin 81 MG tablet, Take 81 mg by mouth daily., Disp: , Rfl:    atorvastatin (LIPITOR) 80 MG tablet, TAKE ONE-HALF TABLET BY MOUTH AT BEDTIME FOR HEART AND CHOLESTEROL., Disp: , Rfl:    empagliflozin (JARDIANCE) 25 MG TABS tablet, Take 0.5 tablets by mouth daily., Disp: , Rfl:    ezetimibe (ZETIA) 10 MG tablet, Take 1 tablet by mouth daily., Disp: , Rfl:    furosemide (LASIX) 40 MG tablet, Take 40 mg by mouth daily. , Disp: , Rfl:    gabapentin (NEURONTIN) 300 MG capsule, Take 300 mg by mouth 3 (three) times daily., Disp: , Rfl:    losartan (COZAAR) 25 MG tablet, Take 25 mg by mouth daily. , Disp: , Rfl:    metoprolol (LOPRESSOR) 50 MG tablet, Take 50 mg by mouth 2 (two) times daily. (Patient not taking: Reported on 08/14/2020), Disp: , Rfl:    metoprolol succinate (TOPROL-XL) 100 MG 24 hr tablet, TAKE ONE-HALF TABLET BY MOUTH EVERY DAY FOR HEART AND BLOOD PRESSURE., Disp: , Rfl:    sildenafil (VIAGRA) 50 MG tablet, Take by mouth as needed.  (Patient not taking: Reported on 08/14/2020), Disp: , Rfl:    simvastatin (ZOCOR) 20 MG tablet, Take 20 mg by mouth daily. (Patient not taking: Reported on 08/14/2020), Disp: , Rfl:    spironolactone (ALDACTONE) 25 MG tablet, Take 25 mg by mouth  daily. , Disp: , Rfl:    TIOTROPIUM BROMIDE-OLODATEROL IN, Inhale 2 Inhalers into the lungs daily., Disp: , Rfl:   Past Medical History: Past Medical History:  Diagnosis Date   CHF (congestive heart failure) (HCC)    COPD (chronic obstructive pulmonary disease) (HCC)    Coronary artery disease    MI (myocardial infarction) (HCC)     Tobacco Use: Social History   Tobacco Use  Smoking Status Former   Packs/day: 2.00   Years: 43.00   Pack years: 86.00   Types: Cigarettes   Quit date: 2015   Years since quitting: 7.6  Smokeless Tobacco Never    Labs: Recent Review Geneticist, molecular for ITP Cardiac and Pulmonary Rehab Latest Ref Rng & Units 10/18/2018   Cholestrol 0 - 200 mg/dL 94   LDLCALC 0 - 99 mg/dL 29   HDL >17 mg/dL 50   Trlycerides <616 mg/dL 73        Exercise Target Goals: Exercise Program Goal: Individual exercise prescription set using results from initial 6 min walk test and THRR while considering  patient's activity barriers and safety.   Exercise Prescription Goal: Initial exercise prescription builds to 30-45 minutes a day of aerobic activity, 2-3 days per week.  Home exercise guidelines will be given to patient during program as part  of exercise prescription that the participant will acknowledge.   Education: Aerobic Exercise: - Group verbal and visual presentation on the components of exercise prescription. Introduces F.I.T.T principle from ACSM for exercise prescriptions.  Reviews F.I.T.T. principles of aerobic exercise including progression. Written material given at graduation. Flowsheet Row Cardiac Rehab from 12/15/2018 in [facility] Cardiac and Pulmonary Rehab  Date 11/17/18  Educator jh  Instruction Review Code 1- Verbalizes Understanding       Education: Resistance Exercise: - Group verbal and visual presentation on the components of exercise prescription. Introduces F.I.T.T principle from ACSM for exercise prescriptions  Reviews F.I.T.T.  principles of resistance exercise including progression. Written material given at graduation.    Education: Exercise & Equipment Safety: - Individual verbal instruction and demonstration of equipment use and safety with use of the equipment. Flowsheet Row Cardiac Rehab from 08/15/2020 in [facility] Cardiac and Pulmonary Rehab  Date 08/15/20  Educator [facility]  Instruction Review Code 1- Verbalizes Understanding       Education: Exercise Physiology & General Exercise Guidelines: - Group verbal and written instruction with models to review the exercise physiology of the cardiovascular system and associated critical values. Provides general exercise guidelines with specific guidelines to those with heart or lung disease.    Education: Flexibility, Balance, Mind/Body Relaxation: - Group verbal and visual presentation with interactive activity on the components of exercise prescription. Introduces F.I.T.T principle from ACSM for exercise prescriptions. Reviews F.I.T.T. principles of flexibility and balance exercise training including progression. Also discusses the mind body connection.  Reviews various relaxation techniques to help reduce and manage stress (i.e. Deep breathing, progressive muscle relaxation, and visualization). Balance handout provided to take home. Written material given at graduation. Flowsheet Row Cardiac Rehab from 12/15/2018 in [facility] Cardiac and Pulmonary Rehab  Date 12/15/18  Educator AS  Instruction Review Code 1- Verbalizes Understanding       Activity Barriers & Risk Stratification:  Activity Barriers & Cardiac Risk Stratification - 08/15/20 0953       Activity Barriers & Cardiac Risk Stratification   Activity Barriers Shortness of Breath;Deconditioning;Muscular Weakness;Other (comment)    Comments legs burn/cramp with walking (PAD)    Cardiac Risk Stratification High             6 Minute Walk:  6 Minute Walk     Row Name 08/15/20 0945         6 Minute Walk    Phase Initial     Distance 1200 feet     Walk Time 6 minutes     # of Rest Breaks 0     MPH 2.27     METS 3.04     RPE 10     Perceived Dyspnea  1     VO2 Peak 10.63     Symptoms Yes (comment)     Comments bilateral legs buring 4/10, SOB     Resting HR 65 bpm     Resting BP 132/72     Resting Oxygen Saturation  98 %     Exercise Oxygen Saturation  during 6 min walk 98 %     Max Ex. HR 78 bpm     Max Ex. BP 136/74     2 Minute Post BP 128/70              Oxygen Initial Assessment:   Oxygen Re-Evaluation:   Oxygen Discharge (Final Oxygen Re-Evaluation):   Initial Exercise Prescription:  Initial Exercise Prescription - 08/15/20 0900  Date of Initial Exercise RX and Referring Provider   Date 08/15/20    Referring Provider Constance Haw MD      Treadmill   MPH 2.2    Grade 1    Minutes 15    METs 2.99      Arm Ergometer   Level 2    Watts 40    RPM 30    Minutes 15    METs 2      REL-XR   Level 2    Speed 50    Minutes 15    METs 2      T5 Nustep   Level 3    SPM 80    Minutes 15    METs 2      Track   Laps 31    Minutes 15    METs 2.69      Prescription Details   Frequency (times per week) 3    Duration Progress to 30 minutes of continuous aerobic without signs/symptoms of physical distress      Intensity   THRR 40-80% of Max Heartrate 100-135    Ratings of Perceived Exertion 11-13    Perceived Dyspnea 0-4      Progression   Progression Continue to progress workloads to maintain intensity without signs/symptoms of physical distress.      Resistance Training   Training Prescription Yes    Weight 4 lb    Reps 10-15             Perform Capillary Blood Glucose checks as needed.  Exercise Prescription Changes:   Exercise Prescription Changes     Row Name 08/15/20 0900 08/19/20 1600           Response to Exercise   Blood Pressure (Admit) 132/76 100/58      Blood Pressure (Exercise) 136/74 122/80      Blood Pressure  (Exit) 128/70 114/68      Heart Rate (Admit) 65 bpm 71 bpm      Heart Rate (Exercise) 78 bpm 100 bpm      Heart Rate (Exit) 66 bpm 81 bpm      Oxygen Saturation (Admit) 98 % --      Oxygen Saturation (Exercise) 98 % --      Rating of Perceived Exertion (Exercise) 10 14      Perceived Dyspnea (Exercise) 1 --      Symptoms legs buring 4/10, SOB none      Comments walk test results first full day of exercise      Duration -- Progress to 30 minutes of  aerobic without signs/symptoms of physical distress      Intensity -- THRR unchanged             Progression      Progression -- Continue to progress workloads to maintain intensity without signs/symptoms of physical distress.      Average METs -- 2.55             Resistance Training      Training Prescription -- Yes      Weight -- 4 lb      Reps -- 10-15             Interval Training      Interval Training -- No             Recumbant Bike      Level -- 1.4      Minutes -- 15  METs -- 3             NuStep      Level -- 3      Minutes -- 15      METs -- 2.1              Exercise Comments:   Exercise Comments     Row Name 08/19/20 1020           Exercise Comments First full day of exercise!  Patient was oriented to gym and equipment including functions, settings, policies, and procedures.  Patient's individual exercise prescription and treatment plan were reviewed.  All starting workloads were established based on the results of the 6 minute walk test done at initial orientation visit.  The plan for exercise progression was also introduced and progression will be customized based on patient's performance and goals.                Exercise Goals and Review:   Exercise Goals     Row Name 08/15/20 0955             Exercise Goals   Increase Physical Activity Yes       Intervention Provide advice, education, support and counseling about physical activity/exercise needs.;Develop an individualized  exercise prescription for aerobic and resistive training based on initial evaluation findings, risk stratification, comorbidities and participant's personal goals.       Expected Outcomes Short Term: Attend rehab on a regular basis to increase amount of physical activity.;Long Term: Add in home exercise to make exercise part of routine and to increase amount of physical activity.;Long Term: Exercising regularly at least 3-5 days a week.       Increase Strength and Stamina Yes       Intervention Provide advice, education, support and counseling about physical activity/exercise needs.;Develop an individualized exercise prescription for aerobic and resistive training based on initial evaluation findings, risk stratification, comorbidities and participant's personal goals.       Expected Outcomes Short Term: Increase workloads from initial exercise prescription for resistance, speed, and METs.;Short Term: Perform resistance training exercises routinely during rehab and add in resistance training at home;Long Term: Improve cardiorespiratory fitness, muscular endurance and strength as measured by increased METs and functional capacity ( )       Able to understand and use rate of perceived exertion (RPE) scale Yes       Intervention Provide education and explanation on how to use RPE scale       Expected Outcomes Short Term: Able to use RPE daily in rehab to express subjective intensity level;Long Term:  Able to use RPE to guide intensity level when exercising independently       Able to understand and use Dyspnea scale Yes       Intervention Provide education and explanation on how to use Dyspnea scale       Expected Outcomes Short Term: Able to use Dyspnea scale daily in rehab to express subjective sense of shortness of breath during exertion;Long Term: Able to use Dyspnea scale to guide intensity level when exercising independently       Knowledge and understanding of Target Heart Rate Range (THRR) Yes        Intervention Provide education and explanation of THRR including how the numbers were predicted and where they are located for reference       Expected Outcomes Short Term: Able to state/look up THRR;Short Term: Able to use daily as guideline for intensity in  rehab;Long Term: Able to use THRR to govern intensity when exercising independently       Able to check pulse independently Yes       Intervention Provide education and demonstration on how to check pulse in carotid and radial arteries.;Review the importance of being able to check your own pulse for safety during independent exercise       Expected Outcomes Short Term: Able to explain why pulse checking is important during independent exercise;Long Term: Able to check pulse independently and accurately       Understanding of Exercise Prescription Yes       Intervention Provide education, explanation, and written materials on patient's individual exercise prescription       Expected Outcomes Short Term: Able to explain program exercise prescription;Long Term: Able to explain home exercise prescription to exercise independently                Exercise Goals Re-Evaluation :  Exercise Goals Re-Evaluation     Row Name 08/19/20 1020             Exercise Goal Re-Evaluation   Exercise Goals Review Able to understand and use rate of perceived exertion (RPE) scale;Able to understand and use Dyspnea scale;Knowledge and understanding of Target Heart Rate Range (THRR);Understanding of Exercise Prescription       Comments Reviewed RPE and dyspnea scales, THR and program prescription with pt today.  Pt voiced understanding and was given a copy of goals to take home.       Expected Outcomes Short: Use RPE daily to regulate intensity. Long: Follow program prescription in THR.                Discharge Exercise Prescription (Final Exercise Prescription Changes):  Exercise Prescription Changes - 08/19/20 1600       Response to Exercise    Blood Pressure (Admit) 100/58    Blood Pressure (Exercise) 122/80    Blood Pressure (Exit) 114/68    Heart Rate (Admit) 71 bpm    Heart Rate (Exercise) 100 bpm    Heart Rate (Exit) 81 bpm    Rating of Perceived Exertion (Exercise) 14    Symptoms none    Comments first full day of exercise    Duration Progress to 30 minutes of  aerobic without signs/symptoms of physical distress    Intensity THRR unchanged      Progression   Progression Continue to progress workloads to maintain intensity without signs/symptoms of physical distress.    Average METs 2.55      Resistance Training   Training Prescription Yes    Weight 4 lb    Reps 10-15      Interval Training   Interval Training No      Recumbant Bike   Level 1.4    Minutes 15    METs 3      NuStep   Level 3    Minutes 15    METs 2.1             Nutrition:  Target Goals: Understanding of nutrition guidelines, daily intake of sodium 1500mg , cholesterol 200mg , calories 30% from fat and 7% or less from saturated fats, daily to have 5 or more servings of fruits and vegetables.  Education: All About Nutrition: -Group instruction provided by verbal, written material, interactive activities, discussions, models, and posters to present general guidelines for heart healthy nutrition including fat, fiber, MyPlate, the role of sodium in heart healthy nutrition, utilization of the nutrition label,  and utilization of this knowledge for meal planning. Follow up email sent as well. Written material given at graduation. Flowsheet Row Cardiac Rehab from 08/15/2020 in [facility] Cardiac and Pulmonary Rehab  Education need identified 08/15/20       Biometrics:  Pre Biometrics - 08/15/20 0955       Pre Biometrics   Height 6' (1.829 m)    Weight 168 lb 14.4 oz (76.6 kg)    BMI (Calculated) 22.9    Single Leg Stand 18.3 seconds              Nutrition Therapy Plan and Nutrition Goals:   Nutrition Assessments:  MEDIFICTS Score  Key: ?70 Need to make dietary changes  40-70 Heart Healthy Diet ? 40 Therapeutic Level Cholesterol Diet  Flowsheet Row Cardiac Rehab from 08/15/2020 in [facility] Cardiac and Pulmonary Rehab  Picture Your Plate Total Score on Admission 63      Picture Your Plate Scores: <51 Unhealthy dietary pattern with much room for improvement. 41-50 Dietary pattern unlikely to meet recommendations for good health and room for improvement. 51-60 More healthful dietary pattern, with some room for improvement.  >60 Healthy dietary pattern, although there may be some specific behaviors that could be improved.    Nutrition Goals Re-Evaluation:   Nutrition Goals Discharge (Final Nutrition Goals Re-Evaluation):   Psychosocial: Target Goals: Acknowledge presence or absence of significant depression and/or stress, maximize coping skills, provide positive support system. Participant is able to verbalize types and ability to use techniques and skills needed for reducing stress and depression.   Education: Stress, Anxiety, and Depression - Group verbal and visual presentation to define topics covered.  Reviews how body is impacted by stress, anxiety, and depression.  Also discusses healthy ways to reduce stress and to treat/manage anxiety and depression.  Written material given at graduation.   Education: Sleep Hygiene -Provides group verbal and written instruction about how sleep can affect your health.  Define sleep hygiene, discuss sleep cycles and impact of sleep habits. Review good sleep hygiene tips.    Initial Review & Psychosocial Screening:  Initial Psych Review & Screening - 08/14/20 1038       Initial Review   Current issues with None Identified      Family Dynamics   Good Support System? Yes   wife, sister, brother     Barriers   Psychosocial barriers to participate in program There are no identifiable barriers or psychosocial needs.;The patient should benefit from training in stress  management and relaxation.      Screening Interventions   Interventions Encouraged to exercise;Provide feedback about the scores to participant;To provide support and resources with identified psychosocial needs    Expected Outcomes Short Term goal: Utilizing psychosocial counselor, staff and physician to assist with identification of specific Stressors or current issues interfering with healing process. Setting desired goal for each stressor or current issue identified.;Long Term Goal: Stressors or current issues are controlled or eliminated.;Short Term goal: Identification and review with participant of any Quality of Life or Depression concerns found by scoring the questionnaire.;Long Term goal: The participant improves quality of Life and PHQ9 Scores as seen by post scores and/or verbalization of changes             Quality of Life Scores:   Quality of Life - 08/15/20 0956       Quality of Life   Select Quality of Life      Quality of Life Scores   Health/Function Pre 16.68 %  Socioeconomic Pre 19.5 %    Psych/Spiritual Pre 23.07 %    Family Pre 29.5 %    GLOBAL Pre 20.54 %            Scores of 19 and below usually indicate a poorer quality of life in these areas.  A difference of  2-3 points is a clinically meaningful difference.  A difference of 2-3 points in the total score of the Quality of Life Index has been associated with significant improvement in overall quality of life, self-image, physical symptoms, and general health in studies assessing change in quality of life.  PHQ-9: Recent Review Flowsheet Data     Depression screen [facility] 2/9 08/15/2020 08/29/2018   Decreased Interest 0 0   Down, Depressed, Hopeless 0 0   PHQ - 2 Score 0 0   Altered sleeping 0 0   Tired, decreased energy 1 3   Change in appetite 0 0   Feeling bad or failure about yourself  0 0   Trouble concentrating 0 0   Moving slowly or fidgety/restless 0 0   Suicidal thoughts 0 0   PHQ-9 Score 1  3   Difficult doing work/chores Somewhat difficult Not difficult at all      Interpretation of Total Score  Total Score Depression Severity:  1-4 = Minimal depression, 5-9 = Mild depression, 10-14 = Moderate depression, 15-19 = Moderately severe depression, 20-27 = Severe depression   Psychosocial Evaluation and Intervention:  Psychosocial Evaluation - 08/14/20 1043       Psychosocial Evaluation & Interventions   Interventions Encouraged to exercise with the program and follow exercise prescription    Comments Onalee Hua is returning to cardiac rehab for systolic heart failure. He states he has noticed a decrease in stamina and is looking forward to working at improving that. He reports no stress or concerns with depression. He is very thankful for his support system (wife, sister, brother) and sees each day as a blessing. He has been managing his heart failure symptoms pretty well and is interested in learning more about it.    Expected Outcomes Short: attend cardiac rehab for education and exercise. Long: develop and maintain positive self care habits.    Continue Psychosocial Services  Follow up required by staff             Psychosocial Re-Evaluation:   Psychosocial Discharge (Final Psychosocial Re-Evaluation):   Vocational Rehabilitation: Provide vocational rehab assistance to qualifying candidates.   Vocational Rehab Evaluation & Intervention:  Vocational Rehab - 08/14/20 1043       Initial Vocational Rehab Evaluation & Intervention   Assessment shows need for Vocational Rehabilitation No             Education: Education Goals: Education classes will be provided on a variety of topics geared toward better understanding of heart health and risk factor modification. Participant will state understanding/return demonstration of topics presented as noted by education test scores.  Learning Barriers/Preferences:  Learning Barriers/Preferences - 08/14/20 1043        Learning Barriers/Preferences   Learning Barriers None    Learning Preferences None             General Cardiac Education Topics:  AED/CPR: - Group verbal and written instruction with the use of models to demonstrate the basic use of the AED with the basic ABC's of resuscitation.   Anatomy and Cardiac Procedures: - Group verbal and visual presentation and models provide information about basic cardiac anatomy and function.  Reviews the testing methods done to diagnose heart disease and the outcomes of the test results. Describes the treatment choices: Medical Management, Angioplasty, or Coronary Bypass Surgery for treating various heart conditions including Myocardial Infarction, Angina, Valve Disease, and Cardiac Arrhythmias.  Written material given at graduation.   Medication Safety: - Group verbal and visual instruction to review commonly prescribed medications for heart and lung disease. Reviews the medication, class of the drug, and side effects. Includes the steps to properly store meds and maintain the prescription regimen.  Written material given at graduation.   Intimacy: - Group verbal instruction through game format to discuss how heart and lung disease can affect sexual intimacy. Written material given at graduation..   Know Your Numbers and Heart Failure: - Group verbal and visual instruction to discuss disease risk factors for cardiac and pulmonary disease and treatment options.  Reviews associated critical values for Overweight/Obesity, Hypertension, Cholesterol, and Diabetes.  Discusses basics of heart failure: signs/symptoms and treatments.  Introduces Heart Failure Zone chart for action plan for heart failure.  Written material given at graduation.   Infection Prevention: - Provides verbal and written material to individual with discussion of infection control including proper hand washing and proper equipment cleaning during exercise session. Flowsheet Row Cardiac  Rehab from 08/15/2020 in [facility] Cardiac and Pulmonary Rehab  Date 08/15/20  Educator [facility]  Instruction Review Code 1- Verbalizes Understanding       Falls Prevention: - Provides verbal and written material to individual with discussion of falls prevention and safety. Flowsheet Row Cardiac Rehab from 08/15/2020 in [facility] Cardiac and Pulmonary Rehab  Date 08/15/20  Educator [facility]  Instruction Review Code 1- Verbalizes Understanding       Other: -Provides group and verbal instruction on various topics (see comments)   Knowledge Questionnaire Score:  Knowledge Questionnaire Score - 08/15/20 0957       Knowledge Questionnaire Score   Pre Score 26/28 Education: Nutrition, Tobacco             Core Components/Risk Factors/Patient Goals at Admission:  Personal Goals and Risk Factors at Admission - 08/15/20 0957       Core Components/Risk Factors/Patient Goals on Admission    Weight Management Yes;Weight Maintenance    Intervention Weight Management: Develop a combined nutrition and exercise program designed to reach desired caloric intake, while maintaining appropriate intake of nutrient and fiber, sodium and fats, and appropriate energy expenditure required for the weight goal.;Weight Management: Provide education and appropriate resources to help participant work on and attain dietary goals.    Admit Weight 168 lb 14.4 oz (76.6 kg)    Goal Weight: Short Term 168 lb (76.2 kg)    Goal Weight: Long Term 168 lb (76.2 kg)    Expected Outcomes Short Term: Continue to assess and modify interventions until short term weight is achieved;Long Term: Adherence to nutrition and physical activity/exercise program aimed toward attainment of established weight goal;Weight Maintenance: Understanding of the daily nutrition guidelines, which includes 25-35% calories from fat, 7% or less cal from saturated fats, less than 200mg  cholesterol, less than 1.5gm of sodium, & 5 or more servings of fruits and  vegetables daily    Intervention Provide a combined exercise and nutrition program that is supplemented with education, support and counseling about heart failure. Directed toward relieving symptoms such as shortness of breath, decreased exercise tolerance, and extremity edema.    Expected Outcomes Improve functional capacity of life;Short term: Attendance in program 2-3 days a week with increased  exercise capacity. Reported lower sodium intake. Reported increased fruit and vegetable intake. Reports medication compliance.;Short term: Daily weights obtained and reported for increase. Utilizing diuretic protocols set by physician.;Long term: Adoption of self-care skills and reduction of barriers for early signs and symptoms recognition and intervention leading to self-care maintenance.    Hypertension Yes    Intervention Provide education on lifestyle modifcations including regular physical activity/exercise, weight management, moderate sodium restriction and increased consumption of fresh fruit, vegetables, and low fat dairy, alcohol moderation, and smoking cessation.;Monitor prescription use compliance.    Expected Outcomes Short Term: Continued assessment and intervention until BP is < 140/4490mm HG in hypertensive participants. < 130/4180mm HG in hypertensive participants with diabetes, heart failure or chronic kidney disease.;Long Term: Maintenance of blood pressure at goal levels.    Lipids Yes    Intervention Provide education and support for participant on nutrition & aerobic/resistive exercise along with prescribed medications to achieve LDL 70mg , HDL >40mg .    Expected Outcomes Short Term: Participant states understanding of desired cholesterol values and is compliant with medications prescribed. Participant is following exercise prescription and nutrition guidelines.;Long Term: Cholesterol controlled with medications as prescribed, with individualized exercise RX and with personalized nutrition plan.  Value goals: LDL < 70mg , HDL > 40 mg.             Education:Diabetes - Individual verbal and written instruction to review signs/symptoms of diabetes, desired ranges of glucose level fasting, after meals and with exercise. Acknowledge that pre and post exercise glucose checks will be done for 3 sessions at entry of program.   Core Components/Risk Factors/Patient Goals Review:    Core Components/Risk Factors/Patient Goals at Discharge (Final Review):    ITP Comments:  ITP Comments     Row Name 08/14/20 1052 08/15/20 0944 08/19/20 1019 08/21/20 0658     ITP Comments Initial telephone orientation completed. [place] [facility] [place]. EP orientation scheduled for Thursday 8/4 at 8am. Completed 6MWT and gym orientation. Initial ITP created and sent for review to Dr. Bethann PunchesMark Miller, Medical Director. First full day of exercise!  Patient was oriented to gym and equipment including functions, settings, policies, and procedures.  Patient's individual exercise prescription and treatment plan were reviewed.  All starting workloads were established based on the results of the 6 minute walk test done at initial orientation visit.  The plan for exercise progression was also introduced and progression will be customized based on patient's performance and goals. 30 Day review completed. Medical Director ITP review done, changes made as directed, and signed approval by Medical Director.     New to program             Comments:

## 2020-08-21 NOTE — Progress Notes (Signed)
Daily Session Note  Patient Details  Name: Peter Frey MRN: [id number] Date of Birth: Jul 29, 1950 Referring Provider:   Flowsheet Row Cardiac Rehab from 08/15/2020 in [facility] Cardiac and Pulmonary Rehab  Referring Provider Teena Dunk MD       Encounter Date: 08/21/2020  Check In:  Session Check In - 08/21/20 0943       Check-In   Supervising physician immediately available to respond to emergencies See telemetry face sheet for immediately available ER MD    Location [facility]-Cardiac & Pulmonary Rehab    Staff Present Birdie Sons, MPA, Nino Glow, MS, ASCM CEP, Exercise Physiologist;Amanda Oletta Darter, BA, ACSM CEP, Exercise Physiologist    Virtual Visit No    Medication changes reported     No    Fall or balance concerns reported    No    Warm-up and Cool-down Performed on first and last piece of equipment    Resistance Training Performed Yes    VAD Patient? No    PAD/SET Patient? No      Pain Assessment   Currently in Pain? No/denies                Social History   Tobacco Use  Smoking Status Former   Packs/day: 2.00   Years: 43.00   Pack years: 86.00   Types: Cigarettes   Quit date: 2015   Years since quitting: 7.6  Smokeless Tobacco Never    Goals Met:  Independence with exercise equipment Exercise tolerated well No report of cardiac concerns or symptoms Strength training completed today  Goals Unmet:  Not Applicable  Comments: Pt able to follow exercise prescription today without complaint.  Will continue to monitor for progression.    Dr. Emily Filbert is Medical Director for [place].  Dr. Ottie Glazier is Medical Director for [facility] Pulmonary Rehabilitation.

## 2020-08-23 ENCOUNTER — Other Ambulatory Visit: Payer: Self-pay

## 2020-08-23 ENCOUNTER — Encounter: Payer: No Typology Code available for payment source | Admitting: *Deleted

## 2020-08-23 DIAGNOSIS — I5022 Chronic systolic (congestive) heart failure: Secondary | ICD-10-CM | POA: Diagnosis not present

## 2020-08-23 NOTE — Progress Notes (Signed)
Daily Session Note  Patient Details  Name: Peter Frey MRN: [id number] Date of Birth: [birth year] Referring Provider:   Flowsheet Row Cardiac Rehab from 08/15/2020 in [facility] Cardiac and Pulmonary Rehab  Referring Provider Teena Dunk MD       Encounter Date: 08/23/2020  Check In:  Session Check In - 08/23/20 0918       Check-In   Supervising physician immediately available to respond to emergencies See telemetry face sheet for immediately available ER MD    Location [facility]-Cardiac & Pulmonary Rehab    Staff Present Heath Lark, RN, BSN, CCRP;Jessica [place], MA, RCEP, CCRP, CCET;Melissa [place], [place], LDN    Virtual Visit No    Medication changes reported     No    Fall or balance concerns reported    No    Warm-up and Cool-down Performed on first and last piece of equipment    Resistance Training Performed Yes    VAD Patient? No    PAD/SET Patient? No      Pain Assessment   Currently in Pain? No/denies                Social History   Tobacco Use  Smoking Status Former   Packs/day: 2.00   Years: 43.00   Pack years: 86.00   Types: Cigarettes   Quit date: 2015   Years since quitting: 7.6  Smokeless Tobacco Never    Goals Met:  Independence with exercise equipment Exercise tolerated well No report of cardiac concerns or symptoms  Goals Unmet:  Not Applicable  Comments: Pt able to follow exercise prescription today without complaint.  Will continue to monitor for progression.    Dr. Emily Filbert is Medical Director for [place].  Dr. Ottie Glazier is Medical Director for [facility] Pulmonary Rehabilitation.

## 2020-08-26 ENCOUNTER — Encounter: Payer: No Typology Code available for payment source | Admitting: *Deleted

## 2020-08-26 ENCOUNTER — Other Ambulatory Visit: Payer: Self-pay

## 2020-08-26 DIAGNOSIS — I5022 Chronic systolic (congestive) heart failure: Secondary | ICD-10-CM

## 2020-08-26 NOTE — Progress Notes (Signed)
Daily Session Note  Patient Details  Name: Peter Frey MRN: [id number] Date of Birth: 07/25/1950 Referring Provider:   Flowsheet Row Cardiac Rehab from 08/15/2020 in [facility] Cardiac and Pulmonary Rehab  Referring Provider Teena Dunk MD       Encounter Date: 08/26/2020  Check In:  Session Check In - 08/26/20 0946       Check-In   Supervising physician immediately available to respond to emergencies See telemetry face sheet for immediately available ER MD    Location [facility]Cardiac & Pulmonary Rehab    Staff Present Heath Lark, RN, BSN, Laveda Norman, BS, ACSM CEP, Exercise Physiologist;Joseph [place], [place]    Virtual Visit No    Medication changes reported     No    Fall or balance concerns reported    No    Warm-up and Cool-down Performed on first and last piece of equipment    Resistance Training Performed Yes    VAD Patient? No    PAD/SET Patient? No      Pain Assessment   Currently in Pain? No/denies                Social History   Tobacco Use  Smoking Status Former   Packs/day: 2.00   Years: 43.00   Pack years: 86.00   Types: Cigarettes   Quit date: 2015   Years since quitting: 7.6  Smokeless Tobacco Never    Goals Met:  Independence with exercise equipment Exercise tolerated well No report of cardiac concerns or symptoms  Goals Unmet:  Not Applicable  Comments: Pt able to follow exercise prescription today without complaint.  Will continue to monitor for progression.    Dr. Emily Filbert is Medical Director for [place].  Dr. Ottie Glazier is Medical Director for [facility] Pulmonary Rehabilitation.

## 2020-08-28 ENCOUNTER — Other Ambulatory Visit: Payer: Self-pay

## 2020-08-28 DIAGNOSIS — I5022 Chronic systolic (congestive) heart failure: Secondary | ICD-10-CM

## 2020-08-28 NOTE — Progress Notes (Signed)
Daily Session Note  Patient Details  Name: Peter Frey MRN: [id number] Date of Birth: 12-12-1950 Referring Provider:   Flowsheet Row Cardiac Rehab from 08/15/2020 in [facility] Cardiac and Pulmonary Rehab  Referring Provider Teena Dunk MD       Encounter Date: 08/28/2020  Check In:  Session Check In - 08/28/20 0925       Check-In   Supervising physician immediately available to respond to emergencies See telemetry face sheet for immediately available ER MD    Location [facility]Cardiac & Pulmonary Rehab    Staff Present Birdie Sons, MPA, RN;Laureen Owens Shark, BS, RRT, CPFT;Kara Eliezer Bottom, MS, ASCM CEP, Exercise Physiologist    Virtual Visit No    Medication changes reported     No    Fall or balance concerns reported    No    Warm-up and Cool-down Performed on first and last piece of equipment    Resistance Training Performed Yes    VAD Patient? No    PAD/SET Patient? No      Pain Assessment   Currently in Pain? No/denies                Social History   Tobacco Use  Smoking Status Former   Packs/day: 2.00   Years: 43.00   Pack years: 86.00   Types: Cigarettes   Quit date: 2015   Years since quitting: 7.6  Smokeless Tobacco Never    Goals Met:  Independence with exercise equipment Exercise tolerated well No report of cardiac concerns or symptoms Strength training completed today  Goals Unmet:  Not Applicable  Comments: Pt able to follow exercise prescription today without complaint.  Will continue to monitor for progression.    Dr. Emily Filbert is Medical Director for [place].  Dr. Ottie Glazier is Medical Director for [facility] Pulmonary Rehabilitation.

## 2020-08-30 ENCOUNTER — Other Ambulatory Visit: Payer: Self-pay

## 2020-08-30 ENCOUNTER — Encounter: Payer: No Typology Code available for payment source | Admitting: *Deleted

## 2020-08-30 DIAGNOSIS — I5022 Chronic systolic (congestive) heart failure: Secondary | ICD-10-CM

## 2020-08-30 NOTE — Progress Notes (Signed)
Daily Session Note  Patient Details  Name: Peter Frey MRN: [id number] Date of Birth: Apr 28, 1950 Referring Provider:   Flowsheet Row Cardiac Rehab from 08/15/2020 in [facility] Cardiac and Pulmonary Rehab  Referring Provider Teena Dunk MD       Encounter Date: 08/30/2020  Check In:  Session Check In - 08/30/20 0917       Check-In   Supervising physician immediately available to respond to emergencies See telemetry face sheet for immediately available ER MD    Location [facility]Cardiac & Pulmonary Rehab    Staff Present Renita Papa, RN BSN;Joseph Tessie Fass, RCP,RRT,BSRT;Kelly [place], MPA, RN    Virtual Visit No    Medication changes reported     No    Fall or balance concerns reported    No    Warm-up and Cool-down Performed on first and last piece of equipment    Resistance Training Performed Yes    VAD Patient? No    PAD/SET Patient? No      Pain Assessment   Currently in Pain? No/denies                Social History   Tobacco Use  Smoking Status Former   Packs/day: 2.00   Years: 43.00   Pack years: 86.00   Types: Cigarettes   Quit date: 2015   Years since quitting: 7.6  Smokeless Tobacco Never    Goals Met:  Independence with exercise equipment Exercise tolerated well No report of cardiac concerns or symptoms Strength training completed today  Goals Unmet:  Not Applicable  Comments: Pt able to follow exercise prescription today without complaint.  Will continue to monitor for progression.    Dr. Emily Filbert is Medical Director for [place].  Dr. Ottie Glazier is Medical Director for [facility] Pulmonary Rehabilitation.

## 2020-09-02 ENCOUNTER — Other Ambulatory Visit: Payer: Self-pay

## 2020-09-02 ENCOUNTER — Encounter: Payer: No Typology Code available for payment source | Admitting: *Deleted

## 2020-09-02 DIAGNOSIS — I5022 Chronic systolic (congestive) heart failure: Secondary | ICD-10-CM

## 2020-09-02 NOTE — Progress Notes (Signed)
Daily Session Note  Patient Details  Name: Peter Frey MRN: [id number] Date of Birth: 04/20/1950 Referring Provider:   Flowsheet Row Cardiac Rehab from 08/15/2020 in [facility] Cardiac and Pulmonary Rehab  Referring Provider Sun, [place] MD       Encounter Date: 09/02/2020  Check In:  Session Check In - 09/02/20 1012       Check-In   Supervising physician immediately available to respond to emergencies See telemetry face sheet for immediately available ER MD    Location [facility] Pulmonary Rehab    Staff Present Susanne Bice, RN, BSN, CCRP;Kelly Hayes, BS, ACSM CEP, Exercise Physiologist;Joseph [place], RCP,RRT,BSRT    Virtual Visit No    Medication changes reported     No    Fall or balance concerns reported    No    Warm-up and Cool-down Performed on first and last piece of equipment    Resistance Training Performed Yes    VAD Patient? No    PAD/SET Patient? No      Pain Assessment   Currently in Pain? No/denies                Social History   Tobacco Use  Smoking Status Former   Packs/day: 2.00   Years: 43.00   Pack years: 86.00   Types: Cigarettes   Quit date: 2015   Years since quitting: 7.6  Smokeless Tobacco Never    Goals Met:  Independence with exercise equipment Exercise tolerated well No report of cardiac concerns or symptoms  Goals Unmet:  Not Applicable  Comments: Pt able to follow exercise prescription today without complaint.  Will continue to monitor for progression.    Dr. Mark Miller is Medical Director for [facility] Cardiac Rehabilitation.  Dr. Fuad Aleskerov is Medical Director for [facility] Pulmonary Rehabilitation. 

## 2020-09-02 NOTE — Progress Notes (Signed)
Completed initial consultation 

## 2020-09-04 ENCOUNTER — Other Ambulatory Visit: Payer: Self-pay

## 2020-09-04 DIAGNOSIS — I5022 Chronic systolic (congestive) heart failure: Secondary | ICD-10-CM

## 2020-09-04 DIAGNOSIS — I429 Cardiomyopathy, unspecified: Secondary | ICD-10-CM

## 2020-09-04 NOTE — Progress Notes (Signed)
Daily Session Note  Patient Details  Name: Peter Frey MRN: [id number] Date of Birth: June 16, 1950 Referring Provider:   Flowsheet Row Cardiac Rehab from 08/15/2020 in [facility] Cardiac and Pulmonary Rehab  Referring Provider Teena Dunk MD       Encounter Date: 09/04/2020  Check In:  Session Check In - 09/04/20 0927       Check-In   Supervising physician immediately available to respond to emergencies See telemetry face sheet for immediately available ER MD    Location [facility]Card[facility] & Pulmonary Rehab    Staff Present Birdie Sons, MPA, RN;Melissa Casper, RDN, Rowe Pavy, BA, ACSM CEP, Exercise Physiologist;Joseph Tessie Fass, [place]    Virtual Visit No    Medication changes reported     No    Fall or balance concerns reported    No    Warm-up and Cool-down Performed on first and last piece of equipment    Resistance Training Performed Yes    VAD Patient? No    PAD/SET Patient? No      Pain Assessment   Currently in Pain? No/denies                Social History   Tobacco Use  Smoking Status Former   Packs/day: 2.00   Years: 43.00   Pack years: 86.00   Types: Cigarettes   Quit date: 2015   Years since quitting: 7.6  Smokeless Tobacco Never    Goals Met:  Independence with exercise equipment Exercise tolerated well No report of cardiac concerns or symptoms Strength training completed today  Goals Unmet:  Not Applicable  Comments: Pt able to follow exercise prescription today without complaint.  Will continue to monitor for progression.    Dr. Emily Filbert is Medical Director for [place].  Dr. Ottie Glazier is Medical Director for [facility] Pulmonary Rehabilitation.

## 2020-09-06 ENCOUNTER — Encounter: Payer: No Typology Code available for payment source | Admitting: *Deleted

## 2020-09-06 ENCOUNTER — Other Ambulatory Visit: Payer: Self-pay

## 2020-09-06 DIAGNOSIS — I5022 Chronic systolic (congestive) heart failure: Secondary | ICD-10-CM

## 2020-09-06 DIAGNOSIS — I429 Cardiomyopathy, unspecified: Secondary | ICD-10-CM

## 2020-09-06 NOTE — Progress Notes (Signed)
Daily Session Note  Patient Details  Name: Peter Frey MRN: [id number] Date of Birth: 04/02/1950 Referring Provider:   Flowsheet Row Cardiac Rehab from 08/15/2020 in [facility] Cardiac and Pulmonary Rehab  Referring Provider Teena Dunk MD       Encounter Date: 09/06/2020  Check In:  Session Check In - 09/06/20 0918       Check-In   Supervising physician immediately available to respond to emergencies See telemetry face sheet for immediately available ER MD    Location [facility]Cardiac & Pulmonary Rehab    Staff Present Renita Papa, RN BSN;Joseph [place], RCP,RRT,BSRT;Jessica [place], [place], RCEP, CCRP, CCET    Virtual Visit No    Medication changes reported     No    Fall or balance concerns reported    No    Warm-up and Cool-down Performed on first and last piece of equipment    Resistance Training Performed Yes    VAD Patient? No    PAD/SET Patient? No      Pain Assessment   Currently in Pain? No/denies                Social History   Tobacco Use  Smoking Status Former   Packs/day: 2.00   Years: 43.00   Pack years: 86.00   Types: Cigarettes   Quit date: 2015   Years since quitting: 7.6  Smokeless Tobacco Never    Goals Met:  Independence with exercise equipment Exercise tolerated well No report of concerns or symptoms today Strength training completed today  Goals Unmet:  Not Applicable  Comments: Pt able to follow exercise prescription today without complaint.  Will continue to monitor for progression.    Dr. Emily Filbert is Medical Director for [place].  Dr. Ottie Glazier is Medical Director for [facility] Pulmonary Rehabilitation.

## 2020-09-09 ENCOUNTER — Other Ambulatory Visit: Payer: Self-pay

## 2020-09-09 ENCOUNTER — Encounter: Payer: No Typology Code available for payment source | Admitting: *Deleted

## 2020-09-09 DIAGNOSIS — I5022 Chronic systolic (congestive) heart failure: Secondary | ICD-10-CM

## 2020-09-09 NOTE — Progress Notes (Signed)
Daily Session Note  Patient Details  Name: Peter Frey MRN: [id number] Date of Birth: 1950-02-22 Referring Provider:   Flowsheet Row Card[facility] Rehab [facility]15/2020 in [facility] Cardiac and Pulmonary Rehab  Referring Provider Teena Dunk MD       Encounter Date: 09/09/2020  Check In:  Session Check In - 09/09/20 1000       Check-In   Supervising physician immediately available to respond to emergencies See telemetry face sheet for immediately available ER MD    Location [facility]Card[facility] Pulmonary Rehab    Staff Present Heath Lark, RN, BSN, Laveda Norman, BS, ACSM CEP, Exercise Physiologist;Joseph [place], [place]    Virtual Visit No    Medication changes reported     No    Fall or balance concerns reported    No    Warm-up and Cool-down Performed on first and last piece of equipment    Resistance Training Performed Yes    VAD Patient? No    PAD/SET Patient? No      Pain Assessment   Currently in Pain? No/denies                Social History   Tobacco Use  Smoking Status Former   Packs/day: 2.00   Years: 43.00   Pack years: 86.00   Types: Cigarettes   Quit date: 2015   Years since quitting: 7.6  Smokeless Tobacco Never    Goals Met:  Independence with exercise equipment Exercise tolerated well No report of concerns or symptoms today  Goals Unmet:  Not Applicable  Comments: Pt able to follow exercise prescription today without complaint.  Will continue to monitor for progression.    Dr. Emily Filbert is Medical Director for [place].  Dr. Ottie Glazier is Medical Director for [facility] Pulmonary Rehabilitation.

## 2020-09-11 ENCOUNTER — Other Ambulatory Visit: Payer: Self-pay

## 2020-09-11 DIAGNOSIS — I5022 Chronic systolic (congestive) heart failure: Secondary | ICD-10-CM | POA: Diagnosis not present

## 2020-09-11 NOTE — Progress Notes (Signed)
Daily Session Note  Patient Details  Name: Peter Frey MRN: [id number] Date of Birth: 01-22-1950 Referring Provider:   Flowsheet Row Cardiac Rehab from 08/15/2020 in [facility] Cardiac and Pulmonary Rehab  Referring Provider Teena Dunk MD       Encounter Date: 09/11/2020  Check In:  Session Check In - 09/11/20 0925       Check-In   Supervising physician immediately available to respond to emergencies See telemetry face sheet for immediately available ER MD    Location [facility]Cardiac [facility] Pulmonary Rehab    Staff Present Birdie Sons, MPA, Elveria Rising, BA, ACSM CEP, Exercise Physiologist;Joseph Tessie Fass, [place]    Virtual Visit No    Medication changes reported     No    Fall or balance concerns reported    No    Warm-up and Cool-down Performed on first and last piece of equipment    Resistance Training Performed Yes    VAD Patient? No    PAD/SET Patient? No      Pain Assessment   Currently in Pain? No/denies                Social History   Tobacco Use  Smoking Status Former   Packs/day: 2.00   Years: 43.00   Pack years: 86.00   Types: Cigarettes   Quit date: 2015   Years since quitting: 7.6  Smokeless Tobacco Never    Goals Met:  Independence with exercise equipment Exercise tolerated well No report of concerns or symptoms today Strength training completed today  Goals Unmet:  Not Applicable  Comments: Pt able to follow exercise prescription today without complaint.  Will continue to monitor for progression.    Dr. Emily Filbert is Medical Director for [place].  Dr. Ottie Glazier is Medical Director for [facility] Pulmonary Rehabilitation.

## 2020-09-13 ENCOUNTER — Other Ambulatory Visit: Payer: Self-pay

## 2020-09-13 ENCOUNTER — Encounter: Payer: No Typology Code available for payment source | Attending: Cardiology

## 2020-09-13 DIAGNOSIS — I5022 Chronic systolic (congestive) heart failure: Secondary | ICD-10-CM | POA: Insufficient documentation

## 2020-09-13 DIAGNOSIS — I429 Cardiomyopathy, unspecified: Secondary | ICD-10-CM | POA: Insufficient documentation

## 2020-09-13 NOTE — Progress Notes (Signed)
Daily Session Note  Patient Details  Name: BRIAR WITHERSPOON MRN: [id number] Date of Birth: [birth year] Referring Provider:   Flowsheet Row Cardiac Rehab from 08/15/2020 in [facility] Cardiac and Pulmonary Rehab  Referring Provider Teena Dunk MD       Encounter Date: 09/13/2020  Check In:  Session Check In - 09/13/20 0930       Check-In   Supervising physician immediately available to respond to emergencies See telemetry face sheet for immediately available ER MD    Location [facility]-Cardiac & Pulmonary Rehab    Staff Present Birdie Sons, MPA, RN;Jessica [place]ard, MA, RCEP, CCRP, CCET;Joseph [place], [place]    Virtual Visit No    Medication changes reported     No    Fall or balance concerns reported    No    Warm-up and Cool-down Performed on first and last piece of equipment    Resistance Training Performed Yes    VAD Patient? No    PAD/SET Patient? No      Pain Assessment   Currently in Pain? No/denies                Social History   Tobacco Use  Smoking Status Former   Packs/day: 2.00   Years: 43.00   Pack years: 86.00   Types: Cigarettes   Quit date: 2015   Years since quitting: 7.6  Smokeless Tobacco Never    Goals Met:  Independence with exercise equipment Exercise tolerated well No report of concerns or symptoms today Strength training completed today  Goals Unmet:  Not Applicable  Comments: Pt able to follow exercise prescription today without complaint.  Will continue to monitor for progression.  Reviewed home exercise with pt today.  Pt plans to walk and use treadmill at home for exercise.  We also talked about using staff videos for home use.  Reviewed THR, pulse, RPE, sign and symptoms, pulse oximetery and when to call 911 or MD.  Also discussed weather considerations and indoor options.  Pt voiced understanding.   Dr. Emily Filbert is Medical Director for [place].  Dr. Ottie Glazier is Medical Director for [facility]  Pulmonary Rehabilitation.

## 2020-09-18 ENCOUNTER — Other Ambulatory Visit: Payer: Self-pay

## 2020-09-18 ENCOUNTER — Encounter: Payer: Self-pay | Admitting: *Deleted

## 2020-09-18 ENCOUNTER — Encounter: Payer: No Typology Code available for payment source | Admitting: *Deleted

## 2020-09-18 DIAGNOSIS — I5022 Chronic systolic (congestive) heart failure: Secondary | ICD-10-CM

## 2020-09-18 NOTE — Progress Notes (Signed)
Cardiac Individual Treatment Plan  Patient Details  Name: Peter Frey MRN: [id number] Date of Birth: 10/22/1950 Referring Provider:   Flowsheet Row Cardiac Rehab from 08/15/2020 in [facility] Cardiac and Pulmonary Rehab  Referring Provider Constance Haw MD       Initial Encounter Date:  Flowsheet Row Cardiac Rehab from 08/15/2020 in [facility] Cardiac and Pulmonary Rehab  Date 08/15/20       Visit Diagnosis: Heart failure, chronic systolic (HCC)  Patient's Home Medications on Admission:  Current Outpatient Medications:    acetaminophen (TYLENOL) 500 MG tablet, Take 500 mg by mouth every 6 (six) hours as needed., Disp: , Rfl:    albuterol (VENTOLIN HFA) 108 (90 Base) MCG/ACT inhaler, Inhale into the lungs., Disp: , Rfl:    aspirin 81 MG tablet, Take 81 mg by mouth daily., Disp: , Rfl:    atorvastatin (LIPITOR) 80 MG tablet, TAKE ONE-HALF TABLET BY MOUTH AT BEDTIME FOR HEART AND CHOLESTEROL., Disp: , Rfl:    empagliflozin (JARDIANCE) 25 MG TABS tablet, Take 0.5 tablets by mouth daily., Disp: , Rfl:    ezetimibe (ZETIA) 10 MG tablet, Take 1 tablet by mouth daily., Disp: , Rfl:    furosemide (LASIX) 40 MG tablet, Take 40 mg by mouth daily. , Disp: , Rfl:    gabapentin (NEURONTIN) 300 MG capsule, Take 300 mg by mouth 3 (three) times daily., Disp: , Rfl:    losartan (COZAAR) 25 MG tablet, Take 25 mg by mouth daily. , Disp: , Rfl:    metoprolol (LOPRESSOR) 50 MG tablet, Take 50 mg by mouth 2 (two) times daily. (Patient not taking: Reported on 08/14/2020), Disp: , Rfl:    metoprolol succinate (TOPROL-XL) 100 MG 24 hr tablet, TAKE ONE-HALF TABLET BY MOUTH EVERY DAY FOR HEART AND BLOOD PRESSURE., Disp: , Rfl:    sildenafil (VIAGRA) 50 MG tablet, Take by mouth as needed.  (Patient not taking: Reported on 08/14/2020), Disp: , Rfl:    simvastatin (ZOCOR) 20 MG tablet, Take 20 mg by mouth daily. (Patient not taking: Reported on 08/14/2020), Disp: , Rfl:    spironolactone (ALDACTONE) 25 MG tablet, Take 25 mg by mouth  daily. , Disp: , Rfl:    TIOTROPIUM BROMIDE-OLODATEROL IN, Inhale 2 Inhalers into the lungs daily., Disp: , Rfl:   Past Medical History: Past Medical History:  Diagnosis Date   CHF (congestive heart failure) (HCC)    COPD (chronic obstructive pulmonary disease) (HCC)    Coronary artery disease    MI (myocardial infarction) (HCC)     Tobacco Use: Social History   Tobacco Use  Smoking Status Former   Packs/day: 2.00   Years: 43.00   Pack years: 86.00   Types: Cigarettes   Quit date: 2015   Years since quitting: 7.6  Smokeless Tobacco Never    Labs: Recent Review Geneticist, molecular for ITP Cardiac and Pulmonary Rehab Latest Ref Rng & Units 10/18/2018   Cholestrol 0 - 200 mg/dL 94   LDLCALC 0 - 99 mg/dL 29   HDL >17 mg/dL 50   Trlycerides <616 mg/dL 73        Exercise Target Goals: Exercise Program Goal: Individual exercise prescription set using results from initial 6 min walk test and THRR while considering  patient's activity barriers and safety.   Exercise Prescription Goal: Initial exercise prescription builds to 30-45 minutes a day of aerobic activity, 2-3 days per week.  Home exercise guidelines will be given to patient during program as part  of exercise prescription that the participant will acknowledge.   Education: Aerobic Exercise: - Group verbal and visual presentation on the components of exercise prescription. Introduces F.I.T.T principle from ACSM for exercise prescriptions.  Reviews F.I.T.T. principles of aerobic exercise including progression. Written material given at graduation. Flowsheet Row Cardiac Rehab from 09/04/2020 in [facility] Cardiac and Pulmonary Rehab  Date 09/04/20  Educator [facility]  Instruction Review Code 1- Verbalizes Understanding       Education: Resistance Exercise: - Group verbal and visual presentation on the components of exercise prescription. Introduces F.I.T.T principle from ACSM for exercise prescriptions  Reviews F.I.T.T.  principles of resistance exercise including progression. Written material given at graduation.    Education: Exercise & Equipment Safety: - Individual verbal instruction and demonstration of equipment use and safety with use of the equipment. Flowsheet Row Cardiac Rehab from 09/04/2020 in [facility] Cardiac and Pulmonary Rehab  Date 08/15/20  Educator [facility]  Instruction Review Code 1- Verbalizes Understanding       Education: Exercise Physiology & General Exercise Guidelines: - Group verbal and written instruction with models to review the exercise physiology of the cardiovascular system and associated critical values. Provides general exercise guidelines with specific guidelines to those with heart or lung disease.  Flowsheet Row Cardiac Rehab from 09/04/2020 in [facility] Cardiac and Pulmonary Rehab  Date 08/28/20  Educator AS  Instruction Review Code 1- Verbalizes Understanding       Education: Flexibility, Balance, Mind/Body Relaxation: - Group verbal and visual presentation with interactive activity on the components of exercise prescription. Introduces F.I.T.T principle from ACSM for exercise prescriptions. Reviews F.I.T.T. principles of flexibility and balance exercise training including progression. Also discusses the mind body connection.  Reviews various relaxation techniques to help reduce and manage stress (i.e. Deep breathing, progressive muscle relaxation, and visualization). Balance handout provided to take home. Written material given at graduation. Flowsheet Row Cardiac Rehab from 12/15/2018 in [facility] Cardiac and Pulmonary Rehab  Date 12/15/18  Educator AS  Instruction Review Code 1- Verbalizes Understanding       Activity Barriers & Risk Stratification:  Activity Barriers & Cardiac Risk Stratification - 08/15/20 0953       Activity Barriers & Cardiac Risk Stratification   Activity Barriers Shortness of Breath;Deconditioning;Muscular Weakness;Other (comment)    Comments legs  burn/cramp with walking (PAD)    Cardiac Risk Stratification High             6 Minute Walk:  6 Minute Walk     Row Name 08/15/20 0945         6 Minute Walk   Phase Initial     Distance 1200 feet     Walk Time 6 minutes     # of Rest Breaks 0     MPH 2.27     METS 3.04     RPE 10     Perceived Dyspnea  1     VO2 Peak 10.63     Symptoms Yes (comment)     Comments bilateral legs buring 4/10, SOB     Resting HR 65 bpm     Resting BP 132/72     Resting Oxygen Saturation  98 %     Exercise Oxygen Saturation  during 6 min walk 98 %     Max Ex. HR 78 bpm     Max Ex. BP 136/74     2 Minute Post BP 128/70              Oxygen Initial  Assessment:   Oxygen Re-Evaluation:   Oxygen Discharge (Final Oxygen Re-Evaluation):   Initial Exercise Prescription:  Initial Exercise Prescription - 08/15/20 0900       Date of Initial Exercise RX and Referring Provider   Date 08/15/20    Referring Provider Constance HawSun, Albert MD      Treadmill   MPH 2.2    Grade 1    Minutes 15    METs 2.99      Arm Ergometer   Level 2    Watts 40    RPM 30    Minutes 15    METs 2      REL-XR   Level 2    Speed 50    Minutes 15    METs 2      T5 Nustep   Level 3    SPM 80    Minutes 15    METs 2      Track   Laps 31    Minutes 15    METs 2.69      Prescription Details   Frequency (times per week) 3    Duration Progress to 30 minutes of continuous aerobic without signs/symptoms of physical distress      Intensity   THRR 40-80% of Max Heartrate 100-135    Ratings of Perceived Exertion 11-13    Perceived Dyspnea 0-4      Progression   Progression Continue to progress workloads to maintain intensity without signs/symptoms of physical distress.      Resistance Training   Training Prescription Yes    Weight 4 lb    Reps 10-15             Perform Capillary Blood Glucose checks as needed.  Exercise Prescription Changes:   Exercise Prescription Changes     Row  Name 08/15/20 0900 08/19/20 1600 09/02/20 1400 09/13/20 1000       Response to Exercise   Blood Pressure (Admit) 132/76 100/58 102/60 --    Blood Pressure (Exercise) 136/74 122/80 118/62 --    Blood Pressure (Exit) 128/70 114/68 98/58 --    Heart Rate (Admit) 65 bpm 71 bpm 59 bpm --    Heart Rate (Exercise) 78 bpm 100 bpm 88 bpm --    Heart Rate (Exit) 66 bpm 81 bpm 62 bpm --    Oxygen Saturation (Admit) 98 % -- -- --    Oxygen Saturation (Exercise) 98 % -- -- --    Rating of Perceived Exertion (Exercise) 10 14 14  --    Perceived Dyspnea (Exercise) 1 -- -- --    Symptoms legs buring 4/10, SOB none none --    Comments walk test results first full day of exercise -- --    Duration -- Progress to 30 minutes of  aerobic without signs/symptoms of physical distress Continue with 30 min of aerobic exercise without signs/symptoms of physical distress. --    Intensity -- THRR unchanged THRR unchanged --         Progression   Progression -- Continue to progress workloads to maintain intensity without signs/symptoms of physical distress. Continue to progress workloads to maintain intensity without signs/symptoms of physical distress. --    Average METs -- 2.55 3.3 --         Resistance Training   Training Prescription -- Yes Yes --    Weight -- 4 lb 4 lb --    Reps -- 10-15 10-15 --         Interval  Training   Interval Training -- No No --         Recumbant Bike   Level -- 1.4 -- --    Minutes -- 15 -- --    METs -- 3 -- --         NuStep   Level -- 3 -- --    Minutes -- 15 -- --    METs -- 2.1 -- --         REL-XR   Level -- -- 2 --    Minutes -- -- 15 --    METs -- -- 3.9 --         Track   Laps -- -- 30 --    Minutes -- -- 15 --    METs -- -- 2.63 --         Home Exercise Plan   Plans to continue exercise at -- -- -- Home (comment)  walking, treadmill    Frequency -- -- -- Add 2 additional days to program exercise sessions.    Initial Home Exercises Provided --  -- -- 09/13/20             Exercise Comments:   Exercise Comments     Row Name 08/19/20 1020           Exercise Comments First full day of exercise!  Patient was oriented to gym and equipment including functions, settings, policies, and procedures.  Patient's individual exercise prescription and treatment plan were reviewed.  All starting workloads were established based on the results of the 6 minute walk test done at initial orientation visit.  The plan for exercise progression was also introduced and progression will be customized based on patient's performance and goals.                Exercise Goals and Review:   Exercise Goals     Row Name 08/15/20 0955             Exercise Goals   Increase Physical Activity Yes       Intervention Provide advice, education, support and counseling about physical activity/exercise needs.;Develop an individualized exercise prescription for aerobic and resistive training based on initial evaluation findings, risk stratification, comorbidities and participant's personal goals.       Expected Outcomes Short Term: Attend rehab on a regular basis to increase amount of physical activity.;Long Term: Add in home exercise to make exercise part of routine and to increase amount of physical activity.;Long Term: Exercising regularly at least 3-5 days a week.       Increase Strength and Stamina Yes       Intervention Provide advice, education, support and counseling about physical activity/exercise needs.;Develop an individualized exercise prescription for aerobic and resistive training based on initial evaluation findings, risk stratification, comorbidities and participant's personal goals.       Expected Outcomes Short Term: Increase workloads from initial exercise prescription for resistance, speed, and METs.;Short Term: Perform resistance training exercises routinely during rehab and add in resistance training at home;Long Term: Improve  cardiorespiratory fitness, muscular endurance and strength as measured by increased METs and functional capacity ( )       Able to understand and use rate of perceived exertion (RPE) scale Yes       Intervention Provide education and explanation on how to use RPE scale       Expected Outcomes Short Term: Able to use RPE daily in rehab to express subjective intensity level;Long Term:  Able  to use RPE to guide intensity level when exercising independently       Able to understand and use Dyspnea scale Yes       Intervention Provide education and explanation on how to use Dyspnea scale       Expected Outcomes Short Term: Able to use Dyspnea scale daily in rehab to express subjective sense of shortness of breath during exertion;Long Term: Able to use Dyspnea scale to guide intensity level when exercising independently       Knowledge and understanding of Target Heart Rate Range (THRR) Yes       Intervention Provide education and explanation of THRR including how the numbers were predicted and where they are located for reference       Expected Outcomes Short Term: Able to state/look up THRR;Short Term: Able to use daily as guideline for intensity in rehab;Long Term: Able to use THRR to govern intensity when exercising independently       Able to check pulse independently Yes       Intervention Provide education and demonstration on how to check pulse in carotid and radial arteries.;Review the importance of being able to check your own pulse for safety during independent exercise       Expected Outcomes Short Term: Able to explain why pulse checking is important during independent exercise;Long Term: Able to check pulse independently and accurately       Understanding of Exercise Prescription Yes       Intervention Provide education, explanation, and written materials on patient's individual exercise prescription       Expected Outcomes Short Term: Able to explain program exercise prescription;Long  Term: Able to explain home exercise prescription to exercise independently                Exercise Goals Re-Evaluation :  Exercise Goals Re-Evaluation     Row Name 08/19/20 1020 09/02/20 1501 09/04/20 0932 09/13/20 1023       Exercise Goal Re-Evaluation   Exercise Goals Review Able to understand and use rate of perceived exertion (RPE) scale;Able to understand and use Dyspnea scale;Knowledge and understanding of Target Heart Rate Range (THRR);Understanding of Exercise Prescription Increase Physical Activity;Increase Strength and Stamina Increase Physical Activity;Increase Strength and Stamina Increase Physical Activity;Increase Strength and Stamina;Understanding of Exercise Prescription    Comments Reviewed RPE and dyspnea scales, THR and program prescription with pt today.  Pt voiced understanding and was given a copy of goals to take home. Onalee Hua is doing well so far in rehab.  He attends consistently and works at RPE 13-14.  We will continue to monitor progress. Onalee Hua is not exercising at all at home right now. In-between rehab he walked some, but was sick for the most part. He continues to walk a bit , but does not do much. EP to go over home exercise. Reviewed home exercise with pt today.  Pt plans to walk and use treadmill at home for exercise.  We also talked about using staff videos for home use.  Reviewed THR, pulse, RPE, sign and symptoms, pulse oximetery and when to call 911 or MD.  Also discussed weather considerations and indoor options.  Pt voiced understanding.    Expected Outcomes Short: Use RPE daily to regulate intensity. Long: Follow program prescription in THR. Short: attend consistently Long:  build overall stamina Short: attend consistently, EP to go over home exercise Long:  build overall stamina Short: Start to walk more on off days Long: Continue to improve stamina.  Discharge Exercise Prescription (Final Exercise Prescription Changes):  Exercise  Prescription Changes - 09/13/20 1000       Home Exercise Plan   Plans to continue exercise at Home (comment)   walking, treadmill   Frequency Add 2 additional days to program exercise sessions.    Initial Home Exercises Provided 09/13/20             Nutrition:  Target Goals: Understanding of nutrition guidelines, daily intake of sodium 1500mg , cholesterol 200mg , calories 30% from fat and 7% or less from saturated fats, daily to have 5 or more servings of fruits and vegetables.  Education: All About Nutrition: -Group instruction provided by verbal, written material, interactive activities, discussions, models, and posters to present general guidelines for heart healthy nutrition including fat, fiber, MyPlate, the role of sodium in heart healthy nutrition, utilization of the nutrition label, and utilization of this knowledge for meal planning. Follow up email sent as well. Written material given at graduation. Flowsheet Row Cardiac Rehab from 09/04/2020 in [facility] Cardiac and Pulmonary Rehab  Education need identified 08/15/20       Biometrics:  Pre Biometrics - 08/15/20 0955       Pre Biometrics   Height 6' (1.829 m)    Weight 168 lb 14.4 oz (76.6 kg)    BMI (Calculated) 22.9    Single Leg Stand 18.3 seconds              Nutrition Therapy Plan and Nutrition Goals:  Nutrition Therapy & Goals - 09/02/20 1000       Nutrition Therapy   Diet Heart healthy, low Na    Drug/Food Interactions Statins/Certain Fruits    Protein (specify units) 60g    Fiber 30 grams    Whole Grain Foods 3 servings    Saturated Fats 12 max. grams    Fruits and Vegetables 8 servings/day    Sodium 1.5 grams      Personal Nutrition Goals   Nutrition Goal ST: continue with curent diet, include 1-2 cups water instead of sugar sweetened beverage LT: limit sugar sweetened beverages to <1-2 per day.    Comments Onalee Hua was seen by this RD when last in rehab. Reviewed heart healthy eating education. He  does not eat breakfast everyday, they bake a lot and are using more spices now as they have cut back on . He snacks a lot on fruit and nuts. They get a good variety of fruits and vegetables. They eat chicken mostly, [place], fish (perch mostly) - Onalee Hua doesn't like seafood very much.  Drinks: he doesn't drink water, he drinks regular mountain dew, sweet tea, orange juice. He is enjoying the way he is currently eating anmd would not like to make any big changes at this time.      Intervention Plan   Intervention Prescribe, educate and counsel regarding individualized specific dietary modifications aiming towards targeted core components such as weight, hypertension, lipid management, diabetes, heart failure and other comorbidities.;Nutrition handout(s) given to patient.    Expected Outcomes Short Term Goal: Understand basic principles of dietary content, such as calories, fat, sodium, cholesterol and nutrients.;Short Term Goal: A plan has been developed with personal nutrition goals set during dietitian appointment.;Long Term Goal: Adherence to prescribed nutrition plan.             Nutrition Assessments:  MEDIFICTS Score Key: ?70 Need to make dietary changes  40-70 Heart Healthy Diet ? 40 Therapeutic Level Cholesterol Diet  Flowsheet Row Cardiac Rehab from 08/15/2020 in  [facility] Cardiac and Pulmonary Rehab  Picture Your Plate Total Score on Admission 63      Picture Your Plate Scores: <69 Unhealthy dietary pattern with much room for improvement. 41-50 Dietary pattern unlikely to meet recommendations for good health and room for improvement. 51-60 More healthful dietary pattern, with some room for improvement.  >60 Healthy dietary pattern, although there may be some specific behaviors that could be improved.    Nutrition Goals Re-Evaluation:   Nutrition Goals Discharge (Final Nutrition Goals Re-Evaluation):   Psychosocial: Target Goals: Acknowledge presence or absence of significant  depression and/or stress, maximize coping skills, provide positive support system. Participant is able to verbalize types and ability to use techniques and skills needed for reducing stress and depression.   Education: Stress, Anxiety, and Depression - Group verbal and visual presentation to define topics covered.  Reviews how body is impacted by stress, anxiety, and depression.  Also discusses healthy ways to reduce stress and to treat/manage anxiety and depression.  Written material given at graduation.   Education: Sleep Hygiene -Provides group verbal and written instruction about how sleep can affect your health.  Define sleep hygiene, discuss sleep cycles and impact of sleep habits. Review good sleep hygiene tips.    Initial Review & Psychosocial Screening:  Initial Psych Review & Screening - 08/14/20 1038       Initial Review   Current issues with None Identified      Family Dynamics   Good Support System? Yes   wife, sister, brother     Barriers   Psychosocial barriers to participate in program There are no identifiable barriers or psychosocial needs.;The patient should benefit from training in stress management and relaxation.      Screening Interventions   Interventions Encouraged to exercise;Provide feedback about the scores to participant;To provide support and resources with identified psychosocial needs    Expected Outcomes Short Term goal: Utilizing psychosocial counselor, staff and physician to assist with identification of specific Stressors or current issues interfering with healing process. Setting desired goal for each stressor or current issue identified.;Long Term Goal: Stressors or current issues are controlled or eliminated.;Short Term goal: Identification and review with participant of any Quality of Life or Depression concerns found by scoring the questionnaire.;Long Term goal: The participant improves quality of Life and PHQ9 Scores as seen by post scores and/or  verbalization of changes             Quality of Life Scores:   Quality of Life - 08/15/20 0956       Quality of Life   Select Quality of Life      Quality of Life Scores   Health/Function Pre 16.68 %    Socioeconomic Pre 19.5 %    Psych/Spiritual Pre 23.07 %    Family Pre 29.5 %    GLOBAL Pre 20.54 %            Scores of 19 and below usually indicate a poorer quality of life in these areas.  A difference of  2-3 points is a clinically meaningful difference.  A difference of 2-3 points in the total score of the Quality of Life Index has been associated with significant improvement in overall quality of life, self-image, physical symptoms, and general health in studies assessing change in quality of life.  PHQ-9: Recent Review Flowsheet Data     Depression screen [facility] 2/9 08/15/2020 08/29/2018   Decreased Interest 0 0   Down, Depressed, Hopeless 0 0  PHQ - 2 Score 0 0   Altered sleeping 0 0   Tired, decreased energy 1 3   Change in appetite 0 0   Feeling bad or failure about yourself  0 0   Trouble concentrating 0 0   Moving slowly or fidgety/restless 0 0   Suicidal thoughts 0 0   PHQ-9 Score 1 3   Difficult doing work/chores Somewhat difficult Not difficult at all      Interpretation of Total Score  Total Score Depression Severity:  1-4 = Minimal depression, 5-9 = Mild depression, 10-14 = Moderate depression, 15-19 = Moderately severe depression, 20-27 = Severe depression   Psychosocial Evaluation and Intervention:  Psychosocial Evaluation - 08/14/20 1043       Psychosocial Evaluation & Interventions   Interventions Encouraged to exercise with the program and follow exercise prescription    Comments Onalee Hua is returning to cardiac rehab for systolic heart failure. He states he has noticed a decrease in stamina and is looking forward to working at improving that. He reports no stress or concerns with depression. He is very thankful for his support system (wife,  sister, brother) and sees each day as a blessing. He has been managing his heart failure symptoms pretty well and is interested in learning more about it.    Expected Outcomes Short: attend cardiac rehab for education and exercise. Long: develop and maintain positive self care habits.    Continue Psychosocial Services  Follow up required by staff             Psychosocial Re-Evaluation:  Psychosocial Re-Evaluation     Row Name 09/04/20 [phone]             Psychosocial Re-Evaluation   Current issues with None Identified       Comments Onalee Hua reports having bad days, but doesn't stay down and feels good mentally. He likes to work in his garden and work on his car and he hangs out with his wife. His wife and sisters are a good support system for him. He reports sleeping well.       Expected Outcomes ST: continue with relaxing hobbies LT: Onalee Hua is able to live daily without being as nervous about  the expectation of the ICD firing       Interventions Encouraged to attend Cardiac Rehabilitation for the exercise       Continue Psychosocial Services  Follow up required by staff                Psychosocial Discharge (Final Psychosocial Re-Evaluation):  Psychosocial Re-Evaluation - 09/04/20 0938       Psychosocial Re-Evaluation   Current issues with None Identified    Comments Onalee Hua reports having bad days, but doesn't stay down and feels good mentally. He likes to work in his garden and work on his car and he hangs out with his wife. His wife and sisters are a good support system for him. He reports sleeping well.    Expected Outcomes ST: continue with relaxing hobbies LT: Onalee Hua is able to live daily without being as nervous about  the expectation of the ICD firing    Interventions Encouraged to attend Cardiac Rehabilitation for the exercise    Continue Psychosocial Services  Follow up required by staff             Vocational Rehabilitation: Provide vocational rehab assistance to  qualifying candidates.   Vocational Rehab Evaluation & Intervention:  Vocational Rehab - 08/14/20 1043  Initial Vocational Rehab Evaluation & Intervention   Assessment shows need for Vocational Rehabilitation No             Education: Education Goals: Education classes will be provided on a variety of topics geared toward better understanding of heart health and risk factor modification. Participant will state understanding/return demonstration of topics presented as noted by education test scores.  Learning Barriers/Preferences:  Learning Barriers/Preferences - 08/14/20 1043       Learning Barriers/Preferences   Learning Barriers None    Learning Preferences None             General Cardiac Education Topics:  AED/CPR: - Group verbal and written instruction with the use of models to demonstrate the basic use of the AED with the basic ABC's of resuscitation.   Anatomy and Cardiac Procedures: - Group verbal and visual presentation and models provide information about basic cardiac anatomy and function. Reviews the testing methods done to diagnose heart disease and the outcomes of the test results. Describes the treatment choices: Medical Management, Angioplasty, or Coronary Bypass Surgery for treating various heart conditions including Myocardial Infarction, Angina, Valve Disease, and Cardiac Arrhythmias.  Written material given at graduation.   Medication Safety: - Group verbal and visual instruction to review commonly prescribed medications for heart and lung disease. Reviews the medication, class of the drug, and side effects. Includes the steps to properly store meds and maintain the prescription regimen.  Written material given at graduation.   Intimacy: - Group verbal instruction through game format to discuss how heart and lung disease can affect sexual intimacy. Written material given at graduation.. Flowsheet Row Cardiac Rehab from 09/04/2020 in [facility] Cardiac  and Pulmonary Rehab  Date 09/04/20  Educator [facility]  Instruction Review Code 1- Verbalizes Understanding       Know Your Numbers and Heart Failure: - Group verbal and visual instruction to discuss disease risk factors for cardiac and pulmonary disease and treatment options.  Reviews associated critical values for Overweight/Obesity, Hypertension, Cholesterol, and Diabetes.  Discusses basics of heart failure: signs/symptoms and treatments.  Introduces Heart Failure Zone chart for action plan for heart failure.  Written material given at graduation.   Infection Prevention: - Provides verbal and written material to individual with discussion of infection control including proper hand washing and proper equipment cleaning during exercise session. Flowsheet Row Cardiac Rehab from 09/04/2020 in [facility] Cardiac and Pulmonary Rehab  Date 08/15/20  Educator [facility]  Instruction Review Code 1- Verbalizes Understanding       Falls Prevention: - Provides verbal and written material to individual with discussion of falls prevention and safety. Flowsheet Row Cardiac Rehab from 09/04/2020 in [facility] Cardiac and Pulmonary Rehab  Date 08/15/20  Educator [facility]  Instruction Review Code 1- Verbalizes Understanding       Other: -Provides group and verbal instruction on various topics (see comments)   Knowledge Questionnaire Score:  Knowledge Questionnaire Score - 08/15/20 0957       Knowledge Questionnaire Score   Pre Score 26/28 Education: Nutrition, Tobacco             Core Components/Risk Factors/Patient Goals at Admission:  Personal Goals and Risk Factors at Admission - 08/15/20 0957       Core Components/Risk Factors/Patient Goals on Admission    Weight Management Yes;Weight Maintenance    Intervention Weight Management: Develop a combined nutrition and exercise program designed to reach desired caloric intake, while maintaining appropriate intake of nutrient and fiber, sodium and fats, and  appropriate energy expenditure required for the weight goal.;Weight Management: Provide education and appropriate resources to help participant work on and attain dietary goals.    Admit Weight 168 lb 14.4 oz (76.6 kg)    Goal Weight: Short Term 168 lb (76.2 kg)    Goal Weight: Long Term 168 lb (76.2 kg)    Expected Outcomes Short Term: Continue to assess and modify interventions until short term weight is achieved;Long Term: Adherence to nutrition and physical activity/exercise program aimed toward attainment of established weight goal;Weight Maintenance: Understanding of the daily nutrition guidelines, which includes 25-35% calories from fat, 7% or less cal from saturated fats, less than  cholesterol, less than 1.5gm of sodium, & 5 or more servings of fruits and vegetables daily    Intervention Provide a combined exercise and nutrition program that is supplemented with education, support and counseling about heart failure. Directed toward relieving symptoms such as shortness of breath, decreased exercise tolerance, and extremity edema.    Expected Outcomes Improve functional capacity of life;Short term: Attendance in program 2-3 days a week with increased exercise capacity. Reported lower sodium intake. Reported increased fruit and vegetable intake. Reports medication compliance.;Short term: Daily weights obtained and reported for increase. Utilizing diuretic protocols set by physician.;Long term: Adoption of self-care skills and reduction of barriers for early signs and symptoms recognition and intervention leading to self-care maintenance.    Hypertension Yes    Intervention Provide education on lifestyle modifcations including regular physical activity/exercise, weight management, moderate sodium restriction and increased consumption of fresh fruit, vegetables, and low fat dairy, alcohol moderation, and smoking cessation.;Monitor prescription use compliance.    Expected Outcomes Short Term:  Continued assessment and intervention until BP is < 140/60mm HG in hypertensive participants. < 130/74mm HG in hypertensive participants with diabetes, heart failure or chronic kidney disease.;Long Term: Maintenance of blood pressure at goal levels.    Lipids Yes    Intervention Provide education and support for participant on nutrition & aerobic/resistive exercise along with prescribed medications to achieve LDL 70mg , HDL >40mg .    Expected Outcomes Short Term: Participant states understanding of desired cholesterol values and is compliant with medications prescribed. Participant is following exercise prescription and nutrition guidelines.;Long Term: Cholesterol controlled with medications as prescribed, with individualized exercise RX and with personalized nutrition plan. Value goals: LDL < , HDL > 40 mg.             Education:Diabetes - Individual verbal and written instruction to review signs/symptoms of diabetes, desired ranges of glucose level fasting, after meals and with exercise. Acknowledge that pre and post exercise glucose checks will be done for 3 sessions at entry of program.   Core Components/Risk Factors/Patient Goals Review:   Goals and Risk Factor Review     Row Name 09/04/20 0934             Core Components/Risk Factors/Patient Goals Review   Personal Goals Review Weight Management/Obesity;Diabetes;Hypertension       Review Onalee Hua doesn't normally check his BG at home - he is diet controlled. His BP is doing well, he checks it most days (114-116/60s) - today his BP was 112/64. His weight has been generally stable, but he lost a few pounds since last in rehab. He continues to take medications as directed - no issues.       Expected Outcomes Short: continue to attend rehab  Long: Continue to monitor risk factors.  Core Components/Risk Factors/Patient Goals at Discharge (Final Review):   Goals and Risk Factor Review - 09/04/20 0934       Core  Components/Risk Factors/Patient Goals Review   Personal Goals Review Weight Management/Obesity;Diabetes;Hypertension    Review Onalee Hua doesn't normally check his BG at home - he is diet controlled. His BP is doing well, he checks it most days (114-116/60s) - today his BP was 112/64. His weight has been generally stable, but he lost a few pounds since last in rehab. He continues to take medications as directed - no issues.    Expected Outcomes Short: continue to attend rehab  Long: Continue to monitor risk factors.             ITP Comments:  ITP Comments     Row Name 08/14/20 1052 08/15/20 0944 08/19/20 1019 08/21/20 0658 09/02/20 1145   ITP Comments Initial telephone orientation completed. Diagnosis can be found in [place] records in CE. EP orientation scheduled for Thursday 8/4 at 8am. Completed and gym orientation. Initial ITP created and sent for review to Dr. Bethann Punches, Medical Director. First full day of exercise!  Patient was oriented to gym and equipment including functions, settings, policies, and procedures.  Patient's individual exercise prescription and treatment plan were reviewed.  All starting workloads were established based on the results of the 6 minute walk test done at initial orientation visit.  The plan for exercise progression was also introduced and progression will be customized based on patient's performance and goals. 30 Day review completed. Medical Director ITP review done, changes made as directed, and signed approval by Medical Director.     New to program Completed initial RD consultation    Row Name 09/18/20 0604           ITP Comments 30 Day review completed. Medical Director ITP review done, changes made as directed, and signed approval by Medical Director.                Comments:

## 2020-09-18 NOTE — Progress Notes (Signed)
Daily Session Note  Patient Details  Name: RUFFUS KAMAKA MRN: [id number] Date of Birth: [birth year] Referring Provider:   Flowsheet Row Cardiac Rehab from 08/15/2020 in [facility] Cardiac and Pulmonary Rehab  Referring Provider Teena Dunk MD       Encounter Date: 09/18/2020  Check In:  Session Check In - 09/18/20 1001       Check-In   Supervising physician immediately available to respond to emergencies See telemetry face sheet for immediately available ER MD    Location [facility]-Cardiac & Pulmonary Rehab    Staff Present Heath Lark, RN, BSN, CCRP;Jessica Davenport, MA, RCEP, CCRP, CCET;Joseph [place], [place]    Virtual Visit No    Medication changes reported     No    Fall or balance concerns reported    No    Warm-up and Cool-down Performed on first and last piece of equipment    Resistance Training Performed Yes    VAD Patient? No    PAD/SET Patient? No      Pain Assessment   Currently in Pain? No/denies                Social History   Tobacco Use  Smoking Status Former   Packs/day: 2.00   Years: 43.00   Pack years: 86.00   Types: Cigarettes   Quit date: 2015   Years since quitting: 7.6  Smokeless Tobacco Never    Goals Met:  Independence with exercise equipment Exercise tolerated well No report of concerns or symptoms today  Goals Unmet:  Not Applicable  Comments: Pt able to follow exercise prescription today without complaint.  Will continue to monitor for progression.    Dr. Emily Filbert is Medical Director for [place].  Dr. Ottie Glazier is Medical Director for [facility] Pulmonary Rehabilitation.

## 2020-09-20 ENCOUNTER — Encounter: Payer: No Typology Code available for payment source | Admitting: *Deleted

## 2020-09-20 ENCOUNTER — Other Ambulatory Visit: Payer: Self-pay

## 2020-09-20 DIAGNOSIS — I5022 Chronic systolic (congestive) heart failure: Secondary | ICD-10-CM

## 2020-09-20 NOTE — Progress Notes (Signed)
Daily Session Note  Patient Details  Name: Peter Frey MRN: [id number] Date of Birth: February 28, 1950 Referring Provider:   Flowsheet Row Cardiac Rehab from 08/15/2020 in [facility] Cardiac and Pulmonary Rehab  Referring Provider Teena Dunk MD       Encounter Date: 09/20/2020  Check In:  Session Check In - 09/20/20 0912       Check-In   Supervising physician immediately available to respond to emergencies See telemetry face sheet for immediately available ER MD    Location [facility]-Cardiac & Pulmonary Rehab    Staff Present Renita Papa, RN BSN;Joseph Susan Moore, RCP,RRT,BSRT;Jessica [place], [place], RCEP, CCRP, CCET    Virtual Visit No    Medication changes reported     No    Fall or balance concerns reported    No    Warm-up and Cool-down Performed on first and last piece of equipment    Resistance Training Performed Yes    VAD Patient? No    PAD/SET Patient? No      Pain Assessment   Currently in Pain? No/denies                Social History   Tobacco Use  Smoking Status Former   Packs/day: 2.00   Years: 43.00   Pack years: 86.00   Types: Cigarettes   Quit date: 2015   Years since quitting: 7.6  Smokeless Tobacco Never    Goals Met:  Independence with exercise equipment Exercise tolerated well No report of concerns or symptoms today Strength training completed today  Goals Unmet:  Not Applicable  Comments: Pt able to follow exercise prescription today without complaint.  Will continue to monitor for progression.    Dr. Emily Filbert is Medical Director for [place].  Dr. Ottie Glazier is Medical Director for [facility] Pulmonary Rehabilitation.

## 2020-09-23 ENCOUNTER — Encounter: Payer: No Typology Code available for payment source | Admitting: *Deleted

## 2020-09-23 ENCOUNTER — Other Ambulatory Visit: Payer: Self-pay

## 2020-09-23 DIAGNOSIS — I5022 Chronic systolic (congestive) heart failure: Secondary | ICD-10-CM

## 2020-09-23 NOTE — Progress Notes (Signed)
Daily Session Note  Patient Details  Name: Peter Frey MRN: [id number] Date of Birth: 08/02/1950 Referring Provider:   Flowsheet Row Cardiac Rehab from 08/15/2020 in [facility] Cardiac and Pulmonary Rehab  Referring Provider Sun, Albert MD       Encounter Date: 09/23/2020  Check In:  Session Check In - 09/23/20 0946       Check-In   Supervising physician immediately available to respond to emergencies See telemetry face sheet for immediately available ER MD    Location [facility]-Cardiac & Pulmonary Rehab    Staff Present Susanne Bice, RN, BSN, CCRP;Kelly Hayes, BS, ACSM CEP, Exercise Physiologist;Joseph [place], RCP,RRT,BSRT    Virtual Visit No    Medication changes reported     No    Fall or balance concerns reported    No    Warm-up and Cool-down Performed on first and last piece of equipment    Resistance Training Performed Yes    VAD Patient? No    PAD/SET Patient? No      Pain Assessment   Currently in Pain? No/denies                Social History   Tobacco Use  Smoking Status Former   Packs/day: 2.00   Years: 43.00   Pack years: 86.00   Types: Cigarettes   Quit date: 2015   Years since quitting: 7.7  Smokeless Tobacco Never    Goals Met:  Independence with exercise equipment Exercise tolerated well No report of concerns or symptoms today  Goals Unmet:  Not Applicable  Comments: Pt able to follow exercise prescription today without complaint.  Will continue to monitor for progression.    Dr. Mark Miller is Medical Director for [facility] Cardiac [facility].  Dr. Fuad Aleskerov is Medical Director for [facility] Pulmonary Rehabilitation. 

## 2020-09-25 ENCOUNTER — Other Ambulatory Visit: Payer: Self-pay

## 2020-09-25 DIAGNOSIS — I5022 Chronic systolic (congestive) heart failure: Secondary | ICD-10-CM | POA: Diagnosis not present

## 2020-09-25 NOTE — Progress Notes (Signed)
Daily Session Note  Patient Details  Name: Peter Frey MRN: [id number] Date of Birth: 02-06-1950 Referring Provider:   Flowsheet Row Cardiac Rehab from 08/15/2020 in [facility] Cardiac and Pulmonary Rehab  Referring Provider Teena Dunk MD       Encounter Date: 09/25/2020  Check In:  Session Check In - 09/25/20 8184       Check-In   Supervising physician immediately available to respond to emergencies See telemetry face sheet for immediately available ER MD    Location [facility]-Cardiac & Pulmonary Rehab    Staff Present Birdie Sons, MPA, Elveria Rising, BA, ACSM CEP, Exercise Physiologist;Joseph Tessie [place], [place]    Virtual Visit No    Medication changes reported     No    Fall or balance concerns reported    No    Warm-up and Cool-down Performed on first and last piece of equipment    Resistance Training Performed Yes    VAD Patient? No    PAD/SET Patient? No      Pain Assessment   Currently in Pain? No/denies                Social History   Tobacco Use  Smoking Status Former   Packs/day: 2.00   Years: 43.00   Pack years: 86.00   Types: Cigarettes   Quit date: 2015   Years since quitting: 7.7  Smokeless Tobacco Never    Goals Met:  Independence with exercise equipment Exercise tolerated well No report of concerns or symptoms today Strength training completed today  Goals Unmet:  Not Applicable  Comments: Pt able to follow exercise prescription today without complaint.  Will continue to monitor for progression.    Dr. Emily Filbert is Medical Director for [place].  Dr. Ottie Glazier is Medical Director for [facility] Pulmonary Rehabilitation.

## 2020-09-30 ENCOUNTER — Other Ambulatory Visit: Payer: Self-pay

## 2020-09-30 DIAGNOSIS — I5022 Chronic systolic (congestive) heart failure: Secondary | ICD-10-CM

## 2020-09-30 DIAGNOSIS — I429 Cardiomyopathy, unspecified: Secondary | ICD-10-CM

## 2020-09-30 NOTE — Progress Notes (Signed)
Daily Session Note  Patient Details  Name: Peter Frey MRN: [id number] Date of Birth: 03-16-1950 Referring Provider:   Flowsheet Row Cardiac Rehab from 08/15/2020 in [facility] Cardiac and Pulmonary Rehab  Referring Provider Teena Dunk MD       Encounter Date: 09/30/2020  Check In:  Session Check In - 09/30/20 0933       Check-In   Supervising physician immediately available to respond to emergencies See telemetry face sheet for immediately available ER MD    Location [facility]-Cardiac & Pulmonary Rehab    Staff Present Birdie Sons, MPA, Mauricia Area, BS, ACSM CEP, Exercise Physiologist;Joseph Tessie Fass, [place]    Virtual Visit No    Medication changes reported     No    Fall or balance concerns reported    No    Warm-up and Cool-down Performed on first and last piece of equipment    Resistance Training Performed Yes    VAD Patient? No    PAD/SET Patient? No      Pain Assessment   Currently in Pain? No/denies                Social History   Tobacco Use  Smoking Status Former   Packs/day: 2.00   Years: 43.00   Pack years: 86.00   Types: Cigarettes   Quit date: 2015   Years since quitting: 7.7  Smokeless Tobacco Never    Goals Met:  Independence with exercise equipment Exercise tolerated well No report of concerns or symptoms today Strength training completed today  Goals Unmet:  Not Applicable  Comments: Pt able to follow exercise prescription today without complaint.  Will continue to monitor for progression.    Dr. Emily Filbert is Medical Director for [place].  Dr. Ottie Glazier is Medical Director for [facility] Pulmonary Rehabilitation.

## 2020-10-02 ENCOUNTER — Other Ambulatory Visit: Payer: Self-pay

## 2020-10-02 DIAGNOSIS — I429 Cardiomyopathy, unspecified: Secondary | ICD-10-CM

## 2020-10-02 DIAGNOSIS — I5022 Chronic systolic (congestive) heart failure: Secondary | ICD-10-CM

## 2020-10-02 NOTE — Progress Notes (Signed)
Daily Session Note  Patient Details  Name: Peter Frey MRN: [id number] Date of Birth: [birth year] Referring Provider:   Flowsheet Row Cardiac Rehab from 08/15/2020 in [facility] Cardiac and Pulmonary Rehab  Referring Provider Teena Dunk MD       Encounter Date: 10/02/2020  Check In:  Session Check In - 10/02/20 0930       Check-In   Supervising physician immediately available to respond to emergencies See telemetry face sheet for immediately available ER MD    Location [facility]-Cardiac & Pulmonary Rehab    Staff Present Birdie Sons, MPA, Elveria Rising, BA, ACSM CEP, Exercise Physiologist;Joseph Tessie Fass, [place]    Virtual Visit No    Medication changes reported     No    Fall or balance concerns reported    No    Warm-up and Cool-down Performed on first and last piece of equipment    Resistance Training Performed Yes    VAD Patient? No    PAD/SET Patient? No      Pain Assessment   Currently in Pain? No/denies                Social History   Tobacco Use  Smoking Status Former   Packs/day: 2.00   Years: 43.00   Pack years: 86.00   Types: Cigarettes   Quit date: 2015   Years since quitting: 7.7  Smokeless Tobacco Never    Goals Met:  Independence with exercise equipment Exercise tolerated well No report of concerns or symptoms today Strength training completed today  Goals Unmet:  Not Applicable  Comments: Pt able to follow exercise prescription today without complaint.  Will continue to monitor for progression.    Dr. Emily Filbert is Medical Director for [place].  Dr. Ottie Glazier is Medical Director for [facility] Pulmonary Rehabilitation.

## 2020-10-04 ENCOUNTER — Other Ambulatory Visit: Payer: Self-pay

## 2020-10-04 DIAGNOSIS — I5022 Chronic systolic (congestive) heart failure: Secondary | ICD-10-CM

## 2020-10-04 NOTE — Progress Notes (Signed)
Daily Session Note  Patient Details  Name: Peter Frey MRN: [id number] Date of Birth: [birth year] Referring Provider:   Flowsheet Row Cardiac Rehab from 08/15/2020 in [facility] Cardiac and Pulmonary Rehab  Referring Provider Teena Dunk MD       Encounter Date: 10/04/2020  Check In:  Session Check In - 10/04/20 0924       Check-In   Supervising physician immediately available to respond to emergencies See telemetry face sheet for immediately available ER MD    Location [facility]-Cardiac & Pulmonary Rehab    Staff Present Hope Budds, RDN, LDN;Jessica Luan [place], MA, RCEP, CCRP, CCET;Monserrat Vidaurri, RN, BSN    Virtual Visit No    Medication changes reported     No    Fall or balance concerns reported    No    Warm-up and Cool-down Performed on first and last piece of equipment    Resistance Training Performed Yes    VAD Patient? No    PAD/SET Patient? No      Pain Assessment   Currently in Pain? No/denies                Social History   Tobacco Use  Smoking Status Former   Packs/day: 2.00   Years: 43.00   Pack years: 86.00   Types: Cigarettes   Quit date: 2015   Years since quitting: 7.7  Smokeless Tobacco Never    Goals Met:  Independence with exercise equipment Exercise tolerated well No report of concerns or symptoms today Strength training completed today  Goals Unmet:  Not Applicable  Comments: Pt able to follow exercise prescription today without complaint.  Will continue to monitor for progression.   Dr. Emily Filbert is Medical Director for [place].  Dr. Ottie Glazier is Medical Director for [facility] Pulmonary Rehabilitation.

## 2020-10-07 ENCOUNTER — Other Ambulatory Visit: Payer: Self-pay

## 2020-10-07 ENCOUNTER — Encounter: Payer: No Typology Code available for payment source | Admitting: *Deleted

## 2020-10-07 DIAGNOSIS — I5022 Chronic systolic (congestive) heart failure: Secondary | ICD-10-CM | POA: Diagnosis not present

## 2020-10-07 DIAGNOSIS — I429 Cardiomyopathy, unspecified: Secondary | ICD-10-CM

## 2020-10-07 NOTE — Progress Notes (Signed)
Daily Session Note  Patient Details  Name: Peter Frey MRN: [id number] Date of Birth: [birth year] Referring Provider:   Flowsheet Row Cardiac Rehab from 08/15/2020 in [facility] Cardiac and Pulmonary Rehab  Referring Provider Teena Dunk MD       Encounter Date: 10/07/2020  Check In:      Social History   Tobacco Use  Smoking Status Former   Packs/day: 2.00   Years: 43.00   Pack years: 86.00   Types: Cigarettes   Quit date: 2015   Years since quitting: 7.7  Smokeless Tobacco Never    Goals Met:  Independence with exercise equipment Exercise tolerated well No report of concerns or symptoms today  Goals Unmet:  Not Applicable  Comments: Pt able to follow exercise prescription today without complaint.  Will continue to monitor for progression.    Dr. Emily Filbert is Medical Director for [place].  Dr. Ottie Glazier is Medical Director for [facility] Pulmonary Rehabilitation.

## 2020-10-09 ENCOUNTER — Encounter: Payer: No Typology Code available for payment source | Admitting: *Deleted

## 2020-10-09 ENCOUNTER — Other Ambulatory Visit: Payer: Self-pay

## 2020-10-09 DIAGNOSIS — I5022 Chronic systolic (congestive) heart failure: Secondary | ICD-10-CM | POA: Diagnosis not present

## 2020-10-09 DIAGNOSIS — I429 Cardiomyopathy, unspecified: Secondary | ICD-10-CM

## 2020-10-09 NOTE — Progress Notes (Signed)
Daily Session Note  Patient Details  Name: Peter Frey MRN: [id number] Date of Birth: 1950/12/06 Referring Provider:   Flowsheet Row Cardiac Rehab from 08/15/2020 in [facility]  Referring Provider Teena Dunk MD       Encounter Date: 10/09/2020  Check In:  Session Check In - 10/09/20 1036       Check-In   Supervising physician immediately available to respond to emergencies See telemetry face sheet for immediately available ER MD    Staff Present Nyoka Cowden, RN, BSN, Kela Millin, BA, ACSM CEP, Exercise Physiologist;Joseph Talala, [place]    Virtual Visit No    Medication changes reported     No    Fall or balance concerns reported    No    Tobacco Cessation No Change    Warm-up and Cool-down Performed on first and last piece of equipment    Resistance Training Performed Yes    VAD Patient? No    PAD/SET Patient? No      Pain Assessment   Currently in Pain? No/denies                Social History   Tobacco Use  Smoking Status Former   Packs/day: 2.00   Years: 43.00   Pack years: 86.00   Types: Cigarettes   Quit date: 2015   Years since quitting: 7.7  Smokeless Tobacco Never    Goals Met:  Independence with exercise equipment Exercise tolerated well No report of concerns or symptoms today  Goals Unmet:  Not Applicable  Comments:   Pt able to follow exercise prescription today without complaint.  Will continue to monitor for progression.  Dr. Emily Filbert is Medical Director for [facility].  Dr. Ottie Glazier is Medical Director for [facility].

## 2020-10-14 ENCOUNTER — Encounter: Payer: No Typology Code available for payment source | Attending: Cardiology

## 2020-10-14 ENCOUNTER — Other Ambulatory Visit: Payer: Self-pay

## 2020-10-14 DIAGNOSIS — I5022 Chronic systolic (congestive) heart failure: Secondary | ICD-10-CM | POA: Diagnosis present

## 2020-10-14 DIAGNOSIS — I429 Cardiomyopathy, unspecified: Secondary | ICD-10-CM | POA: Insufficient documentation

## 2020-10-14 NOTE — Progress Notes (Signed)
Daily Session Note  Patient Details  Name: Peter Frey MRN: [id number] Date of Birth: [birth year] Referring Provider:   Flowsheet Row Cardiac Rehab from 08/15/2020 in [facility] Card[facility]  Referring Provider Teena Dunk MD       Encounter Date: 10/14/2020  Check In:  Session Check In - 10/14/20 0916       Check-In   Supervising physician immediately available to respond to emergencies See telemetry face sheet for immediately available ER MD    Location [facility]-Cardiac & Pulmonary Rehab    Staff Present Birdie Sons, MPA, Mauricia Area, BS, ACSM CEP, Exercise Physiologist;Joseph Tessie Fass, [place]    Virtual Visit No    Medication changes reported     No    Fall or balance concerns reported    No    Tobacco Cessation No Change    Warm-up and Cool-down Performed on first and last piece of equipment    Resistance Training Performed Yes    VAD Patient? No    PAD/SET Patient? No      Pain Assessment   Currently in Pain? No/denies                Social History   Tobacco Use  Smoking Status Former   Packs/day: 2.00   Years: 43.00   Pack years: 86.00   Types: Cigarettes   Quit date: 2015   Years since quitting: 7.7  Smokeless Tobacco Never    Goals Met:  Independence with exercise equipment Exercise tolerated well No report of concerns or symptoms today Strength training completed today  Goals Unmet:  Not Applicable  Comments: Pt able to follow exercise prescription today without complaint.  Will continue to monitor for progression.    Dr. Emily Filbert is Medical Director for [place].  Dr. Ottie Glazier is Medical Director for [facility].

## 2020-10-16 ENCOUNTER — Encounter: Payer: Self-pay | Admitting: *Deleted

## 2020-10-16 DIAGNOSIS — I429 Cardiomyopathy, unspecified: Secondary | ICD-10-CM

## 2020-10-16 DIAGNOSIS — I5022 Chronic systolic (congestive) heart failure: Secondary | ICD-10-CM

## 2020-10-16 NOTE — Progress Notes (Signed)
Cardiac Individual Treatment Plan  Patient Details  Name: Peter Frey MRN: [id number] Date of Birth: 23-May-1950 Referring Provider:   Flowsheet Row Cardiac Rehab from 08/15/2020 in [facility] Cardiac and Pulmonary Rehab  Referring Provider Teena Dunk MD       Initial Encounter Date:  Flowsheet Row Cardiac Rehab from 08/15/2020 in [facility] Cardiac and Pulmonary Rehab  Date 08/15/20       Visit Diagnosis: Heart failure, chronic systolic (HCC)  Cardiomyopathy, unspecified type ([place])  Patient's Home Medications on Admission:  Current Outpatient Medications:    acetaminophen (TYLENOL) 500 MG tablet, Take 500 mg by mouth every 6 (six) hours as needed., Disp: , Rfl:    albuterol (VENTOLIN HFA) 108 (90 Base) MCG/ACT inhaler, Inhale into the lungs., Disp: , Rfl:    aspirin 81 MG tablet, Take 81 mg by mouth daily., Disp: , Rfl:    atorvastatin (LIPITOR) 80 MG tablet, TAKE ONE-HALF TABLET BY MOUTH AT BEDTIME FOR HEART AND CHOLESTEROL., Disp: , Rfl:    empagliflozin (JARDIANCE) 25 MG TABS tablet, Take 0.5 tablets by mouth daily., Disp: , Rfl:    ezetimibe (ZETIA) 10 MG tablet, Take 1 tablet by mouth daily., Disp: , Rfl:    furosemide (LASIX) 40 MG tablet, Take 40 mg by mouth daily. , Disp: , Rfl:    gabapentin (NEURONTIN) 300 MG capsule, Take 300 mg by mouth 3 (three) times daily., Disp: , Rfl:    losartan (COZAAR) 25 MG tablet, Take 25 mg by mouth daily. , Disp: , Rfl:    metoprolol (LOPRESSOR) 50 MG tablet, Take 50 mg by mouth 2 (two) times daily. (Patient not taking: Reported on 08/14/2020), Disp: , Rfl:    metoprolol succinate (TOPROL-XL) 100 MG 24 hr tablet, TAKE ONE-HALF TABLET BY MOUTH EVERY DAY FOR HEART AND BLOOD PRESSURE., Disp: , Rfl:    sildenafil (VIAGRA) 50 MG tablet, Take by mouth as needed.  (Patient not taking: Reported on 08/14/2020), Disp: , Rfl:    simvastatin (ZOCOR) 20 MG tablet, Take 20 mg by mouth daily. (Patient not taking: Reported on 08/14/2020), Disp: , Rfl:    spironolactone  (ALDACTONE) 25 MG tablet, Take 25 mg by mouth daily. , Disp: , Rfl:    TIOTROPIUM BROMIDE-OLODATEROL IN, Inhale 2 Inhalers into the lungs daily., Disp: , Rfl:   Past Medical History: Past Medical History:  Diagnosis Date   CHF (congestive heart failure) (HCC)    COPD (chronic obstructive pulmonary disease) (HCC)    Coronary artery disease    MI (myocardial infarction) ([place])     Tobacco Use: Social History   Tobacco Use  Smoking Status Former   Packs/day: 2.00   Years: 43.00   Pack years: 86.00   Types: Cigarettes   Quit date: 2015   Years since quitting: 7.7  Smokeless Tobacco Never    Labs: Recent Review Management consultant for [facility] Cardiac and Pulmonary Rehab Latest Ref Rng & Units 10/18/2018   Cholestrol 0 - 200 mg/dL 94   LDLCALC 0 - 99 mg/dL 29   HDL >40 mg/dL 50   Trlycerides <150 mg/dL 73        Exercise Target Goals: Exercise Program Goal: Individual exercise prescription set using results from initial 6 min walk test and THRR while considering  patient's activity barriers and safety.   Exercise Prescription Goal: Initial exercise prescription builds to 30-45 minutes a day of aerobic activity, 2-3 days per week.  Home exercise guidelines will be given to  patient during program as part of exercise prescription that the participant will acknowledge.   Education: Aerobic Exercise: - Group verbal and visual presentation on the components of exercise prescription. Introduces F.I.T.T principle from ACSM for exercise prescriptions.  Reviews F.I.T.T. principles of aerobic exercise including progression. Written material given at graduation. Flowsheet Row Cardiac Rehab from 09/04/2020 in [facility] Cardiac and Pulmonary Rehab  Date 09/04/20  Educator [facility]  Instruction Review Code 1- Verbalizes Understanding       Education: Resistance Exercise: - Group verbal and visual presentation on the components of exercise prescription. Introduces F.I.T.T principle from ACSM  for exercise prescriptions  Reviews F.I.T.T. principles of resistance exercise including progression. Written material given at graduation.    Education: Exercise & Equipment Safety: - Individual verbal instruction and demonstration of equipment use and safety with use of the equipment. Flowsheet Row Cardiac Rehab from 09/04/2020 in [facility] Cardiac and Pulmonary Rehab  Date 08/15/20  Educator [facility]  Instruction Review Code 1- Verbalizes Understanding       Education: Exercise Physiology & General Exercise Guidelines: - Group verbal and written instruction with models to review the exercise physiology of the cardiovascular system and associated critical values. Provides general exercise guidelines with specific guidelines to those with heart or lung disease.  Flowsheet Row Cardiac Rehab from 09/04/2020 in [facility] Cardiac and Pulmonary Rehab  Date 08/28/20  Educator AS  Instruction Review Code 1- Verbalizes Understanding       Education: Flexibility, Balance, Mind/Body Relaxation: - Group verbal and visual presentation with interactive activity on the components of exercise prescription. Introduces F.I.T.T principle from ACSM for exercise prescriptions. Reviews F.I.T.T. principles of flexibility and balance exercise training including progression. Also discusses the mind body connection.  Reviews various relaxation techniques to help reduce and manage stress (i.e. Deep breathing, progressive muscle relaxation, and visualization). Balance handout provided to take home. Written material given at graduation. Flowsheet Row Cardiac Rehab from 12/15/2018 in [facility] Cardiac and Pulmonary Rehab  Date 12/15/18  Educator AS  Instruction Review Code 1- Verbalizes Understanding       Activity Barriers & Risk Stratification:  Activity Barriers & Cardiac Risk Stratification - 08/15/20 0953       Activity Barriers & Cardiac Risk Stratification   Activity Barriers Shortness of Breath;Deconditioning;Muscular  Weakness;Other (comment)    Comments legs burn/cramp with walking (PAD)    Cardiac Risk Stratification High             6 Minute Walk:  6 Minute Walk     Row Name 08/15/20 0945         6 Minute Walk   Phase Initial     Distance 1200 feet     Walk Time 6 minutes     # of Rest Breaks 0     MPH 2.27     METS 3.04     RPE 10     Perceived Dyspnea  1     VO2 Peak 10.63     Symptoms Yes (comment)     Comments bilateral legs buring 4/10, SOB     Resting HR 65 bpm     Resting BP 132/72     Resting Oxygen Saturation  98 %     Exercise Oxygen Saturation  during 6 min walk 98 %     Max Ex. HR 78 bpm     Max Ex. BP 136/74     2 Minute Post BP 128/70  Oxygen Initial Assessment:   Oxygen Re-Evaluation:   Oxygen Discharge (Final Oxygen Re-Evaluation):   Initial Exercise Prescription:  Initial Exercise Prescription - 08/15/20 0900       Date of Initial Exercise RX and Referring Provider   Date 08/15/20    Referring Provider Constance Haw MD      Treadmill   MPH 2.2    Grade 1    Minutes 15    METs 2.99      Arm Ergometer   Level 2    Watts 40    RPM 30    Minutes 15    METs 2      REL-XR   Level 2    Speed 50    Minutes 15    METs 2      T5 Nustep   Level 3    SPM 80    Minutes 15    METs 2      Track   Laps 31    Minutes 15    METs 2.69      Prescription Details   Frequency (times per week) 3    Duration Progress to 30 minutes of continuous aerobic without signs/symptoms of physical distress      Intensity   THRR 40-80% of Max Heartrate 100-135    Ratings of Perceived Exertion 11-13    Perceived Dyspnea 0-4      Progression   Progression Continue to progress workloads to maintain intensity without signs/symptoms of physical distress.      Resistance Training   Training Prescription Yes    Weight 4 lb    Reps 10-15             Perform Capillary Blood Glucose checks as needed.  Exercise Prescription  Changes:   Exercise Prescription Changes     Row Name 08/15/20 0900 08/19/20 1600 09/02/20 1400 09/13/20 1000 09/18/20 1500     Response to Exercise   Blood Pressure (Admit) 132/76 100/58 102/60 -- 132/70   Blood Pressure (Exercise) 136/74 122/80 118/62 -- 132/60   Blood Pressure (Exit) 128/70 114/68 98/58 -- 104/62   Heart Rate (Admit) 65 bpm 71 bpm 59 bpm -- 90 bpm   Heart Rate (Exercise) 78 bpm 100 bpm 88 bpm -- 103 bpm   Heart Rate (Exit) 66 bpm 81 bpm 62 bpm -- 66 bpm   Oxygen Saturation (Admit) 98 % -- -- -- --   Oxygen Saturation (Exercise) 98 % -- -- -- --   Rating of Perceived Exertion (Exercise) 10 14 14  -- 15   Perceived Dyspnea (Exercise) 1 -- -- -- --   Symptoms legs buring 4/10, SOB none none -- none   Comments walk test results first full day of exercise -- -- --   Duration -- Progress to 30 minutes of  aerobic without signs/symptoms of physical distress Continue with 30 min of aerobic exercise without signs/symptoms of physical distress. -- Continue with 30 min of aerobic exercise without signs/symptoms of physical distress.   Intensity -- THRR unchanged THRR unchanged -- THRR unchanged     Progression   Progression -- Continue to progress workloads to maintain intensity without signs/symptoms of physical distress. Continue to progress workloads to maintain intensity without signs/symptoms of physical distress. -- Continue to progress workloads to maintain intensity without signs/symptoms of physical distress.   Average METs -- 2.55 3.3 -- 2.87     Resistance Training   Training Prescription -- Yes Yes -- Yes   Weight --  4 lb 4 lb -- 5 lb   Reps -- 10-15 10-15 -- 10-15     Interval Training   Interval Training -- No No -- No     Recumbant Bike   Level -- 1.4 -- -- --   Minutes -- 15 -- -- --   METs -- 3 -- -- --     NuStep   Level -- 3 -- -- 2   Minutes -- 15 -- -- 15   METs -- 2.1 -- -- 2.2     Recumbant Elliptical   Level -- -- -- -- 4   Minutes -- --  -- -- 15   METs -- -- -- -- 3.8     REL-XR   Level -- -- 2 -- --   Minutes -- -- 15 -- --   METs -- -- 3.9 -- --     Track   Laps -- -- 30 -- 30   Minutes -- -- 15 -- 15   METs -- -- 2.63 -- 2.63     Home Exercise Plan   Plans to continue exercise at -- -- -- Home (comment)  walking, treadmill Home (comment)  walking, treadmill   Frequency -- -- -- Add 2 additional days to program exercise sessions. Add 2 additional days to program exercise sessions.   Initial Home Exercises Provided -- -- -- 09/13/20 09/13/20    Row Name 09/30/20 1700 10/14/20 1200           Response to Exercise   Blood Pressure (Admit) 104/62 118/64      Blood Pressure (Exit) 102/60 122/60      Heart Rate (Admit) 65 bpm 67 bpm      Heart Rate (Exercise) 81 bpm 98 bpm      Heart Rate (Exit) 66 bpm 75 bpm      Rating of Perceived Exertion (Exercise) 14 13      Symptoms none none      Duration Continue with 30 min of aerobic exercise without signs/symptoms of physical distress. Continue with 30 min of aerobic exercise without signs/symptoms of physical distress.      Intensity THRR unchanged THRR unchanged             Progression   Progression Continue to progress workloads to maintain intensity without signs/symptoms of physical distress. Continue to progress workloads to maintain intensity without signs/symptoms of physical distress.      Average METs 3.9 3.26             Resistance Training   Training Prescription Yes Yes      Weight 5 lb 5 lb      Reps 10-15 10-15             Interval Training   Interval Training No No             REL-XR   Level 5 5      Minutes 15 15      METs 4.5 4.5             Track   Laps 42 43      Minutes 15 15      METs 3.28 3.34             Home Exercise Plan   Plans to continue exercise at Home (comment)  walking, treadmill Home (comment)  walking, treadmill      Frequency Add 2 additional days to program exercise sessions. Add 2 additional days to program  exercise sessions.      Initial Home Exercises Provided 09/13/20 09/13/20               Exercise Comments:   Exercise Comments     Row Name 08/19/20 1020           Exercise Comments First full day of exercise!  Patient was oriented to gym and equipment including functions, settings, policies, and procedures.  Patient's individual exercise prescription and treatment plan were reviewed.  All starting workloads were established based on the results of the 6 minute walk test done at initial orientation visit.  The plan for exercise progression was also introduced and progression will be customized based on patient's performance and goals.                Exercise Goals and Review:   Exercise Goals     Row Name 08/15/20 0955             Exercise Goals   Increase Physical Activity Yes       Intervention Provide advice, education, support and counseling about physical activity/exercise needs.;Develop an individualized exercise prescription for aerobic and resistive training based on initial evaluation findings, risk stratification, comorbidities and participant's personal goals.       Expected Outcomes Short Term: Attend rehab on a regular basis to increase amount of physical activity.;Long Term: Add in home exercise to make exercise part of routine and to increase amount of physical activity.;Long Term: Exercising regularly at least 3-5 days a week.       Increase Strength and Stamina Yes       Intervention Provide advice, education, support and counseling about physical activity/exercise needs.;Develop an individualized exercise prescription for aerobic and resistive training based on initial evaluation findings, risk stratification, comorbidities and participant's personal goals.       Expected Outcomes Short Term: Increase workloads from initial exercise prescription for resistance, speed, and METs.;Short Term: Perform resistance training exercises routinely during rehab and add  in resistance training at home;Long Term: Improve cardiorespiratory fitness, muscular endurance and strength as measured by increased METs and functional capacity (6MWT)       Able to understand and use rate of perceived exertion (RPE) scale Yes       Intervention Provide education and explanation on how to use RPE scale       Expected Outcomes Short Term: Able to use RPE daily in rehab to express subjective intensity level;Long Term:  Able to use RPE to guide intensity level when exercising independently       Able to understand and use Dyspnea scale Yes       Intervention Provide education and explanation on how to use Dyspnea scale       Expected Outcomes Short Term: Able to use Dyspnea scale daily in rehab to express subjective sense of shortness of breath during exertion;Long Term: Able to use Dyspnea scale to guide intensity level when exercising independently       Knowledge and understanding of Target Heart Rate Range (THRR) Yes       Intervention Provide education and explanation of THRR including how the numbers were predicted and where they are located for reference       Expected Outcomes Short Term: Able to state/look up THRR;Short Term: Able to use daily as guideline for intensity in rehab;Long Term: Able to use THRR to govern intensity when exercising independently       Able to check pulse independently Yes  Intervention Provide education and demonstration on how to check pulse in carotid and radial arteries.;Review the importance of being able to check your own pulse for safety during independent exercise       Expected Outcomes Short Term: Able to explain why pulse checking is important during independent exercise;Long Term: Able to check pulse independently and accurately       Understanding of Exercise Prescription Yes       Intervention Provide education, explanation, and written materials on patient's individual exercise prescription       Expected Outcomes Short Term: Able  to explain program exercise prescription;Long Term: Able to explain home exercise prescription to exercise independently                Exercise Goals Re-Evaluation :  Exercise Goals Re-Evaluation     Row Name 08/19/20 1020 09/02/20 1501 09/04/20 0932 09/13/20 1023 09/18/20 1524     Exercise Goal Re-Evaluation   Exercise Goals Review Able to understand and use rate of perceived exertion (RPE) scale;Able to understand and use Dyspnea scale;Knowledge and understanding of Target Heart Rate Range (THRR);Understanding of Exercise Prescription Increase Physical Activity;Increase Strength and Stamina Increase Physical Activity;Increase Strength and Stamina Increase Physical Activity;Increase Strength and Stamina;Understanding of Exercise Prescription Increase Physical Activity;Increase Strength and Stamina;Understanding of Exercise Prescription   Comments Reviewed RPE and dyspnea scales, THR and program prescription with pt today.  Pt voiced understanding and was given a copy of goals to take home. Peter Frey is doing well so far in rehab.  He attends consistently and works at [employer].  We will continue to monitor progress. Peter Frey is not exercising at all at home right now. In-between rehab he walked some, but was sick for the most part. He continues to walk a bit , but does not do much. EP to go over home exercise. Reviewed home exercise with pt today.  Pt plans to walk and use treadmill at home for exercise.  We also talked about using staff videos for home use.  Reviewed THR, pulse, RPE, sign and symptoms, pulse oximetery and when to call 911 or MD.  Also discussed weather considerations and indoor options.  Pt voiced understanding. Peter Frey is doing well in rehab.  He is holdong steady at 30 laps.  We talked today about aiming for a mile on the track again.  He was agreeable.  We will continue to montior his progress.   Expected Outcomes Short: Use RPE daily to regulate intensity. Long: Follow program  prescription in THR. Short: attend consistently Long:  build overall stamina Short: attend consistently, EP to go over home exercise Long:  build overall stamina Short: Start to walk more on off days Long: Continue to improve stamina. Short: Aim for a mile on the track Long; Continue to improve stamina.    [place] Name 09/30/20 9937 09/30/20 1721 10/14/20 1244         Exercise Goal Re-Evaluation   Exercise Goals Review Increase Physical Activity;Increase Strength and Stamina;Understanding of Exercise Prescription Increase Physical Activity;Increase Strength and Stamina Increase Physical Activity;Increase Strength and Stamina     Comments Peter Frey is doing well in rehab.  He has not been doing his exercise at home. They do walk around the neighborhood some twice a week for about 30-45 min.  They are not walking very fast, so we talked about walking fast enough to get into his target heart rate range. He does note that his strenght and stamina are getting better and his heart feels  better overall. -- Peter Frey is doing well in rehab. He has increased to a number of max laps of 43 on the track. RPEs have been in appropriate range. Will continue to monitor.     Expected Outcomes Short: Walk faster on off days in THR Long: Continue to improve stamina -- Short: Continue building up laps on the track Long: Continue to increase overall MET level              Discharge Exercise Prescription (Final Exercise Prescription Changes):  Exercise Prescription Changes - 10/14/20 1200       Response to Exercise   Blood Pressure (Admit) 118/64    Blood Pressure (Exit) 122/60    Heart Rate (Admit) 67 bpm    Heart Rate (Exercise) 98 bpm    Heart Rate (Exit) 75 bpm    Rating of Perceived Exertion (Exercise) 13    Symptoms none    Duration Continue with 30 min of aerobic exercise without signs/symptoms of physical distress.    Intensity THRR unchanged      Progression   Progression Continue to progress workloads to  maintain intensity without signs/symptoms of physical distress.    Average METs 3.26      Resistance Training   Training Prescription Yes    Weight 5 lb    Reps 10-15      Interval Training   Interval Training No      REL-XR   Level 5    Minutes 15    METs 4.5      Track   Laps 43    Minutes 15    METs 3.34      Home Exercise Plan   Plans to continue exercise at Home (comment)   walking, treadmill   Frequency Add 2 additional days to program exercise sessions.    Initial Home Exercises Provided 09/13/20             Nutrition:  Target Goals: Understanding of nutrition guidelines, daily intake of sodium '1500mg'$ , cholesterol '200mg'$ , calories 30% from fat and 7% or less from saturated fats, daily to have 5 or more servings of fruits and vegetables.  Education: All About Nutrition: -Group instruction provided by verbal, written material, interactive activities, discussions, models, and posters to present general guidelines for heart healthy nutrition including fat, fiber, MyPlate, the role of sodium in heart healthy nutrition, utilization of the nutrition label, and utilization of this knowledge for meal planning. Follow up email sent as well. Written material given at graduation. Flowsheet Row Cardiac Rehab from 09/04/2020 in [facility] Cardiac and Pulmonary Rehab  Education need identified 08/15/20       Biometrics:  Pre Biometrics - 08/15/20 0955       Pre Biometrics   Height 6' (1.829 m)    Weight 168 lb 14.4 oz (76.6 kg)    BMI (Calculated) 22.9    Single Leg Stand 18.3 seconds              Nutrition Therapy Plan and Nutrition Goals:  Nutrition Therapy & Goals - 09/02/20 1000       Nutrition Therapy   Diet Heart healthy, low Na    Drug/Food Interactions Statins/Certain Fruits    Protein (specify units) 60g    Fiber 30 grams    Whole Grain Foods 3 servings    Saturated Fats 12 max. grams    Fruits and Vegetables 8 servings/day    Sodium 1.5 grams       Personal Nutrition Goals  Nutrition Goal ST: continue with curent diet, include 1-2 cups water instead of sugar sweetened beverage LT: limit sugar sweetened beverages to <1-2 per day.    Comments Peter Frey was seen by this RD when last in rehab. Reviewed heart healthy eating education. He does not eat breakfast everyday, they bake a lot and are using more spices now as they have cut back on . He snacks a lot on fruit and nuts. They get a good variety of fruits and vegetables. They eat chicken mostly, [place], fish (perch mostly) - Peter Frey doesn't like seafood very much.  Drinks: he doesn't drink water, he drinks regular mountain dew, sweet tea, orange juice. He is enjoying the way he is currently eating anmd would not like to make any big changes at this time.      Intervention Plan   Intervention Prescribe, educate and counsel regarding individualized specific dietary modifications aiming towards targeted core components such as weight, hypertension, lipid management, diabetes, heart failure and other comorbidities.;Nutrition handout(s) given to patient.    Expected Outcomes Short Term Goal: Understand basic principles of dietary content, such as calories, fat, sodium, cholesterol and nutrients.;Short Term Goal: A plan has been developed with personal nutrition goals set during dietitian appointment.;Long Term Goal: Adherence to prescribed nutrition plan.             Nutrition Assessments:  MEDIFICTS Score Key: ?70 Need to make dietary changes  40-70 Heart Healthy Diet ? 40 Therapeutic Level Cholesterol Diet  Flowsheet Row Cardiac Rehab from 08/15/2020 in [facility] Cardiac and Pulmonary Rehab  Picture Your Plate Total Score on Admission 63      Picture Your Plate Scores: <06 Unhealthy dietary pattern with much room for improvement. 41-50 Dietary pattern unlikely to meet recommendations for good health and room for improvement. 51-60 More healthful dietary pattern, with some room for improvement.   >60 Healthy dietary pattern, although there may be some specific behaviors that could be improved.    Nutrition Goals Re-Evaluation:  Nutrition Goals Re-Evaluation     [place] Name 09/30/20 0932             Goals   Nutrition Goal Contineu with currentl diet       Comment Peter Frey is doing well in rehab.  His weight is down some.  He is trying to eat a good balance.  He stays away from fried good and salt.  They have tried a variety of different spices for seasoning. Peter Frey is trying to drink more water and orange juice. He tries to get enough protein in his diet.       Expected Outcome Short: Continue to increase water intake Long: Continue to eat a balance diet.                Nutrition Goals Discharge (Final Nutrition Goals Re-Evaluation):  Nutrition Goals Re-Evaluation - 09/30/20 0932       Goals   Nutrition Goal Contineu with currentl diet    Comment Peter Frey is doing well in rehab.  His weight is down some.  He is trying to eat a good balance.  He stays away from fried good and salt.  They have tried a variety of different spices for seasoning. Peter Frey is trying to drink more water and orange juice. He tries to get enough protein in his diet.    Expected Outcome Short: Continue to increase water intake Long: Continue to eat a balance diet.  Psychosocial: Target Goals: Acknowledge presence or absence of significant depression and/or stress, maximize coping skills, provide positive support system. Participant is able to verbalize types and ability to use techniques and skills needed for reducing stress and depression.   Education: Stress, Anxiety, and Depression - Group verbal and visual presentation to define topics covered.  Reviews how body is impacted by stress, anxiety, and depression.  Also discusses healthy ways to reduce stress and to treat/manage anxiety and depression.  Written material given at graduation.   Education: Sleep Hygiene -Provides group verbal  and written instruction about how sleep can affect your health.  Define sleep hygiene, discuss sleep cycles and impact of sleep habits. Review good sleep hygiene tips.    Initial Review & Psychosocial Screening:  Initial Psych Review & Screening - 08/14/20 1038       Initial Review   Current issues with None Identified      Family Dynamics   Good Support System? Yes   wife, sister, brother     Barriers   Psychosocial barriers to participate in program There are no identifiable barriers or psychosocial needs.;The patient should benefit from training in stress management and relaxation.      Screening Interventions   Interventions Encouraged to exercise;Provide feedback about the scores to participant;To provide support and resources with identified psychosocial needs    Expected Outcomes Short Term goal: Utilizing psychosocial counselor, staff and physician to assist with identification of specific Stressors or current issues interfering with healing process. Setting desired goal for each stressor or current issue identified.;Long Term Goal: Stressors or current issues are controlled or eliminated.;Short Term goal: Identification and review with participant of any Quality of Life or Depression concerns found by scoring the questionnaire.;Long Term goal: The participant improves quality of Life and PHQ9 Scores as seen by post scores and/or verbalization of changes             Quality of Life Scores:   Quality of Life - 08/15/20 0956       Quality of Life   Select Quality of Life      Quality of Life Scores   Health/Function Pre 16.68 %    Socioeconomic Pre 19.5 %    Psych/Spiritual Pre 23.07 %    Family Pre 29.5 %    GLOBAL Pre 20.54 %            Scores of 19 and below usually indicate a poorer quality of life in these areas.  A difference of  2-3 points is a clinically meaningful difference.  A difference of 2-3 points in the total score of the Quality of Life Index has  been associated with significant improvement in overall quality of life, self-image, physical symptoms, and general health in studies assessing change in quality of life.  PHQ-9: Recent Review Flowsheet Data     Depression screen [facility] 2/9 08/15/2020 08/29/2018   Decreased Interest 0 0   Down, Depressed, Hopeless 0 0   PHQ - 2 Score 0 0   Altered sleeping 0 0   Tired, decreased energy 1 3   Change in appetite 0 0   Feeling bad or failure about yourself  0 0   Trouble concentrating 0 0   Moving slowly or fidgety/restless 0 0   Suicidal thoughts 0 0   PHQ-9 Score 1 3   Difficult doing work/chores Somewhat difficult Not difficult at all      Interpretation of Total Score  Total Score Depression Severity:  1-4 = Minimal depression, 5-9 = Mild depression, 10-14 = Moderate depression, 15-19 = Moderately severe depression, 20-27 = Severe depression   Psychosocial Evaluation and Intervention:  Psychosocial Evaluation - 08/14/20 1043       Psychosocial Evaluation & Interventions   Interventions Encouraged to exercise with the program and follow exercise prescription    Comments Peter Frey is returning to cardiac rehab for systolic heart failure. He states he has noticed a decrease in stamina and is looking forward to working at improving that. He reports no stress or concerns with depression. He is very thankful for his support system (wife, sister, brother) and sees each day as a blessing. He has been managing his heart failure symptoms pretty well and is interested in learning more about it.    Expected Outcomes Short: attend cardiac rehab for education and exercise. Long: develop and maintain positive self care habits.    Continue Psychosocial Services  Follow up required by staff             Psychosocial Re-Evaluation:  Psychosocial Re-Evaluation     [place] Name 09/04/20 [phone] 09/30/20 0930           Psychosocial Re-Evaluation   Current issues with None Identified None Identified       Comments Peter Frey reports having bad days, but doesn't stay down and feels good mentally. He likes to work in his garden and work on his car and he hangs out with his wife. His wife and sisters are a good support system for him. He reports sleeping well. Peter Frey is doing well in rehab. He denies any major stressors at this point.  He and his wife are walking on his off days.  He sleeps well most nights.      Expected Outcomes ST: continue with relaxing hobbies LT: Peter Frey is able to live daily without being as nervous about  the expectation of the [employer] firing Short: Continue to exercise for mental boost Long: Continue to focus on positive.      Interventions Encouraged to attend Cardiac Rehabilitation for the exercise Encouraged to attend Cardiac Rehabilitation for the exercise      Continue Psychosocial Services  Follow up required by staff Follow up required by staff               Psychosocial Discharge (Final Psychosocial Re-Evaluation):  Psychosocial Re-Evaluation - 09/30/20 0930       Psychosocial Re-Evaluation   Current issues with None Identified    Comments Peter Frey is doing well in rehab. He denies any major stressors at this point.  He and his wife are walking on his off days.  He sleeps well most nights.    Expected Outcomes Short: Continue to exercise for mental boost Long: Continue to focus on positive.    Interventions Encouraged to attend Cardiac Rehabilitation for the exercise    Continue Psychosocial Services  Follow up required by staff             Vocational Rehabilitation: Provide vocational rehab assistance to qualifying candidates.   Vocational Rehab Evaluation & Intervention:  Vocational Rehab - 08/14/20 1043       Initial Vocational Rehab Evaluation & Intervention   Assessment shows need for Vocational Rehabilitation No             Education: Education Goals: Education classes will be provided on a variety of topics geared toward better understanding of heart  health and risk factor modification. Participant will state understanding/return demonstration of topics presented  as noted by education test scores.  Learning Barriers/Preferences:  Learning Barriers/Preferences - 08/14/20 1043       Learning Barriers/Preferences   Learning Barriers None    Learning Preferences None             General Cardiac Education Topics:  AED/CPR: - Group verbal and written instruction with the use of models to demonstrate the basic use of the AED with the basic ABC's of resuscitation.   Anatomy and Cardiac Procedures: - Group verbal and visual presentation and models provide information about basic cardiac anatomy and function. Reviews the testing methods done to diagnose heart disease and the outcomes of the test results. Describes the treatment choices: Medical Management, Angioplasty, or Coronary Bypass Surgery for treating various heart conditions including Myocardial Infarction, Angina, Valve Disease, and Cardiac Arrhythmias.  Written material given at graduation.   Medication Safety: - Group verbal and visual instruction to review commonly prescribed medications for heart and lung disease. Reviews the medication, class of the drug, and side effects. Includes the steps to properly store meds and maintain the prescription regimen.  Written material given at graduation.   Intimacy: - Group verbal instruction through game format to discuss how heart and lung disease can affect sexual intimacy. Written material given at graduation.. Flowsheet Row Cardiac Rehab from 09/04/2020 in [facility] Cardiac and Pulmonary Rehab  Date 09/04/20  Educator [facility]  Instruction Review Code 1- Verbalizes Understanding       Know Your Numbers and Heart Failure: - Group verbal and visual instruction to discuss disease risk factors for cardiac and pulmonary disease and treatment options.  Reviews associated critical values for Overweight/Obesity, Hypertension, Cholesterol, and  Diabetes.  Discusses basics of heart failure: signs/symptoms and treatments.  Introduces Heart Failure Zone chart for action plan for heart failure.  Written material given at graduation.   Infection Prevention: - Provides verbal and written material to individual with discussion of infection control including proper hand washing and proper equipment cleaning during exercise session. Flowsheet Row Cardiac Rehab from 09/04/2020 in [facility] Cardiac and Pulmonary Rehab  Date 08/15/20  Educator [facility]  Instruction Review Code 1- Verbalizes Understanding       Falls Prevention: - Provides verbal and written material to individual with discussion of falls prevention and safety. Flowsheet Row Cardiac Rehab from 09/04/2020 in [facility] Cardiac and Pulmonary Rehab  Date 08/15/20  Educator [facility]  Instruction Review Code 1- Verbalizes Understanding       Other: -Provides group and verbal instruction on various topics (see comments)   Knowledge Questionnaire Score:  Knowledge Questionnaire Score - 08/15/20 0957       Knowledge Questionnaire Score   Pre Score 26/28 Education: Nutrition, Tobacco             Core Components/Risk Factors/Patient Goals at Admission:  Personal Goals and Risk Factors at Admission - 08/15/20 0957       Core Components/Risk Factors/Patient Goals on Admission    Weight Management Yes;Weight Maintenance    Intervention Weight Management: Develop a combined nutrition and exercise program designed to reach desired caloric intake, while maintaining appropriate intake of nutrient and fiber, sodium and fats, and appropriate energy expenditure required for the weight goal.;Weight Management: Provide education and appropriate resources to help participant work on and attain dietary goals.    Admit Weight 168 lb 14.4 oz (76.6 kg)    Goal Weight: Short Term 168 lb (76.2 kg)    Goal Weight: Long Term 168 lb (76.2 kg)  Expected Outcomes Short Term: Continue to assess and modify  interventions until short term weight is achieved;Long Term: Adherence to nutrition and physical activity/exercise program aimed toward attainment of established weight goal;Weight Maintenance: Understanding of the daily nutrition guidelines, which includes 25-35% calories from fat, 7% or less cal from saturated fats, less than $RemoveB'200mg'LUResqTC$  cholesterol, less than 1.5gm of sodium, & 5 or more servings of fruits and vegetables daily    Intervention Provide a combined exercise and nutrition program that is supplemented with education, support and counseling about heart failure. Directed toward relieving symptoms such as shortness of breath, decreased exercise tolerance, and extremity edema.    Expected Outcomes Improve functional capacity of life;Short term: Attendance in program 2-3 days a week with increased exercise capacity. Reported lower sodium intake. Reported increased fruit and vegetable intake. Reports medication compliance.;Short term: Daily weights obtained and reported for increase. Utilizing diuretic protocols set by physician.;Long term: Adoption of self-care skills and reduction of barriers for early signs and symptoms recognition and intervention leading to self-care maintenance.    Hypertension Yes    Intervention Provide education on lifestyle modifcations including regular physical activity/exercise, weight management, moderate sodium restriction and increased consumption of fresh fruit, vegetables, and low fat dairy, alcohol moderation, and smoking cessation.;Monitor prescription use compliance.    Expected Outcomes Short Term: Continued assessment and intervention until BP is < 140/2mm HG in hypertensive participants. < 130/27mm HG in hypertensive participants with diabetes, heart failure or chronic kidney disease.;Long Term: Maintenance of blood pressure at goal levels.    Lipids Yes    Intervention Provide education and support for participant on nutrition & aerobic/resistive exercise along  with prescribed medications to achieve LDL '70mg'$ , HDL >$Remo'40mg'JrCIt$ .    Expected Outcomes Short Term: Participant states understanding of desired cholesterol values and is compliant with medications prescribed. Participant is following exercise prescription and nutrition guidelines.;Long Term: Cholesterol controlled with medications as prescribed, with individualized exercise RX and with personalized nutrition plan. Value goals: LDL < $Rem'70mg'Ljlm$ , HDL > 40 mg.             Education:Diabetes - Individual verbal and written instruction to review signs/symptoms of diabetes, desired ranges of glucose level fasting, after meals and with exercise. Acknowledge that pre and post exercise glucose checks will be done for 3 sessions at entry of program.   Core Components/Risk Factors/Patient Goals Review:   Goals and Risk Factor Review     Row Name 09/04/20 0934 09/30/20 0935           Core Components/Risk Factors/Patient Goals Review   Personal Goals Review Weight Management/Obesity;Diabetes;Hypertension Weight Management/Obesity;Hypertension      Review Peter Frey doesn't normally check his BG at home - he is diet controlled. His BP is doing well, he checks it most days (114-116/60s) - today his BP was 112/64. His weight has been generally stable, but he lost a few pounds since last in rehab. He continues to take medications as directed - no issues. Peter Frey is doing well in rehab.  His weight is down some today to 166 lb. His pressures are doing well and he checks them at home.  He is continuing to keep his diabetes is check with his diet and exercise. He is doing well with his medications.      Expected Outcomes Short: continue to attend rehab  Long: Continue to monitor risk factors. Short: Continue to work on weight loss Long: Continue to monitor risk factors.  Core Components/Risk Factors/Patient Goals at Discharge (Final Review):   Goals and Risk Factor Review - 09/30/20 0935       Core  Components/Risk Factors/Patient Goals Review   Personal Goals Review Weight Management/Obesity;Hypertension    Review Peter Frey is doing well in rehab.  His weight is down some today to 166 lb. His pressures are doing well and he checks them at home.  He is continuing to keep his diabetes is check with his diet and exercise. He is doing well with his medications.    Expected Outcomes Short: Continue to work on weight loss Long: Continue to monitor risk factors.             ITP Comments:  ITP Comments     Row Name 08/14/20 1052 08/15/20 0944 08/19/20 1019 08/21/20 0658 09/02/20 1145   ITP Comments Initial telephone orientation completed. Diagnosis can be found in [place] records in [place]. EP orientation scheduled for Thursday 8/4 at 8am. Completed 6MWT and gym orientation. Initial ITP created and sent for review to Dr. Emily Filbert, Medical Director. First full day of exercise!  Patient was oriented to gym and equipment including functions, settings, policies, and procedures.  Patient's individual exercise prescription and treatment plan were reviewed.  All starting workloads were established based on the results of the 6 minute walk test done at initial orientation visit.  The plan for exercise progression was also introduced and progression will be customized based on patient's performance and goals. 30 Day review completed. Medical Director ITP review done, changes made as directed, and signed approval by Medical Director.     New to program Completed initial RD consultation    Row Name 09/18/20 0604 10/16/20 0813         ITP Comments 30 Day review completed. Medical Director ITP review done, changes made as directed, and signed approval by Medical Director. 30 day review completed. ITP sent to Dr. Emily Filbert, Medical Director of Cardiac Rehab. Continue with ITP unless changes are made by physician.               Comments: 30 day review

## 2020-10-28 ENCOUNTER — Other Ambulatory Visit: Payer: Self-pay

## 2020-10-28 ENCOUNTER — Encounter: Payer: No Typology Code available for payment source | Admitting: *Deleted

## 2020-10-28 DIAGNOSIS — I429 Cardiomyopathy, unspecified: Secondary | ICD-10-CM | POA: Diagnosis not present

## 2020-10-28 DIAGNOSIS — I5022 Chronic systolic (congestive) heart failure: Secondary | ICD-10-CM

## 2020-10-28 NOTE — Progress Notes (Signed)
Daily Session Note  Patient Details  Name: Peter Frey MRN: [id number] Date of Birth: July 23, 1950 Referring Provider:   Flowsheet Row Cardiac Rehab from 08/15/2020 in [facility] Cardiac and Pulmonary Rehab  Referring Provider Teena Dunk MD       Encounter Date: 10/28/2020  Check In:  Session Check In - 10/28/20 0933       Check-In   Supervising physician immediately available to respond to emergencies See telemetry face sheet for immediately available ER MD    Location [facility]-Cardiac & Pulmonary Rehab    Staff Present Heath Lark, RN, BSN, Laveda Norman, BS, ACSM CEP, Exercise Physiologist;Amanda Oletta Darter, [place], ACSM CEP, Exercise Physiologist    Virtual Visit No    Medication changes reported     No    Fall or balance concerns reported    No    Warm-up and Cool-down Performed on first and last piece of equipment    Resistance Training Performed Yes    VAD Patient? No    PAD/SET Patient? No      Pain Assessment   Currently in Pain? No/denies                Social History   Tobacco Use  Smoking Status Former   Packs/day: 2.00   Years: 43.00   Pack years: 86.00   Types: Cigarettes   Quit date: 2015   Years since quitting: 7.7  Smokeless Tobacco Never    Goals Met:  Independence with exercise equipment Exercise tolerated well No report of concerns or symptoms today  Goals Unmet:  Not Applicable  Comments: Pt able to follow exercise prescription today without complaint.  Will continue to monitor for progression.    Dr. Emily Filbert is Medical Director for [place].  Dr. Ottie Glazier is Medical Director for [facility] Pulmonary Rehabilitation.

## 2020-10-30 ENCOUNTER — Other Ambulatory Visit: Payer: Self-pay

## 2020-10-30 DIAGNOSIS — I5022 Chronic systolic (congestive) heart failure: Secondary | ICD-10-CM

## 2020-10-30 DIAGNOSIS — I429 Cardiomyopathy, unspecified: Secondary | ICD-10-CM | POA: Diagnosis not present

## 2020-10-30 NOTE — Progress Notes (Signed)
Daily Session Note  Patient Details  Name: Peter Frey MRN: [id number] Date of Birth: [birth year] Referring Provider:   Flowsheet Row Cardiac Rehab from 08/15/2020 in [facility] Cardiac and Pulmonary Rehab  Referring Provider Teena Dunk MD       Encounter Date: 10/30/2020  Check In:  Session Check In - 10/30/20 0916       Check-In   Supervising physician immediately available to respond to emergencies See telemetry face sheet for immediately available ER MD    Location [facility]-Cardiac & Pulmonary Rehab    Staff Present Birdie Sons, MPA, Elveria Rising, BA, ACSM CEP, Exercise Physiologist;Melissa [place], RDN, LDN;Joseph [place], RCP,RRT,BSRT    Virtual Visit No    Medication changes reported     No    Fall or balance concerns reported    No    Tobacco Cessation No Change    Warm-up and Cool-down Performed on first and last piece of equipment    Resistance Training Performed Yes    VAD Patient? No    PAD/SET Patient? No      Pain Assessment   Currently in Pain? No/denies                Social History   Tobacco Use  Smoking Status Former   Packs/day: 2.00   Years: 43.00   Pack years: 86.00   Types: Cigarettes   Quit date: 2015   Years since quitting: 7.8  Smokeless Tobacco Never    Goals Met:  Independence with exercise equipment Exercise tolerated well No report of concerns or symptoms today Strength training completed today  Goals Unmet:  Not Applicable  Comments: Pt able to follow exercise prescription today without complaint.  Will continue to monitor for progression.    Dr. Emily Filbert is Medical Director for [place].  Dr. Ottie Glazier is Medical Director for [facility] Pulmonary Rehabilitation.

## 2020-11-01 ENCOUNTER — Other Ambulatory Visit: Payer: Self-pay

## 2020-11-01 ENCOUNTER — Encounter: Payer: No Typology Code available for payment source | Admitting: *Deleted

## 2020-11-01 DIAGNOSIS — I429 Cardiomyopathy, unspecified: Secondary | ICD-10-CM

## 2020-11-01 DIAGNOSIS — I5022 Chronic systolic (congestive) heart failure: Secondary | ICD-10-CM

## 2020-11-01 NOTE — Progress Notes (Signed)
Daily Session Note  Patient Details  Name: Peter Frey MRN: [id number] Date of Birth: 1950-07-18 Referring Provider:   Flowsheet Row Cardiac Rehab from 08/15/2020 in [facility] Cardiac and Pulmonary Rehab  Referring Provider Teena Dunk MD       Encounter Date: 11/01/2020  Check In:  Session Check In - 11/01/20 0945       Check-In   Supervising physician immediately available to respond to emergencies See telemetry face sheet for immediately available ER MD    Location [facility]-Cardiac & Pulmonary Rehab    Staff Present Nyoka Cowden, RN, BSN, Ardeth Sportsman, RDN, LDN;Susanne Bice, RN, BSN, CCRP    Virtual Visit No    Medication changes reported     No    Fall or balance concerns reported    No    Tobacco Cessation No Change    Warm-up and Cool-down Performed on first and last piece of equipment    Resistance Training Performed Yes    VAD Patient? No    PAD/SET Patient? No      Pain Assessment   Currently in Pain? No/denies                Social History   Tobacco Use  Smoking Status Former   Packs/day: 2.00   Years: 43.00   Pack years: 86.00   Types: Cigarettes   Quit date: 2015   Years since quitting: 7.8  Smokeless Tobacco Never    Goals Met:  Independence with exercise equipment Exercise tolerated well No report of concerns or symptoms today  Goals Unmet:  Not Applicable  Comments: .exgoo   Dr. Emily Filbert is Medical Director for [place].  Dr. Ottie Glazier is Medical Director for [facility] Pulmonary Rehabilitation.

## 2020-11-04 ENCOUNTER — Other Ambulatory Visit: Payer: Self-pay

## 2020-11-04 ENCOUNTER — Encounter: Payer: No Typology Code available for payment source | Admitting: *Deleted

## 2020-11-04 DIAGNOSIS — I5022 Chronic systolic (congestive) heart failure: Secondary | ICD-10-CM

## 2020-11-04 NOTE — Progress Notes (Signed)
Daily Session Note  Patient Details  Name: Peter Frey MRN: [id number] Date of Birth: 06-Aug-1950 Referring Provider:   Flowsheet Row Cardiac Rehab from 08/15/2020 in [facility] Cardiac and Pulmonary Rehab  Referring Provider Teena Dunk MD       Encounter Date: 11/04/2020  Check In:  Session Check In - 11/04/20 0937       Check-In   Supervising physician immediately available to respond to emergencies See telemetry face sheet for immediately available ER MD    Location [facility]-Cardiac & Pulmonary Rehab    Staff Present Earlean Shawl, BS, ACSM CEP, Exercise Physiologist;Leta Bucklin, RN, BSN, CCRP    Virtual Visit No    Medication changes reported     No    Fall or balance concerns reported    No    Warm-up and Cool-down Performed on first and last piece of equipment    Resistance Training Performed Yes    VAD Patient? No    PAD/SET Patient? No      Pain Assessment   Currently in Pain? No/denies                Social History   Tobacco Use  Smoking Status Former   Packs/day: 2.00   Years: 43.00   Pack years: 86.00   Types: Cigarettes   Quit date: 2015   Years since quitting: 7.8  Smokeless Tobacco Never    Goals Met:  Independence with exercise equipment Exercise tolerated well No report of concerns or symptoms today  Goals Unmet:  Not Applicable  Comments: Pt able to follow exercise prescription today without complaint.  Will continue to monitor for progression.    Dr. Emily Filbert is Medical Director for [place].  Dr. Ottie Glazier is Medical Director for [facility] Pulmonary Rehabilitation.

## 2020-11-06 ENCOUNTER — Other Ambulatory Visit: Payer: Self-pay

## 2020-11-06 DIAGNOSIS — I5022 Chronic systolic (congestive) heart failure: Secondary | ICD-10-CM

## 2020-11-06 DIAGNOSIS — I429 Cardiomyopathy, unspecified: Secondary | ICD-10-CM | POA: Diagnosis not present

## 2020-11-06 NOTE — Progress Notes (Signed)
Daily Session Note  Patient Details  Name: Peter Frey MRN: [id number] Date of Birth: 1950-10-15 Referring Provider:   Flowsheet Row Cardiac Rehab from 08/15/2020 in [facility] Cardiac and Pulmonary Rehab  Referring Provider Teena Dunk MD       Encounter Date: 11/06/2020  Check In:  Session Check In - 11/06/20 0917       Check-In   Supervising physician immediately available to respond to emergencies See telemetry face sheet for immediately available ER MD    Location [facility]-Cardiac & Pulmonary Rehab    Staff Present Birdie Sons, MPA, RN;Amanda Oletta Darter, BA, ACSM CEP, Exercise Physiologist;Jessica Bisbee, MA, RCEP, CCRP, CCET;Joseph [place], [place]    Virtual Visit No    Medication changes reported     No    Fall or balance concerns reported    No    Tobacco Cessation No Change    Warm-up and Cool-down Performed on first and last piece of equipment    Resistance Training Performed Yes    VAD Patient? No    PAD/SET Patient? No      Pain Assessment   Currently in Pain? No/denies                Social History   Tobacco Use  Smoking Status Former   Packs/day: 2.00   Years: 43.00   Pack years: 86.00   Types: Cigarettes   Quit date: 2015   Years since quitting: 7.8  Smokeless Tobacco Never    Goals Met:  Independence with exercise equipment Exercise tolerated well No report of concerns or symptoms today Strength training completed today  Goals Unmet:  Not Applicable  Comments: Pt able to follow exercise prescription today without complaint.  Will continue to monitor for progression.    Dr. Emily Filbert is Medical Director for [place].  Dr. Ottie Glazier is Medical Director for [facility] Pulmonary Rehabilitation.

## 2020-11-11 ENCOUNTER — Encounter: Payer: No Typology Code available for payment source | Admitting: *Deleted

## 2020-11-11 ENCOUNTER — Other Ambulatory Visit: Payer: Self-pay

## 2020-11-11 DIAGNOSIS — I5022 Chronic systolic (congestive) heart failure: Secondary | ICD-10-CM

## 2020-11-11 DIAGNOSIS — I429 Cardiomyopathy, unspecified: Secondary | ICD-10-CM | POA: Diagnosis not present

## 2020-11-11 NOTE — Progress Notes (Signed)
Daily Session Note  Patient Details  Name: MCKYLE SOLANKI MRN: [id number] Date of Birth: 1950/07/08 Referring Provider:   Flowsheet Row Cardiac Rehab from 08/15/2020 in [facility] Cardiac and Pulmonary Rehab  Referring Provider Teena Dunk MD       Encounter Date: 11/11/2020  Check In:  Session Check In - 11/11/20 0932       Check-In   Supervising physician immediately available to respond to emergencies See telemetry face sheet for immediately available ER MD    Location [facility]-Cardiac & Pulmonary Rehab    Staff Present Heath Lark, RN, BSN, CCRP;Laureen Owens Shark, BS, RRT, CPFT;Kelly Amedeo Plenty, BS, ACSM CEP, Exercise Physiologist    Virtual Visit No    Medication changes reported     No    Fall or balance concerns reported    No    Warm-up and Cool-down Performed on first and last piece of equipment    Resistance Training Performed Yes    VAD Patient? No    PAD/SET Patient? No      Pain Assessment   Currently in Pain? No/denies                Social History   Tobacco Use  Smoking Status Former   Packs/day: 2.00   Years: 43.00   Pack years: 86.00   Types: Cigarettes   Quit date: 2015   Years since quitting: 7.8  Smokeless Tobacco Never    Goals Met:  Independence with exercise equipment Exercise tolerated well No report of concerns or symptoms today  Goals Unmet:  Not Applicable  Comments: Pt able to follow exercise prescription today without complaint.  Will continue to monitor for progression.    Dr. Emily Filbert is Medical Director for [place].  Dr. Ottie Glazier is Medical Director for [facility] Pulmonary Rehabilitation.

## 2020-11-13 ENCOUNTER — Encounter: Payer: No Typology Code available for payment source | Attending: Cardiology

## 2020-11-13 ENCOUNTER — Other Ambulatory Visit: Payer: Self-pay

## 2020-11-13 ENCOUNTER — Encounter: Payer: Self-pay | Admitting: *Deleted

## 2020-11-13 VITALS — Ht 72.0 in | Wt 162.2 lb

## 2020-11-13 DIAGNOSIS — I5022 Chronic systolic (congestive) heart failure: Secondary | ICD-10-CM | POA: Insufficient documentation

## 2020-11-13 DIAGNOSIS — I429 Cardiomyopathy, unspecified: Secondary | ICD-10-CM | POA: Insufficient documentation

## 2020-11-13 NOTE — Progress Notes (Signed)
Cardiac Individual Treatment Plan  Patient Details  Name: Peter Frey MRN: [id number] Date of Birth: 07/10/1950 Referring Provider:   Flowsheet Row Cardiac Rehab from 08/15/2020 in [facility] Cardiac and Pulmonary Rehab  Referring Provider Teena Dunk MD       Initial Encounter Date:  Flowsheet Row Cardiac Rehab from 08/15/2020 in [facility] Cardiac and Pulmonary Rehab  Date 08/15/20       Visit Diagnosis: Heart failure, chronic systolic ([place])  Patient's Home Medications on Admission:  Current Outpatient Medications:    acetaminophen (TYLENOL) 500 MG tablet, Take 500 mg by mouth every 6 (six) hours as needed., Disp: , Rfl:    albuterol (VENTOLIN HFA) 108 (90 Base) MCG/ACT inhaler, Inhale into the lungs., Disp: , Rfl:    aspirin 81 MG tablet, Take 81 mg by mouth daily., Disp: , Rfl:    atorvastatin (LIPITOR) 80 MG tablet, TAKE ONE-HALF TABLET BY MOUTH AT BEDTIME FOR HEART AND CHOLESTEROL., Disp: , Rfl:    empagliflozin (JARDIANCE) 25 MG TABS tablet, Take 0.5 tablets by mouth daily., Disp: , Rfl:    ezetimibe (ZETIA) 10 MG tablet, Take 1 tablet by mouth daily., Disp: , Rfl:    furosemide (LASIX) 40 MG tablet, Take 40 mg by mouth daily. , Disp: , Rfl:    gabapentin (NEURONTIN) 300 MG capsule, Take 300 mg by mouth 3 (three) times daily., Disp: , Rfl:    losartan (COZAAR) 25 MG tablet, Take 25 mg by mouth daily. , Disp: , Rfl:    metoprolol (LOPRESSOR) 50 MG tablet, Take 50 mg by mouth 2 (two) times daily. (Patient not taking: Reported on 08/14/2020), Disp: , Rfl:    metoprolol succinate (TOPROL-XL) 100 MG 24 hr tablet, TAKE ONE-HALF TABLET BY MOUTH EVERY DAY FOR HEART AND BLOOD PRESSURE., Disp: , Rfl:    sildenafil (VIAGRA) 50 MG tablet, Take by mouth as needed.  (Patient not taking: Reported on 08/14/2020), Disp: , Rfl:    simvastatin (ZOCOR) 20 MG tablet, Take 20 mg by mouth daily. (Patient not taking: Reported on 08/14/2020), Disp: , Rfl:    spironolactone (ALDACTONE) 25 MG tablet, Take 25 mg by mouth  daily. , Disp: , Rfl:    TIOTROPIUM BROMIDE-OLODATEROL IN, Inhale 2 Inhalers into the lungs daily., Disp: , Rfl:   Past Medical History: Past Medical History:  Diagnosis Date   CHF (congestive heart failure) (HCC)    COPD (chronic obstructive pulmonary disease) (HCC)    Coronary artery disease    MI (myocardial infarction) ([place])     Tobacco Use: Social History   Tobacco Use  Smoking Status Former   Packs/day: 2.00   Years: 43.00   Pack years: 86.00   Types: Cigarettes   Quit date: 2015   Years since quitting: 7.8  Smokeless Tobacco Never    Labs: Recent Review Management consultant for [facility] Cardiac and Pulmonary Rehab Latest Ref Rng & Units 10/18/2018   Cholestrol 0 - 200 mg/dL 94   LDLCALC 0 - 99 mg/dL 29   HDL >40 mg/dL 50   Trlycerides <150 mg/dL 73        Exercise Target Goals: Exercise Program Goal: Individual exercise prescription set using results from initial 6 min walk test and THRR while considering  patient's activity barriers and safety.   Exercise Prescription Goal: Initial exercise prescription builds to 30-45 minutes a day of aerobic activity, 2-3 days per week.  Home exercise guidelines will be given to patient during program as part  of exercise prescription that the participant will acknowledge.   Education: Aerobic Exercise: - Group verbal and visual presentation on the components of exercise prescription. Introduces F.I.T.T principle from ACSM for exercise prescriptions.  Reviews F.I.T.T. principles of aerobic exercise including progression. Written material given at graduation. Flowsheet Row Cardiac Rehab from 09/04/2020 in [facility] Cardiac and Pulmonary Rehab  Date 09/04/20  Educator [facility]  Instruction Review Code 1- Verbalizes Understanding       Education: Resistance Exercise: - Group verbal and visual presentation on the components of exercise prescription. Introduces F.I.T.T principle from ACSM for exercise prescriptions  Reviews F.I.T.T.  principles of resistance exercise including progression. Written material given at graduation.    Education: Exercise & Equipment Safety: - Individual verbal instruction and demonstration of equipment use and safety with use of the equipment. Flowsheet Row Cardiac Rehab from 09/04/2020 in [facility] Cardiac and Pulmonary Rehab  Date 08/15/20  Educator [facility]  Instruction Review Code 1- Verbalizes Understanding       Education: Exercise Physiology & General Exercise Guidelines: - Group verbal and written instruction with models to review the exercise physiology of the cardiovascular system and associated critical values. Provides general exercise guidelines with specific guidelines to those with heart or lung disease.  Flowsheet Row Cardiac Rehab from 09/04/2020 in [facility] Cardiac and Pulmonary Rehab  Date 08/28/20  Educator AS  Instruction Review Code 1- Verbalizes Understanding       Education: Flexibility, Balance, Mind/Body Relaxation: - Group verbal and visual presentation with interactive activity on the components of exercise prescription. Introduces F.I.T.T principle from ACSM for exercise prescriptions. Reviews F.I.T.T. principles of flexibility and balance exercise training including progression. Also discusses the mind body connection.  Reviews various relaxation techniques to help reduce and manage stress (i.e. Deep breathing, progressive muscle relaxation, and visualization). Balance handout provided to take home. Written material given at graduation. Flowsheet Row Cardiac Rehab from 12/15/2018 in [facility] Cardiac and Pulmonary Rehab  Date 12/15/18  Educator AS  Instruction Review Code 1- Verbalizes Understanding       Activity Barriers & Risk Stratification:  Activity Barriers & Cardiac Risk Stratification - 08/15/20 0953       Activity Barriers & Cardiac Risk Stratification   Activity Barriers Shortness of Breath;Deconditioning;Muscular Weakness;Other (comment)    Comments legs  burn/cramp with walking (PAD)    Cardiac Risk Stratification High             6 Minute Walk:  6 Minute Walk     Row Name 08/15/20 0945         6 Minute Walk   Phase Initial     Distance 1200 feet     Walk Time 6 minutes     # of Rest Breaks 0     MPH 2.27     METS 3.04     RPE 10     Perceived Dyspnea  1     VO2 Peak 10.63     Symptoms Yes (comment)     Comments bilateral legs buring 4/10, SOB     Resting HR 65 bpm     Resting BP 132/72     Resting Oxygen Saturation  98 %     Exercise Oxygen Saturation  during 6 min walk 98 %     Max Ex. HR 78 bpm     Max Ex. BP 136/74     2 Minute Post BP 128/70              Oxygen Initial  Assessment:   Oxygen Re-Evaluation:   Oxygen Discharge (Final Oxygen Re-Evaluation):   Initial Exercise Prescription:  Initial Exercise Prescription - 08/15/20 0900       Date of Initial Exercise RX and Referring Provider   Date 08/15/20    Referring Provider Teena Dunk MD      Treadmill   MPH 2.2    Grade 1    Minutes 15    METs 2.99      Arm Ergometer   Level 2    Watts 40    RPM 30    Minutes 15    METs 2      REL-XR   Level 2    Speed 50    Minutes 15    METs 2      T5 Nustep   Level 3    SPM 80    Minutes 15    METs 2      Track   Laps 31    Minutes 15    METs 2.69      Prescription Details   Frequency (times per week) 3    Duration Progress to 30 minutes of continuous aerobic without signs/symptoms of physical distress      Intensity   THRR 40-80% of Max Heartrate 100-135    Ratings of Perceived Exertion 11-13    Perceived Dyspnea 0-4      Progression   Progression Continue to progress workloads to maintain intensity without signs/symptoms of physical distress.      Resistance Training   Training Prescription Yes    Weight 4 lb    Reps 10-15             Perform Capillary Blood Glucose checks as needed.  Exercise Prescription Changes:   Exercise Prescription Changes     Row  Name 08/15/20 0900 08/19/20 1600 09/02/20 1400 09/13/20 1000 09/18/20 1500     Response to Exercise   Blood Pressure (Admit) 132/76 100/58 102/60 -- 132/70   Blood Pressure (Exercise) 136/74 122/80 118/62 -- 132/60   Blood Pressure (Exit) 128/70 114/68 98/58 -- 104/62   Heart Rate (Admit) 65 bpm 71 bpm 59 bpm -- 90 bpm   Heart Rate (Exercise) 78 bpm 100 bpm 88 bpm -- 103 bpm   Heart Rate (Exit) 66 bpm 81 bpm 62 bpm -- 66 bpm   Oxygen Saturation (Admit) 98 % -- -- -- --   Oxygen Saturation (Exercise) 98 % -- -- -- --   Rating of Perceived Exertion (Exercise) _0 -- 15   Perceived Dyspnea (Exercise) 1 -- -- -- --   Symptoms legs buring 4/10, SOB none none -- none   Comments walk test results first full day of exercise -- -- --   Duration -- Progress to 30 minutes of  aerobic without signs/symptoms of physical distress Continue with 30 min of aerobic exercise without signs/symptoms of physical distress. -- Continue with 30 min of aerobic exercise without signs/symptoms of physical distress.   Intensity -- THRR unchanged THRR unchanged -- THRR unchanged     Progression   Progression -- Continue to progress workloads to maintain intensity without signs/symptoms of physical distress. Continue to progress workloads to maintain intensity without signs/symptoms of physical distress. -- Continue to progress workloads to maintain intensity without signs/symptoms of physical distress.   Average METs -- 2.55 3.3 -- 2.87     Resistance Training   Training Prescription -- Yes Yes -- Yes   Weight -- 4 lb  4 lb -- 5 lb   Reps -- 10-15 10-15 -- 10-15     Interval Training   Interval Training -- No No -- No     Recumbant Bike   Level -- 1.4 -- -- --   Minutes -- 15 -- -- --   METs -- 3 -- -- --     NuStep   Level -- 3 -- -- 2   Minutes -- 15 -- -- 15   METs -- 2.1 -- -- 2.2     Recumbant Elliptical   Level -- -- -- -- 4   Minutes -- -- -- -- 15   METs -- -- -- -- 3.8     REL-XR   Level  -- -- 2 -- --   Minutes -- -- 15 -- --   METs -- -- 3.9 -- --     Track   Laps -- -- 30 -- 30   Minutes -- -- 15 -- 15   METs -- -- 2.63 -- 2.63     Home Exercise Plan   Plans to continue exercise at -- -- -- Home (comment)  walking, treadmill Home (comment)  walking, treadmill   Frequency -- -- -- Add 2 additional days to program exercise sessions. Add 2 additional days to program exercise sessions.   Initial Home Exercises Provided -- -- -- 09/13/20 09/13/20    Row Name 09/30/20 1700 10/14/20 1200 10/29/20 0800 11/11/20 1000       Response to Exercise   Blood Pressure (Admit) 104/62 118/64 104/64 128/62    Blood Pressure (Exit) 102/60 122/60 122/86 122/60    Heart Rate (Admit) 65 bpm 67 bpm 70 bpm 88 bpm    Heart Rate (Exercise) 81 bpm 98 bpm 81 bpm 106 bpm    Heart Rate (Exit) 66 bpm 75 bpm 75 bpm 89 bpm    Oxygen Saturation (Admit) -- -- -- 95 %    Oxygen Saturation (Exercise) -- -- -- 97 %    Oxygen Saturation (Exit) -- -- -- 97 %    Rating of Perceived Exertion (Exercise) _0 Symptoms none none none none    Duration Continue with 30 min of aerobic exercise without signs/symptoms of physical distress. Continue with 30 min of aerobic exercise without signs/symptoms of physical distress. Continue with 30 min of aerobic exercise without signs/symptoms of physical distress. Continue with 30 min of aerobic exercise without signs/symptoms of physical distress.    Intensity THRR unchanged THRR unchanged THRR unchanged THRR unchanged      Progression   Progression Continue to progress workloads to maintain intensity without signs/symptoms of physical distress. Continue to progress workloads to maintain intensity without signs/symptoms of physical distress. Continue to progress workloads to maintain intensity without signs/symptoms of physical distress. Continue to progress workloads to maintain intensity without signs/symptoms of physical distress.    Average METs 3.9 3.26  3.72 3.42      Resistance Training   Training Prescription Yes Yes Yes Yes    Weight 5 lb 5 lb 5 lb 5 lb    Reps 10-15 10-15 10-15 10-15      Interval Training   Interval Training No No No No      Recumbant Elliptical   Level -- -- -- 2    Minutes -- -- -- 15    METs -- -- -- 2.5      REL-XR   Level _1 Minutes  _0 METs 4.5 4.5 4.1 4.7      Track   Laps 42 43 43 38    Minutes _1 METs 3.28 3.34 3.34 3.07      Home Exercise Plan   Plans to continue exercise at Home (comment)  walking, treadmill Home (comment)  walking, treadmill Home (comment)  walking, treadmill Home (comment)  walking, treadmill    Frequency Add 2 additional days to program exercise sessions. Add 2 additional days to program exercise sessions. Add 2 additional days to program exercise sessions. Add 2 additional days to program exercise sessions.    Initial Home Exercises Provided 09/13/20 09/13/20 09/13/20 09/13/20      Oxygen   Maintain Oxygen Saturation -- -- -- 88% or higher             Exercise Comments:   Exercise Comments     Row Name 08/19/20 1020           Exercise Comments First full day of exercise!  Patient was oriented to gym and equipment including functions, settings, policies, and procedures.  Patient's individual exercise prescription and treatment plan were reviewed.  All starting workloads were established based on the results of the 6 minute walk test done at initial orientation visit.  The plan for exercise progression was also introduced and progression will be customized based on patient's performance and goals.                Exercise Goals and Review:   Exercise Goals     Row Name 08/15/20 0955             Exercise Goals   Increase Physical Activity Yes       Intervention Provide advice, education, support and counseling about physical activity/exercise needs.;Develop an individualized exercise prescription for aerobic and  resistive training based on initial evaluation findings, risk stratification, comorbidities and participant's personal goals.       Expected Outcomes Short Term: Attend rehab on a regular basis to increase amount of physical activity.;Long Term: Add in home exercise to make exercise part of routine and to increase amount of physical activity.;Long Term: Exercising regularly at least 3-5 days a week.       Increase Strength and Stamina Yes       Intervention Provide advice, education, support and counseling about physical activity/exercise needs.;Develop an individualized exercise prescription for aerobic and resistive training based on initial evaluation findings, risk stratification, comorbidities and participant's personal goals.       Expected Outcomes Short Term: Increase workloads from initial exercise prescription for resistance, speed, and METs.;Short Term: Perform resistance training exercises routinely during rehab and add in resistance training at home;Long Term: Improve cardiorespiratory fitness, muscular endurance and strength as measured by increased METs and functional capacity (6MWT)       Able to understand and use rate of perceived exertion (RPE) scale Yes       Intervention Provide education and explanation on how to use RPE scale       Expected Outcomes Short Term: Able to use RPE daily in rehab to express subjective intensity level;Long Term:  Able to use RPE to guide intensity level when exercising independently       Able to understand and use Dyspnea scale Yes       Intervention Provide education and explanation on how to use Dyspnea scale       Expected Outcomes Short Term:  Able to use Dyspnea scale daily in rehab to express subjective sense of shortness of breath during exertion;Long Term: Able to use Dyspnea scale to guide intensity level when exercising independently       Knowledge and understanding of Target Heart Rate Range (THRR) Yes       Intervention Provide education and  explanation of THRR including how the numbers were predicted and where they are located for reference       Expected Outcomes Short Term: Able to state/look up THRR;Short Term: Able to use daily as guideline for intensity in rehab;Long Term: Able to use THRR to govern intensity when exercising independently       Able to check pulse independently Yes       Intervention Provide education and demonstration on how to check pulse in carotid and radial arteries.;Review the importance of being able to check your own pulse for safety during independent exercise       Expected Outcomes Short Term: Able to explain why pulse checking is important during independent exercise;Long Term: Able to check pulse independently and accurately       Understanding of Exercise Prescription Yes       Intervention Provide education, explanation, and written materials on patient's individual exercise prescription       Expected Outcomes Short Term: Able to explain program exercise prescription;Long Term: Able to explain home exercise prescription to exercise independently                Exercise Goals Re-Evaluation :  Exercise Goals Re-Evaluation     Row Name 08/19/20 1020 09/02/20 1501 09/04/20 0932 09/13/20 1023 09/18/20 1524     Exercise Goal Re-Evaluation   Exercise Goals Review Able to understand and use rate of perceived exertion (RPE) scale;Able to understand and use Dyspnea scale;Knowledge and understanding of Target Heart Rate Range (THRR);Understanding of Exercise Prescription Increase Physical Activity;Increase Strength and Stamina Increase Physical Activity;Increase Strength and Stamina Increase Physical Activity;Increase Strength and Stamina;Understanding of Exercise Prescription Increase Physical Activity;Increase Strength and Stamina;Understanding of Exercise Prescription   Comments Reviewed RPE and dyspnea scales, THR and program prescription with pt today.  Pt voiced understanding and was given a copy  of goals to take home. Peter Frey is doing well so far in rehab.  He attends consistently and works at [employer].  We will continue to monitor progress. Peter Frey is not exercising at all at home right now. In-between rehab he walked some, but was sick for the most part. He continues to walk a bit , but does not do much. EP to go over home exercise. Reviewed home exercise with pt today.  Pt plans to walk and use treadmill at home for exercise.  We also talked about using staff videos for home use.  Reviewed THR, pulse, RPE, sign and symptoms, pulse oximetery and when to call 911 or MD.  Also discussed weather considerations and indoor options.  Pt voiced understanding. Peter Frey is doing well in rehab.  He is holdong steady at 30 laps.  We talked today about aiming for a mile on the track again.  He was agreeable.  We will continue to montior his progress.   Expected Outcomes Short: Use RPE daily to regulate intensity. Long: Follow program prescription in THR. Short: attend consistently Long:  build overall stamina Short: attend consistently, EP to go over home exercise Long:  build overall stamina Short: Start to walk more on off days Long: Continue to improve stamina. Short: Aim for a mile  on the track Long; Continue to improve stamina.    [place] Name 09/30/20 7001 09/30/20 1721 10/14/20 1244 10/29/20 0818 11/11/20 1046     Exercise Goal Re-Evaluation   Exercise Goals Review Increase Physical Activity;Increase Strength and Stamina;Understanding of Exercise Prescription Increase Physical Activity;Increase Strength and Stamina Increase Physical Activity;Increase Strength and Stamina Increase Physical Activity;Increase Strength and Stamina Increase Physical Activity;Increase Strength and Stamina;Understanding of Exercise Prescription   Comments Peter Frey is doing well in rehab.  He has not been doing his exercise at home. They do walk around the neighborhood some twice a week for about 30-45 min.  They are not walking very fast,  so we talked about walking fast enough to get into his target heart rate range. He does note that his strenght and stamina are getting better and his heart feels better overall. -- Peter Frey is doing well in rehab. He has increased to a number of max laps of 43 on the track. RPEs have been in appropriate range. Will continue to monitor. Today was Davids first day back after being out with Covid.  He did well and made 43 laps on the track! Peter Frey is doing well in rehab.  He is nearing graduation and we expect to see an improvement in his post 6MWT.  He is up to 38 laps on the track and doing level 6 on the XR.  We will continue to monitor his progress.   Expected Outcomes Short: Walk faster on off days in THR Long: Continue to improve stamina -- Short: Continue building up laps on the track Long: Continue to increase overall MET level Short:  get back to consistent attendance Long:  get back to pre Covid stamina Short: Improve post 6MWT Long: Continue to improve stamina            Discharge Exercise Prescription (Final Exercise Prescription Changes):  Exercise Prescription Changes - 11/11/20 1000       Response to Exercise   Blood Pressure (Admit) 128/62    Blood Pressure (Exit) 122/60    Heart Rate (Admit) 88 bpm    Heart Rate (Exercise) 106 bpm    Heart Rate (Exit) 89 bpm    Oxygen Saturation (Admit) 95 %    Oxygen Saturation (Exercise) 97 %    Oxygen Saturation (Exit) 97 %    Rating of Perceived Exertion (Exercise) 14    Symptoms none    Duration Continue with 30 min of aerobic exercise without signs/symptoms of physical distress.    Intensity THRR unchanged      Progression   Progression Continue to progress workloads to maintain intensity without signs/symptoms of physical distress.    Average METs 3.42      Resistance Training   Training Prescription Yes    Weight 5 lb    Reps 10-15      Interval Training   Interval Training No      Recumbant Elliptical   Level 2    Minutes  15    METs 2.5      REL-XR   Level 6    Minutes 15    METs 4.7      Track   Laps 38    Minutes 15    METs 3.07      Home Exercise Plan   Plans to continue exercise at Home (comment)   walking, treadmill   Frequency Add 2 additional days to program exercise sessions.    Initial Home Exercises Provided 09/13/20  Oxygen   Maintain Oxygen Saturation 88% or higher             Nutrition:  Target Goals: Understanding of nutrition guidelines, daily intake of sodium <1575m, cholesterol <2068m calories 30% from fat and 7% or less from saturated fats, daily to have 5 or more servings of fruits and vegetables.  Education: All About Nutrition: -Group instruction provided by verbal, written material, interactive activities, discussions, models, and posters to present general guidelines for heart healthy nutrition including fat, fiber, MyPlate, the role of sodium in heart healthy nutrition, utilization of the nutrition label, and utilization of this knowledge for meal planning. Follow up email sent as well. Written material given at graduation. [facility]  Education need identified 08/15/20       Biometrics:  Pre Biometrics - 08/15/20 0955       Pre Biometrics   Height 6' (1.829 m)    Weight 168 lb 14.4 oz (76.6 kg)    BMI (Calculated) 22.9    Single Leg Stand 18.3 seconds              Nutrition Therapy Plan and Nutrition Goals:  Nutrition Therapy & Goals - 09/02/20 1000       Nutrition Therapy   Diet Heart healthy, low Na    Drug/Food Interactions Statins/Certain Fruits    Protein (specify units) 60g    Fiber 30 grams    Whole Grain Foods 3 servings    Saturated Fats 12 max. grams    Fruits and Vegetables 8 servings/day    Sodium 1.5 grams      Personal Nutrition Goals   Nutrition Goal ST: continue with curent diet, include 1-2 cups water instead of sugar sweetened beverage LT: limit  sugar sweetened beverages to <1-2 per day.    Comments DaShanon Browas seen by this RD when last in rehab. Reviewed heart healthy eating education. He does not eat breakfast everyday, they bake a lot and are using more spices now as they have cut back on . He snacks a lot on fruit and nuts. They get a good variety of fruits and vegetables. They eat chicken mostly, tu[place]uwaitfish (perch mostly) - DaShanon Browoesn't like seafood very much.  Drinks: he doesn't drink water, he drinks regular mountain dew, sweet tea, orange juice. He is enjoying the way he is currently eating anmd would not like to make any big changes at this time.      Intervention Plan   Intervention Prescribe, educate and counsel regarding individualized specific dietary modifications aiming towards targeted core components such as weight, hypertension, lipid management, diabetes, heart failure and other comorbidities.;Nutrition handout(s) given to patient.    Expected Outcomes Short Term Goal: Understand basic principles of dietary content, such as calories, fat, sodium, cholesterol and nutrients.;Short Term Goal: A plan has been developed with personal nutrition goals set during dietitian appointment.;Long Term Goal: Adherence to prescribed nutrition plan.             Nutrition Assessments:  MEDIFICTS Score Key: ?70 Need to make dietary changes  40-70 Heart Healthy Diet ? 40 Therapeutic Level Cholesterol Diet  [facility]  Picture Your Plate Total Score on Admission 63      Picture Your Plate Scores: <4<73nhealthy dietary pattern with much room for improvement. 41-50 Dietary pattern unlikely to meet recommendations for good health and room for improvement. 51-60  More healthful dietary pattern, with some room for improvement.  >60 Healthy dietary pattern, although there may be some specific behaviors that could be improved.    Nutrition Goals Re-Evaluation:   Nutrition Goals Re-Evaluation     [place] Name 09/30/20 0932 10/30/20 0937           Goals   Current Weight -- 161 lb (73 kg)      Nutrition Goal Contineu with currentl diet Contineu with currentl weight and diet.      Comment Peter Frey is doing well in rehab.  His weight is down some.  He is trying to eat a good balance.  He stays away from fried good and salt.  They have tried a variety of different spices for seasoning. Peter Frey is trying to drink more water and orange juice. He tries to get enough protein in his diet. Peter Frey is drinking more water since the last review and is eating more vegetables. He is eating more fruit and nuts and is feeling better.      Expected Outcome Short: Continue to increase water intake Long: Continue to eat a balance diet. Short: continue eating newer and healhy foods. Long: maintain curent diet.               Nutrition Goals Discharge (Final Nutrition Goals Re-Evaluation):  Nutrition Goals Re-Evaluation - 10/30/20 0937       Goals   Current Weight 161 lb (73 kg)    Nutrition Goal Contineu with currentl weight and diet.    Comment Peter Frey is drinking more water since the last review and is eating more vegetables. He is eating more fruit and nuts and is feeling better.    Expected Outcome Short: continue eating newer and healhy foods. Long: maintain curent diet.             Psychosocial: Target Goals: Acknowledge presence or absence of significant depression and/or stress, maximize coping skills, provide positive support system. Participant is able to verbalize types and ability to use techniques and skills needed for reducing stress and depression.   Education: Stress, Anxiety, and Depression - Group verbal and visual presentation to define topics covered.  Reviews how body is impacted by stress, anxiety, and depression.  Also discusses healthy ways to reduce stress and to treat/manage anxiety and depression.  Written material given at  graduation.   Education: Sleep Hygiene -Provides group verbal and written instruction about how sleep can affect your health.  Define sleep hygiene, discuss sleep cycles and impact of sleep habits. Review good sleep hygiene tips.    Initial Review & Psychosocial Screening:  Initial Psych Review & Screening - 08/14/20 1038       Initial Review   Current issues with None Identified      Family Dynamics   Good Support System? Yes   wife, sister, brother     Barriers   Psychosocial barriers to participate in program There are no identifiable barriers or psychosocial needs.;The patient should benefit from training in stress management and relaxation.      Screening Interventions   Interventions Encouraged to exercise;Provide feedback about the scores to participant;To provide support and resources with identified psychosocial needs    Expected Outcomes Short Term goal: Utilizing psychosocial counselor, staff and physician to assist with identification of specific Stressors or current issues interfering with healing process. Setting desired goal for each stressor or current issue identified.;Long Term Goal: Stressors or current issues are controlled or eliminated.;Short Term goal: Identification and review with  participant of any Quality of Life or Depression concerns found by scoring the questionnaire.;Long Term goal: The participant improves quality of Life and PHQ9 Scores as seen by post scores and/or verbalization of changes             Quality of Life Scores:   Quality of Life - 08/15/20 0956       Quality of Life   Select Quality of Life      Quality of Life Scores   Health/Function Pre 16.68 %    Socioeconomic Pre 19.5 %    Psych/Spiritual Pre 23.07 %    Family Pre 29.5 %    GLOBAL Pre 20.54 %            Scores of 19 and below usually indicate a poorer quality of life in these areas.  A difference of  2-3 points is a clinically meaningful difference.  A difference of  2-3 points in the total score of the Quality of Life Index has been associated with significant improvement in overall quality of life, self-image, physical symptoms, and general health in studies assessing change in quality of life.  PHQ-9: Recent Review Flowsheet Data     Depression screen [facility] 2/9 08/15/2020 08/29/2018   Decreased Interest 0 0   Down, Depressed, Hopeless 0 0   PHQ - 2 Score 0 0   Altered sleeping 0 0   Tired, decreased energy 1 3   Change in appetite 0 0   Feeling bad or failure about yourself  0 0   Trouble concentrating 0 0   Moving slowly or fidgety/restless 0 0   Suicidal thoughts 0 0   PHQ-9 Score 1 3   Difficult doing work/chores Somewhat difficult Not difficult at all      Interpretation of Total Score  Total Score Depression Severity:  1-4 = Minimal depression, 5-9 = Mild depression, 10-14 = Moderate depression, 15-19 = Moderately severe depression, 20-27 = Severe depression   Psychosocial Evaluation and Intervention:  Psychosocial Evaluation - 08/14/20 1043       Psychosocial Evaluation & Interventions   Interventions Encouraged to exercise with the program and follow exercise prescription    Comments Peter Frey is returning to cardiac rehab for systolic heart failure. He states he has noticed a decrease in stamina and is looking forward to working at improving that. He reports no stress or concerns with depression. He is very thankful for his support system (wife, sister, brother) and sees each day as a blessing. He has been managing his heart failure symptoms pretty well and is interested in learning more about it.    Expected Outcomes Short: attend cardiac rehab for education and exercise. Long: develop and maintain positive self care habits.    Continue Psychosocial Services  Follow up required by staff             Psychosocial Re-Evaluation:  Psychosocial Re-Evaluation     [place] Name 09/04/20 [id number] 09/30/20 0930 10/30/20 0936         Psychosocial  Re-Evaluation   Current issues with None Identified None Identified None Identified     Comments Peter Frey reports having bad days, but doesn't stay down and feels good mentally. He likes to work in his garden and work on his car and he hangs out with his wife. His wife and sisters are a good support system for him. He reports sleeping well. Peter Frey is doing well in rehab. He denies any major stressors at this point.  He  and his wife are walking on his off days.  He sleeps well most nights. Patient reports no issues with their current mental states, sleep, stress, depression or anxiety. Will follow up with patient in a few weeks for any changes.     Expected Outcomes ST: continue with relaxing hobbies LT: Peter Frey is able to live daily without being as nervous about  the expectation of the [employer] firing Short: Continue to exercise for mental boost Long: Continue to focus on positive. Short: Continue to exercise regularly to support mental health and notify staff of any changes. Long: maintain mental health and well being through teaching of rehab or prescribed medications independently.     Interventions Encouraged to attend Cardiac Rehabilitation for the exercise Encouraged to attend Cardiac Rehabilitation for the exercise Encouraged to attend Cardiac Rehabilitation for the exercise     Continue Psychosocial Services  Follow up required by staff Follow up required by staff Follow up required by staff              Psychosocial Discharge (Final Psychosocial Re-Evaluation):  Psychosocial Re-Evaluation - 10/30/20 0936       Psychosocial Re-Evaluation   Current issues with None Identified    Comments Patient reports no issues with their current mental states, sleep, stress, depression or anxiety. Will follow up with patient in a few weeks for any changes.    Expected Outcomes Short: Continue to exercise regularly to support mental health and notify staff of any changes. Long: maintain mental health and well  being through teaching of rehab or prescribed medications independently.    Interventions Encouraged to attend Cardiac Rehabilitation for the exercise    Continue Psychosocial Services  Follow up required by staff             Vocational Rehabilitation: Provide vocational rehab assistance to qualifying candidates.   Vocational Rehab Evaluation & Intervention:  Vocational Rehab - 08/14/20 1043       Initial Vocational Rehab Evaluation & Intervention   Assessment shows need for Vocational Rehabilitation No             Education: Education Goals: Education classes will be provided on a variety of topics geared toward better understanding of heart health and risk factor modification. Participant will state understanding/return demonstration of topics presented as noted by education test scores.  Learning Barriers/Preferences:  Learning Barriers/Preferences - 08/14/20 1043       Learning Barriers/Preferences   Learning Barriers None    Learning Preferences None             General Cardiac Education Topics:  AED/CPR: - Group verbal and written instruction with the use of models to demonstrate the basic use of the AED with the basic ABC's of resuscitation.   Anatomy and Cardiac Procedures: - Group verbal and visual presentation and models provide information about basic cardiac anatomy and function. Reviews the testing methods done to diagnose heart disease and the outcomes of the test results. Describes the treatment choices: Medical Management, Angioplasty, or Coronary Bypass Surgery for treating various heart conditions including Myocardial Infarction, Angina, Valve Disease, and Cardiac Arrhythmias.  Written material given at graduation.   Medication Safety: - Group verbal and visual instruction to review commonly prescribed medications for heart and lung disease. Reviews the medication, class of the drug, and side effects. Includes the steps to properly store meds  and maintain the prescription regimen.  Written material given at graduation.   Intimacy: - Group verbal instruction through game format to  discuss how heart and lung disease can affect sexual intimacy. Written material given at graduation.. Flowsheet Row Cardiac Rehab from 09/04/2020 in [facility] Card[facility]monary Rehab  Date 09/04/20  Educator [facility]  Instruction Review Code 1- Verbalizes Understanding       Know Your Numbers and Heart Failure: - Group verbal and visual instruction to discuss disease risk factors for cardiac and pulmonary disease and treatment options.  Reviews associated critical values for Overweight/Obesity, Hypertension, Cholesterol, and Diabetes.  Discusses basics of heart failure: signs/symptoms and treatments.  Introduces Heart Failure Zone chart for action plan for heart failure.  Written material given at graduation.   Infection Prevention: - Provides verbal and written material to individual with discussion of infection control including proper hand washing and proper equipment cleaning during exercise session. Flowsheet Row Cardiac Rehab from 09/04/2020 in [facility] Card[facility] Rehab  Date 08/15/20  Educator [facility]  Instruction Review Code 1- Verbalizes Understanding       Falls Prevention: - Provides verbal and written material to individual with discussion of falls prevention and safety. Flowsheet Row Cardiac Rehab from 09/04/2020 in [facility] Cardiac and Pulmonary Rehab  Date 08/15/20  Educator [facility]  Instruction Review Code 1- Verbalizes Understanding       Other: -Provides group and verbal instruction on various topics (see comments)   Knowledge Questionnaire Score:  Knowledge Questionnaire Score - 08/15/20 0957       Knowledge Questionnaire Score   Pre Score 26/28 Education: Nutrition, Tobacco             Core Components/Risk Factors/Patient Goals at Admission:  Personal Goals and Risk Factors at Admission - 08/15/20 0957       Core  Components/Risk Factors/Patient Goals on Admission    Weight Management Yes;Weight Maintenance    Intervention Weight Management: Develop a combined nutrition and exercise program designed to reach desired caloric intake, while maintaining appropriate intake of nutrient and fiber, sodium and fats, and appropriate energy expenditure required for the weight goal.;Weight Management: Provide education and appropriate resources to help participant work on and attain dietary goals.    Admit Weight 168 lb 14.4 oz (76.6 kg)    Goal Weight: Short Term 168 lb (76.2 kg)    Goal Weight: Long Term 168 lb (76.2 kg)    Expected Outcomes Short Term: Continue to assess and modify interventions until short term weight is achieved;Long Term: Adherence to nutrition and physical activity/exercise program aimed toward attainment of established weight goal;Weight Maintenance: Understanding of the daily nutrition guidelines, which includes 25-35% calories from fat, 7% or less cal from saturated fats, less than 218m cholesterol, less than 1.5gm of sodium, & 5 or more servings of fruits and vegetables daily    Intervention Provide a combined exercise and nutrition program that is supplemented with education, support and counseling about heart failure. Directed toward relieving symptoms such as shortness of breath, decreased exercise tolerance, and extremity edema.    Expected Outcomes Improve functional capacity of life;Short term: Attendance in program 2-3 days a week with increased exercise capacity. Reported lower sodium intake. Reported increased fruit and vegetable intake. Reports medication compliance.;Short term: Daily weights obtained and reported for increase. Utilizing diuretic protocols set by physician.;Long term: Adoption of self-care skills and reduction of barriers for early signs and symptoms recognition and intervention leading to self-care maintenance.    Hypertension Yes    Intervention Provide education on  lifestyle modifcations including regular physical activity/exercise, weight management, moderate sodium restriction and increased consumption of  fresh fruit, vegetables, and low fat dairy, alcohol moderation, and smoking cessation.;Monitor prescription use compliance.    Expected Outcomes Short Term: Continued assessment and intervention until BP is < 140/68m HG in hypertensive participants. < 130/818mHG in hypertensive participants with diabetes, heart failure or chronic kidney disease.;Long Term: Maintenance of blood pressure at goal levels.    Lipids Yes    Intervention Provide education and support for participant on nutrition & aerobic/resistive exercise along with prescribed medications to achieve LDL <7064mHDL >15m13m  Expected Outcomes Short Term: Participant states understanding of desired cholesterol values and is compliant with medications prescribed. Participant is following exercise prescription and nutrition guidelines.;Long Term: Cholesterol controlled with medications as prescribed, with individualized exercise RX and with personalized nutrition plan. Value goals: LDL < 70mg71mL > 40 mg.             Education:Diabetes - Individual verbal and written instruction to review signs/symptoms of diabetes, desired ranges of glucose level fasting, after meals and with exercise. Acknowledge that pre and post exercise glucose checks will be done for 3 sessions at entry of program.   Core Components/Risk Factors/Patient Goals Review:   Goals and Risk Factor Review     Row Name 09/04/20 0934 09/30/20 0935 10/30/20 0933         Core Components/Risk Factors/Patient Goals Review   Personal Goals Review Weight Management/Obesity;Diabetes;Hypertension Weight Management/Obesity;Hypertension Weight Management/Obesity;Hypertension     Review DavidShanon Brown't normally check his BG at home - he is diet controlled. His BP is doing well, he checks it most days (114-116/60s) - today his BP was  112/64. His weight has been generally stable, but he lost a few pounds since last in rehab. He continues to take medications as directed - no issues. DavidShanon Browoing well in rehab.  His weight is down some today to 166 lb. His pressures are doing well and he checks them at home.  He is continuing to keep his diabetes is check with his diet and exercise. He is doing well with his medications. DavidShanon Browappy with his current weight. He is 161 pounds and is doing well checking his blood pressures at home. His blood pressure has been stable at rest and while exercising. DavidShanon Browes that his blood sugars are good and his A1c.     Expected Outcomes Short: continue to attend rehab  Long: Continue to monitor risk factors. Short: Continue to work on weight loss Long: Continue to monitor risk factors. Short: maintain weight and bllod sugar. Long: keep stable levels of blood sugar and weight independently.              Core Components/Risk Factors/Patient Goals at Discharge (Final Review):   Goals and Risk Factor Review - 10/30/20 0933       Core Components/Risk Factors/Patient Goals Review   Personal Goals Review Weight Management/Obesity;Hypertension    Review DavidShanon Browappy with his current weight. He is 161 pounds and is doing well checking his blood pressures at home. His blood pressure has been stable at rest and while exercising. DavidShanon Browes that his blood sugars are good and his A1c.    Expected Outcomes Short: maintain weight and bllod sugar. Long: keep stable levels of blood sugar and weight independently.             ITP Comments:  ITP Comments     Row Name 08/14/20 1052 08/15/20 0944 08/19/20 1019 08/21/20 0658 09/02/20 1145   ITP Comments Initial telephone  orientation completed. Diagnosis can be found in [place] records in [place]. EP orientation scheduled for Thursday 8/4 at 8am. Completed 6MWT and gym orientation. Initial ITP created and sent for review to Dr. Emily Filbert, Medical Director.  First full day of exercise!  Patient was oriented to gym and equipment including functions, settings, policies, and procedures.  Patient's individual exercise prescription and treatment plan were reviewed.  All starting workloads were established based on the results of the 6 minute walk test done at initial orientation visit.  The plan for exercise progression was also introduced and progression will be customized based on patient's performance and goals. 30 Day review completed. Medical Director ITP review done, changes made as directed, and signed approval by Medical Director.     New to program Completed initial RD consultation    [place] Name 09/18/20 0604 10/16/20 0813 11/13/20 0639       ITP Comments 30 Day review completed. Medical Director ITP review done, changes made as directed, and signed approval by Medical Director. 30 day review completed. ITP sent to Dr. Emily Filbert, Medical Director of Cardiac Rehab. Continue with ITP unless changes are made by physician. 30 Day review completed. Medical Director ITP review done, changes made as directed, and signed approval by Medical Director.              Comments:  30 Day review completed. Medical Director ITP review done, changes made as directed, and signed approval by Medical Director.

## 2020-11-13 NOTE — Progress Notes (Signed)
Daily Session Note  Patient Details  Name: Peter Frey MRN: [id number] Date of Birth: Jun 08, 1950 Referring Provider:   Flowsheet Row Cardiac Rehab from 08/15/2020 in [facility] Cardiac and Pulmonary Rehab  Referring Provider Teena Dunk MD       Encounter Date: 11/13/2020  Check In:  Session Check In - 11/13/20 0924       Check-In   Supervising physician immediately available to respond to emergencies See telemetry face sheet for immediately available ER MD    Location [facility]-Cardiac & Pulmonary Rehab    Staff Present Birdie Sons, MPA, Elveria Rising, BA, ACSM CEP, Exercise Physiologist;Joseph Tessie Fass, [place]    Virtual Visit No    Medication changes reported     No    Fall or balance concerns reported    No    Tobacco Cessation No Change    Warm-up and Cool-down Performed on first and last piece of equipment    Resistance Training Performed Yes    VAD Patient? No    PAD/SET Patient? No      Pain Assessment   Currently in Pain? No/denies                Social History   Tobacco Use  Smoking Status Former   Packs/day: 2.00   Years: 43.00   Pack years: 86.00   Types: Cigarettes   Quit date: 2015   Years since quitting: 7.8  Smokeless Tobacco Never    Goals Met:  Independence with exercise equipment Exercise tolerated well No report of concerns or symptoms today Strength training completed today  Goals Unmet:  Not Applicable  Comments: Pt able to follow exercise prescription today without complaint.  Will continue to monitor for progression.   [place] Name 08/15/20 0945 11/13/20 0935       6 Minute Walk   Phase Initial Discharge    Distance 1200 feet 1505 feet    Distance % Change -- 25.4 %    Distance Feet Change -- 305 ft    Walk Time 6 minutes 6 minutes    # of Rest Breaks 0 0    MPH 2.27 2.85    METS 3.04 3.98    RPE 10 14    Perceived Dyspnea  1 0    VO2 Peak 10.63 13.92    Symptoms Yes (comment) Yes (comment)     Comments bilateral legs buring 4/10, SOB bilateral legs buring 7/10    Resting HR 65 bpm 74 bpm    Resting BP 132/72 136/74    Resting Oxygen Saturation  98 % 97 %    Exercise Oxygen Saturation  during 6 min walk 98 % 98 %    Max Ex. HR 78 bpm 97 bpm    Max Ex. BP 136/74 158/74    2 Minute Post BP 128/70 --              Dr. Emily Filbert is Medical Director for [place].  Dr. Ottie Glazier is Medical Director for [facility] Pulmonary Rehabilitation.

## 2020-11-15 ENCOUNTER — Encounter: Payer: No Typology Code available for payment source | Admitting: *Deleted

## 2020-11-15 ENCOUNTER — Other Ambulatory Visit: Payer: Self-pay

## 2020-11-15 DIAGNOSIS — I5022 Chronic systolic (congestive) heart failure: Secondary | ICD-10-CM | POA: Diagnosis not present

## 2020-11-15 NOTE — Progress Notes (Signed)
Daily Session Note  Patient Details  Name: Peter Frey MRN: [id number] Date of Birth: 08-31-1950 Referring Provider:   Flowsheet Row Cardiac Rehab from 08/15/2020 in [facility] Cardiac and Pulmonary Rehab  Referring Provider Teena Dunk MD       Encounter Date: 11/15/2020  Check In:  Session Check In - 11/15/20 0956       Check-In   Supervising physician immediately available to respond to emergencies See telemetry face sheet for immediately available ER MD    Location [facility]-Cardiac & Pulmonary Rehab    Staff Present Heath Lark, RN, BSN, CCRP;Jessica Dickey, MA, RCEP, CCRP, CCET;Joseph [place], [place]    Virtual Visit No    Medication changes reported     No    Fall or balance concerns reported    No    Warm-up and Cool-down Performed on first and last piece of equipment    Resistance Training Performed Yes    VAD Patient? No    PAD/SET Patient? No      Pain Assessment   Currently in Pain? No/denies                Social History   Tobacco Use  Smoking Status Former   Packs/day: 2.00   Years: 43.00   Pack years: 86.00   Types: Cigarettes   Quit date: 2015   Years since quitting: 7.8  Smokeless Tobacco Never    Goals Met:  Independence with exercise equipment Exercise tolerated well No report of concerns or symptoms today  Goals Unmet:  Not Applicable  Comments: Pt able to follow exercise prescription today without complaint.  Will continue to monitor for progression.    Dr. Emily Filbert is Medical Director for [place].  Dr. Ottie Glazier is Medical Director for [facility] Pulmonary Rehabilitation.

## 2020-11-18 ENCOUNTER — Ambulatory Visit: Payer: No Typology Code available for payment source

## 2020-11-21 NOTE — Patient Instructions (Signed)
Discharge Patient Instructions  Patient Details  Name: Peter Frey MRN: [id number] Date of Birth: March 16, 1950 Referring Provider:  Caryn Section, MD   Number of Visits: 36  Reason for Discharge:  Patient reached a stable level of exercise. Patient independent in their exercise. Patient has met program and personal goals.  Smoking History:  Social History   Tobacco Use  Smoking Status Former   Packs/day: 2.00   Years: 43.00   Pack years: 86.00   Types: Cigarettes   Quit date: 2015   Years since quitting: 7.8  Smokeless Tobacco Never    Diagnosis:  Heart failure, chronic systolic (HCC)  Initial Exercise Prescription:  Initial Exercise Prescription - 08/15/20 0900       Date of Initial Exercise RX and Referring Provider   Date 08/15/20    Referring Provider Peter Haw MD      Treadmill   MPH 2.2    Grade 1    Minutes 15    METs 2.99      Arm Ergometer   Level 2    Watts 40    RPM 30    Minutes 15    METs 2      REL-XR   Level 2    Speed 50    Minutes 15    METs 2      T5 Nustep   Level 3    SPM 80    Minutes 15    METs 2      Track   Laps 31    Minutes 15    METs 2.69      Prescription Details   Frequency (times per week) 3    Duration Progress to 30 minutes of continuous aerobic without signs/symptoms of physical distress      Intensity   THRR 40-80% of Max Heartrate 100-135    Ratings of Perceived Exertion 11-13    Perceived Dyspnea 0-4      Progression   Progression Continue to progress workloads to maintain intensity without signs/symptoms of physical distress.      Resistance Training   Training Prescription Yes    Weight 4 lb    Reps 10-15             Discharge Exercise Prescription (Final Exercise Prescription Changes):  Exercise Prescription Changes - 11/11/20 1000       Response to Exercise   Blood Pressure (Admit) 128/62    Blood Pressure (Exit) 122/60    Heart Rate (Admit) 88 bpm    Heart Rate  (Exercise) 106 bpm    Heart Rate (Exit) 89 bpm    Oxygen Saturation (Admit) 95 %    Oxygen Saturation (Exercise) 97 %    Oxygen Saturation (Exit) 97 %    Rating of Perceived Exertion (Exercise) 14    Symptoms none    Duration Continue with 30 min of aerobic exercise without signs/symptoms of physical distress.    Intensity THRR unchanged      Progression   Progression Continue to progress workloads to maintain intensity without signs/symptoms of physical distress.    Average METs 3.42      Resistance Training   Training Prescription Yes    Weight 5 lb    Reps 10-15      Interval Training   Interval Training No      Recumbant Elliptical   Level 2    Minutes 15    METs 2.5      REL-XR  Level 6    Minutes 15    METs 4.7      Track   Laps 38    Minutes 15    METs 3.07      Home Exercise Plan   Plans to continue exercise at Home (comment)   walking, treadmill   Frequency Add 2 additional days to program exercise sessions.    Initial Home Exercises Provided 09/13/20      Oxygen   Maintain Oxygen Saturation 88% or higher             Functional Capacity:  6 Minute Walk     Row Name 08/15/20 0945 11/13/20 0935       6 Minute Walk   Phase Initial Discharge    Distance 1200 feet 1505 feet    Distance % Change -- 25.4 %    Distance Feet Change -- 305 ft    Walk Time 6 minutes 6 minutes    # of Rest Breaks 0 0    MPH 2.27 2.85    METS 3.04 3.98    RPE 10 14    Perceived Dyspnea  1 0    VO2 Peak 10.63 13.92    Symptoms Yes (comment) Yes (comment)    Comments bilateral legs buring 4/10, SOB bilateral legs buring 7/10    Resting HR 65 bpm 74 bpm    Resting BP 132/72 136/74    Resting Oxygen Saturation  98 % 97 %    Exercise Oxygen Saturation  during 6 min walk 98 % 98 %    Max Ex. HR 78 bpm 97 bpm    Max Ex. BP 136/74 158/74    2 Minute Post BP 128/70 --             Nutrition & Weight - Outcomes:  Pre Biometrics - 08/15/20 0955       Pre  Biometrics   Height 6' (1.829 m)    Weight 168 lb 14.4 oz (76.6 kg)    BMI (Calculated) 22.9    Single Leg Stand 18.3 seconds             Post Biometrics - 11/13/20 0935        Post  Biometrics   Height 6' (1.829 m)    Weight 162 lb 3.2 oz (73.6 kg)    BMI (Calculated) 21.99    Single Leg Stand 23.6 seconds             Nutrition:  Nutrition Therapy & Goals - 09/02/20 1000       Nutrition Therapy   Diet Heart healthy, low Na    Drug/Food Interactions Statins/Certain Fruits    Protein (specify units) 60g    Fiber 30 grams    Whole Grain Foods 3 servings    Saturated Fats 12 max. grams    Fruits and Vegetables 8 servings/day    Sodium 1.5 grams      Personal Nutrition Goals   Nutrition Goal ST: continue with curent diet, include 1-2 cups water instead of sugar sweetened beverage LT: limit sugar sweetened beverages to <1-2 per day.    Comments Peter Frey was seen by this RD when last in rehab. Reviewed heart healthy eating education. He does not eat breakfast everyday, they bake a lot and are using more spices now as they have cut back on . He snacks a lot on fruit and nuts. They get a good variety of fruits and vegetables. They eat chicken mostly, [place], fish (  perch mostly) - Peter Frey doesn't like seafood very much.  Drinks: he doesn't drink water, he drinks regular mountain dew, sweet tea, orange juice. He is enjoying the way he is currently eating anmd would not like to make any big changes at this time.      Intervention Plan   Intervention Prescribe, educate and counsel regarding individualized specific dietary modifications aiming towards targeted core components such as weight, hypertension, lipid management, diabetes, heart failure and other comorbidities.;Nutrition handout(s) given to patient.    Expected Outcomes Short Term Goal: Understand basic principles of dietary content, such as calories, fat, sodium, cholesterol and nutrients.;Short Term Goal: A plan has been  developed with personal nutrition goals set during dietitian appointment.;Long Term Goal: Adherence to prescribed nutrition plan.              Goals reviewed with patient; copy given to patient.

## 2020-11-27 ENCOUNTER — Other Ambulatory Visit: Payer: Self-pay

## 2020-11-27 DIAGNOSIS — I5022 Chronic systolic (congestive) heart failure: Secondary | ICD-10-CM | POA: Diagnosis not present

## 2020-11-27 DIAGNOSIS — I429 Cardiomyopathy, unspecified: Secondary | ICD-10-CM

## 2020-11-27 NOTE — Progress Notes (Signed)
Daily Session Note  Patient Details  Name: Peter Frey MRN: [id number] Date of Birth: July 31, 1950 Referring Provider:   Flowsheet Row Cardiac Rehab from 08/15/2020 in [facility] Cardiac and Pulmonary Rehab  Referring Provider Teena Dunk MD       Encounter Date: 11/27/2020  Check In:  Session Check In - 11/27/20 0914       Check-In   Supervising physician immediately available to respond to emergencies See telemetry face sheet for immediately available ER MD    Location [facility]-Cardiac & Pulmonary Rehab    Staff Present Birdie Sons, MPA, RN;Joseph Tessie Fass, RCP,RRT,BSRT;Amanda Downing, BA, ACSM CEP, Exercise Physiologist    Virtual Visit No    Medication changes reported     No    Fall or balance concerns reported    No    Tobacco Cessation No Change    Warm-up and Cool-down Performed on first and last piece of equipment    Resistance Training Performed Yes    VAD Patient? No    PAD/SET Patient? No      Pain Assessment   Currently in Pain? No/denies                Social History   Tobacco Use  Smoking Status Former   Packs/day: 2.00   Years: 43.00   Pack years: 86.00   Types: Cigarettes   Quit date: 2015   Years since quitting: 7.8  Smokeless Tobacco Never    Goals Met:  Independence with exercise equipment Exercise tolerated well No report of concerns or symptoms today Strength training completed today  Goals Unmet:  Not Applicable  Comments: Pt able to follow exercise prescription today without complaint.  Will continue to monitor for progression.    Dr. Emily Filbert is Medical Director for [place].  Dr. Ottie Glazier is Medical Director for [facility] Pulmonary Rehabilitation.

## 2020-11-29 ENCOUNTER — Encounter: Payer: No Typology Code available for payment source | Admitting: *Deleted

## 2020-11-29 ENCOUNTER — Other Ambulatory Visit: Payer: Self-pay

## 2020-11-29 DIAGNOSIS — I5022 Chronic systolic (congestive) heart failure: Secondary | ICD-10-CM

## 2020-11-29 NOTE — Progress Notes (Signed)
Daily Session Note  Patient Details  Name: Peter Frey MRN: [id number] Date of Birth: 1950-07-21 Referring Provider:   Flowsheet Row Cardiac Rehab from 08/15/2020 in [facility] Cardiac and Pulmonary Rehab  Referring Provider Teena Dunk MD       Encounter Date: 11/29/2020  Check In:  Session Check In - 11/29/20 2256       Check-In   Supervising physician immediately available to respond to emergencies See telemetry face sheet for immediately available ER MD    Location [facility]-Cardiac & Pulmonary Rehab    Staff Present Renita Papa, RN BSN;Joseph Hermitage, RCP,RRT,BSRT;Jessica Bradbury, [place], RCEP, CCRP, CCET    Virtual Visit No    Medication changes reported     No    Fall or balance concerns reported    No    Warm-up and Cool-down Performed on first and last piece of equipment    Resistance Training Performed Yes    VAD Patient? No    PAD/SET Patient? No      Pain Assessment   Currently in Pain? No/denies                Social History   Tobacco Use  Smoking Status Former   Packs/day: 2.00   Years: 43.00   Pack years: 86.00   Types: Cigarettes   Quit date: 2015   Years since quitting: 7.8  Smokeless Tobacco Never    Goals Met:  Independence with exercise equipment Exercise tolerated well No report of concerns or symptoms today Strength training completed today  Goals Unmet:  Not Applicable  Comments: Pt able to follow exercise prescription today without complaint.  Will continue to monitor for progression.    Dr. Emily Filbert is Medical Director for [facility].  Dr. Ottie Glazier is Medical Director for [facility] Pulmonary Rehabilitation.

## 2020-12-02 ENCOUNTER — Other Ambulatory Visit: Payer: Self-pay

## 2020-12-02 ENCOUNTER — Encounter: Payer: No Typology Code available for payment source | Admitting: *Deleted

## 2020-12-02 DIAGNOSIS — I5022 Chronic systolic (congestive) heart failure: Secondary | ICD-10-CM

## 2020-12-02 NOTE — Progress Notes (Signed)
Discharge Progress Report  Patient Details  Name: Peter Frey MRN: [id number] Date of Birth: [birth year] Referring Provider:   Flowsheet Row Cardiac Rehab from 08/15/2020 in [facility] Cardiac and Pulmonary Rehab  Referring Provider Peter Dunk MD        Number of Visits: 36  Reason for Discharge:  Patient reached a stable level of exercise. Patient independent in their exercise.  Smoking History:  Social History   Tobacco Use  Smoking Status Former   Packs/day: 2.00   Years: 43.00   Pack years: 86.00   Types: Cigarettes   Quit date: 2015   Years since quitting: 7.8  Smokeless Tobacco Never    Diagnosis:  Heart failure, chronic systolic ([facility])  ADL UCSD:   Initial Exercise Prescription:  Initial Exercise Prescription - 08/15/20 0900       Date of Initial Exercise RX and Referring Provider   Date 08/15/20    Referring Provider Peter Dunk MD      Treadmill   MPH 2.2    Grade 1    Minutes 15    METs 2.99      Arm Ergometer   Level 2    Watts 40    RPM 30    Minutes 15    METs 2      REL-XR   Level 2    Speed 50    Minutes 15    METs 2      T5 Nustep   Level 3    SPM 80    Minutes 15    METs 2      Track   Laps 31    Minutes 15    METs 2.69      Prescription Details   Frequency (times per week) 3    Duration Progress to 30 minutes of continuous aerobic without signs/symptoms of physical distress      Intensity   THRR 40-80% of Max Heartrate 100-135    Ratings of Perceived Exertion 11-13    Perceived Dyspnea 0-4      Progression   Progression Continue to progress workloads to maintain intensity without signs/symptoms of physical distress.      Resistance Training   Training Prescription Yes    Weight 4 lb    Reps 10-15             Discharge Exercise Prescription (Final Exercise Prescription Changes):  Exercise Prescription Changes - 11/27/20 1600       Response to Exercise   Blood Pressure (Admit) 124/62    Blood Pressure  (Exit) 98/58    Heart Rate (Admit) 91 bpm    Heart Rate (Exercise) 107 bpm    Heart Rate (Exit) 92 bpm    Oxygen Saturation (Admit) 98 %    Oxygen Saturation (Exercise) 96 %    Oxygen Saturation (Exit) 98 %    Rating of Perceived Exertion (Exercise) 14    Symptoms none    Duration Continue with 30 min of aerobic exercise without signs/symptoms of physical distress.    Intensity THRR unchanged      Progression   Progression Continue to progress workloads to maintain intensity without signs/symptoms of physical distress.    Average METs 2.6      Resistance Training   Training Prescription Yes    Weight 5 lb    Reps 10-15      Interval Training   Interval Training No      Recumbant Elliptical   Level 3  Minutes 15    METs 1.8      Track   Laps 43    Minutes 15    METs 3.34      Home Exercise Plan   Plans to continue exercise at Home (comment)   walking, treadmill   Frequency Add 2 additional days to program exercise sessions.    Initial Home Exercises Provided 09/13/20      Oxygen   Maintain Oxygen Saturation 88% or higher             Functional Capacity:  6 Minute Walk     Row Name 08/15/20 0945 11/13/20 0935       6 Minute Walk   Phase Initial Discharge    Distance 1200 feet 1505 feet    Distance % Change -- 25.4 %    Distance Feet Change -- 305 ft    Walk Time 6 minutes 6 minutes    # of Rest Breaks 0 0    MPH 2.27 2.85    METS 3.04 3.98    RPE 10 14    Perceived Dyspnea  1 0    VO2 Peak 10.63 13.92    Symptoms Yes (comment) Yes (comment)    Comments bilateral legs buring 4/10, SOB bilateral legs buring 7/10    Resting HR 65 bpm 74 bpm    Resting BP 132/72 136/74    Resting Oxygen Saturation  98 % 97 %    Exercise Oxygen Saturation  during 6 min walk 98 % 98 %    Max Ex. HR 78 bpm 97 bpm    Max Ex. BP 136/74 158/74    2 Minute Post BP 128/70 --              Nutrition & Weight - Outcomes:  Pre Biometrics - 08/15/20 0955       Pre  Biometrics   Height 6' (1.829 m)    Weight 168 lb 14.4 oz (76.6 kg)    BMI (Calculated) 22.9    Single Leg Stand 18.3 seconds             Post Biometrics - 11/13/20 0935        Post  Biometrics   Height 6' (1.829 m)    Weight 162 lb 3.2 oz (73.6 kg)    BMI (Calculated) 21.99    Single Leg Stand 23.6 seconds             Nutrition:  Nutrition Therapy & Goals - 09/02/20 1000       Nutrition Therapy   Diet Heart healthy, low Na    Drug/Food Interactions Statins/Certain Fruits    Protein (specify units) 60g    Fiber 30 grams    Whole Grain Foods 3 servings    Saturated Fats 12 max. grams    Fruits and Vegetables 8 servings/day    Sodium 1.5 grams      Personal Nutrition Goals   Nutrition Goal ST: continue with curent diet, include 1-2 cups water instead of sugar sweetened beverage LT: limit sugar sweetened beverages to <1-2 per day.    Comments Peter Frey was seen by this RD when last in rehab. Reviewed heart healthy eating education. He does not eat breakfast everyday, they bake a lot and are using more spices now as they have cut back on . He snacks a lot on fruit and nuts. They get a good variety of fruits and vegetables. They eat chicken mostly, [place], fish (perch mostly) - Peter Frey  doesn't like seafood very much.  Drinks: he doesn't drink water, he drinks regular mountain dew, sweet tea, orange juice. He is enjoying the way he is currently eating anmd would not like to make any big changes at this time.      Intervention Plan   Intervention Prescribe, educate and counsel regarding individualized specific dietary modifications aiming towards targeted core components such as weight, hypertension, lipid management, diabetes, heart failure and other comorbidities.;Nutrition handout(s) given to patient.    Expected Outcomes Short Term Goal: Understand basic principles of dietary content, such as calories, fat, sodium, cholesterol and nutrients.;Short Term Goal: A plan has been  developed with personal nutrition goals set during dietitian appointment.;Long Term Goal: Adherence to prescribed nutrition plan.             Goals reviewed with patient; copy given to patient.

## 2020-12-02 NOTE — Progress Notes (Signed)
Daily Session Note  Patient Details  Name: RAMSEY GUADAMUZ MRN: [id number] Date of Birth: 1950/06/04 Referring Provider:   Flowsheet Row Cardiac Rehab from 08/15/2020 in [facility] Card[facility] Rehab  Referring Provider Teena Dunk MD       Encounter Date: 12/02/2020  Check In:  Session Check In - 12/02/20 1005       Check-In   Supervising physician immediately available to respond to emergencies See telemetry face sheet for immediately available ER MD    Location [facility]-Cardiac & Pulmonary Rehab    Staff Present Heath Lark, RN, BSN, Laveda Norman, BS, ACSM CEP, Exercise Physiologist;Amanda Oletta Darter, BA, ACSM CEP, Exercise Physiologist    Virtual Visit No    Medication changes reported     No    Fall or balance concerns reported    No    Warm-up and Cool-down Performed on first and last piece of equipment    Resistance Training Performed Yes    VAD Patient? No    PAD/SET Patient? No      Pain Assessment   Currently in Pain? No/denies                Social History   Tobacco Use  Smoking Status Former   Packs/day: 2.00   Years: 43.00   Pack years: 86.00   Types: Cigarettes   Quit date: 2015   Years since quitting: 7.8  Smokeless Tobacco Never    Goals Met:  Independence with exercise equipment Exercise tolerated well Personal goals reviewed No report of concerns or symptoms today  Goals Unmet:  Not Applicable  Comments:  [name] graduated today from  rehab with 36 sessions completed.  Details of the patient's exercise prescription and what He needs to do in order to continue the prescription and progress were discussed with patient.  Patient was given a copy of prescription and goals.  Patient verbalized understanding.  David plans to continue to exercise by joining the [facility]  Dr. Emily Filbert is Medical Director for [place].  Dr. Ottie Glazier is Medical Director for [facility] Pulmonary Rehabilitation.

## 2020-12-02 NOTE — Progress Notes (Signed)
Cardiac Individual Treatment Plan  Patient Details  Name: Peter Frey MRN: [id number] Date of Birth: 07/10/1950 Referring Provider:   Flowsheet Row Cardiac Rehab from 08/15/2020 in [facility] Cardiac and Pulmonary Rehab  Referring Provider Teena Dunk MD       Initial Encounter Date:  Flowsheet Row Cardiac Rehab from 08/15/2020 in [facility] Cardiac and Pulmonary Rehab  Date 08/15/20       Visit Diagnosis: Heart failure, chronic systolic ([place])  Patient's Home Medications on Admission:  Current Outpatient Medications:    acetaminophen (TYLENOL) 500 MG tablet, Take 500 mg by mouth every 6 (six) hours as needed., Disp: , Rfl:    albuterol (VENTOLIN HFA) 108 (90 Base) MCG/ACT inhaler, Inhale into the lungs., Disp: , Rfl:    aspirin 81 MG tablet, Take 81 mg by mouth daily., Disp: , Rfl:    atorvastatin (LIPITOR) 80 MG tablet, TAKE ONE-HALF TABLET BY MOUTH AT BEDTIME FOR HEART AND CHOLESTEROL., Disp: , Rfl:    empagliflozin (JARDIANCE) 25 MG TABS tablet, Take 0.5 tablets by mouth daily., Disp: , Rfl:    ezetimibe (ZETIA) 10 MG tablet, Take 1 tablet by mouth daily., Disp: , Rfl:    furosemide (LASIX) 40 MG tablet, Take 40 mg by mouth daily. , Disp: , Rfl:    gabapentin (NEURONTIN) 300 MG capsule, Take 300 mg by mouth 3 (three) times daily., Disp: , Rfl:    losartan (COZAAR) 25 MG tablet, Take 25 mg by mouth daily. , Disp: , Rfl:    metoprolol (LOPRESSOR) 50 MG tablet, Take 50 mg by mouth 2 (two) times daily. (Patient not taking: Reported on 08/14/2020), Disp: , Rfl:    metoprolol succinate (TOPROL-XL) 100 MG 24 hr tablet, TAKE ONE-HALF TABLET BY MOUTH EVERY DAY FOR HEART AND BLOOD PRESSURE., Disp: , Rfl:    sildenafil (VIAGRA) 50 MG tablet, Take by mouth as needed.  (Patient not taking: Reported on 08/14/2020), Disp: , Rfl:    simvastatin (ZOCOR) 20 MG tablet, Take 20 mg by mouth daily. (Patient not taking: Reported on 08/14/2020), Disp: , Rfl:    spironolactone (ALDACTONE) 25 MG tablet, Take 25 mg by mouth  daily. , Disp: , Rfl:    TIOTROPIUM BROMIDE-OLODATEROL IN, Inhale 2 Inhalers into the lungs daily., Disp: , Rfl:   Past Medical History: Past Medical History:  Diagnosis Date   CHF (congestive heart failure) (HCC)    COPD (chronic obstructive pulmonary disease) (HCC)    Coronary artery disease    MI (myocardial infarction) ([place])     Tobacco Use: Social History   Tobacco Use  Smoking Status Former   Packs/day: 2.00   Years: 43.00   Pack years: 86.00   Types: Cigarettes   Quit date: 2015   Years since quitting: 7.8  Smokeless Tobacco Never    Labs: Recent Review Management consultant for [facility] Cardiac and Pulmonary Rehab Latest Ref Rng & Units 10/18/2018   Cholestrol 0 - 200 mg/dL 94   LDLCALC 0 - 99 mg/dL 29   HDL >40 mg/dL 50   Trlycerides <150 mg/dL 73        Exercise Target Goals: Exercise Program Goal: Individual exercise prescription set using results from initial 6 min walk test and THRR while considering  patient's activity barriers and safety.   Exercise Prescription Goal: Initial exercise prescription builds to 30-45 minutes a day of aerobic activity, 2-3 days per week.  Home exercise guidelines will be given to patient during program as part  of exercise prescription that the participant will acknowledge.   Education: Aerobic Exercise: - Group verbal and visual presentation on the components of exercise prescription. Introduces F.I.T.T principle from ACSM for exercise prescriptions.  Reviews F.I.T.T. principles of aerobic exercise including progression. Written material given at graduation. Flowsheet Row Cardiac Rehab from 09/04/2020 in [facility] Cardiac and Pulmonary Rehab  Date 09/04/20  Educator [facility]  Instruction Review Code 1- Verbalizes Understanding       Education: Resistance Exercise: - Group verbal and visual presentation on the components of exercise prescription. Introduces F.I.T.T principle from ACSM for exercise prescriptions  Reviews F.I.T.T.  principles of resistance exercise including progression. Written material given at graduation.    Education: Exercise & Equipment Safety: - Individual verbal instruction and demonstration of equipment use and safety with use of the equipment. Flowsheet Row Cardiac Rehab from 09/04/2020 in [facility] Cardiac and Pulmonary Rehab  Date 08/15/20  Educator [facility]  Instruction Review Code 1- Verbalizes Understanding       Education: Exercise Physiology & General Exercise Guidelines: - Group verbal and written instruction with models to review the exercise physiology of the cardiovascular system and associated critical values. Provides general exercise guidelines with specific guidelines to those with heart or lung disease.  Flowsheet Row Cardiac Rehab from 09/04/2020 in [facility] Cardiac and Pulmonary Rehab  Date 08/28/20  Educator [facility]  Instruction Review Code 1- Verbalizes Understanding       Education: Flexibility, Balance, Mind/Body Relaxation: - Group verbal and visual presentation with interactive activity on the components of exercise prescription. Introduces F.I.T.T principle from ACSM for exercise prescriptions. Reviews F.I.T.T. principles of flexibility and balance exercise training including progression. Also discusses the mind body connection.  Reviews various relaxation techniques to help reduce and manage stress (i.e. Deep breathing, progressive muscle relaxation, and visualization). Balance handout provided to take home. Written material given at graduation. Flowsheet Row Cardiac Rehab from 12/15/2018 in [facility] Cardiac and Pulmonary Rehab  Date 12/15/18  Educator [facility]  Instruction Review Code 1- Verbalizes Understanding       Activity Barriers & Risk Stratification:  Activity Barriers & Cardiac Risk Stratification - 08/15/20 0953       Activity Barriers & Cardiac Risk Stratification   Activity Barriers Shortness of Breath;Deconditioning;Muscular Weakness;Other (comment)    Comments legs  burn/cramp with walking (PAD)    Cardiac Risk Stratification High             6 Minute Walk:  6 Minute Walk     Row Name 08/15/20 0945 11/13/20 0935       6 Minute Walk   Phase Initial Discharge    Distance 1200 feet 1505 feet    Distance % Change -- 25.4 %    Distance Feet Change -- 305 ft    Walk Time 6 minutes 6 minutes    # of Rest Breaks 0 0    MPH 2.27 2.85    METS 3.04 3.98    RPE 10 14    Perceived Dyspnea  1 0    VO2 Peak 10.63 13.92    Symptoms Yes (comment) Yes (comment)    Comments bilateral legs buring 4/10, SOB bilateral legs buring 7/10    Resting HR 65 bpm 74 bpm    Resting BP 132/72 136/74    Resting Oxygen Saturation  98 % 97 %    Exercise Oxygen Saturation  during 6 min walk 98 % 98 %    Max Ex. HR 78 bpm 97 bpm  Max Ex. BP 136/74 158/74    2 Minute Post BP 128/70 --             Oxygen Initial Assessment:   Oxygen Re-Evaluation:   Oxygen Discharge (Final Oxygen Re-Evaluation):   Initial Exercise Prescription:  Initial Exercise Prescription - 08/15/20 0900       Date of Initial Exercise RX and Referring Provider   Date 08/15/20    Referring Provider Teena Dunk MD      Treadmill   MPH 2.2    Grade 1    Minutes 15    METs 2.99      Arm Ergometer   Level 2    Watts 40    RPM 30    Minutes 15    METs 2      REL-XR   Level 2    Speed 50    Minutes 15    METs 2      T5 Nustep   Level 3    SPM 80    Minutes 15    METs 2      Track   Laps 31    Minutes 15    METs 2.69      Prescription Details   Frequency (times per week) 3    Duration Progress to 30 minutes of continuous aerobic without signs/symptoms of physical distress      Intensity   THRR 40-80% of Max Heartrate 100-135    Ratings of Perceived Exertion 11-13    Perceived Dyspnea 0-4      Progression   Progression Continue to progress workloads to maintain intensity without signs/symptoms of physical distress.      Resistance Training   Training  Prescription Yes    Weight 4 lb    Reps 10-15             Perform Capillary Blood Glucose checks as needed.  Exercise Prescription Changes:   Exercise Prescription Changes     Row Name 08/15/20 0900 08/19/20 1600 09/02/20 1400 09/13/20 1000 09/18/20 1500     Response to Exercise   Blood Pressure (Admit) 132/76 100/58 102/60 -- 132/70   Blood Pressure (Exercise) 136/74 122/80 118/62 -- 132/60   Blood Pressure (Exit) 128/70 114/68 98/58 -- 104/62   Heart Rate (Admit) 65 bpm 71 bpm 59 bpm -- 90 bpm   Heart Rate (Exercise) 78 bpm 100 bpm 88 bpm -- 103 bpm   Heart Rate (Exit) 66 bpm 81 bpm 62 bpm -- 66 bpm   Oxygen Saturation (Admit) 98 % -- -- -- --   Oxygen Saturation (Exercise) 98 % -- -- -- --   Rating of Perceived Exertion (Exercise) _0 -- 15   Perceived Dyspnea (Exercise) 1 -- -- -- --   Symptoms legs buring 4/10, SOB none none -- none   Comments walk test results first full day of exercise -- -- --   Duration -- Progress to 30 minutes of  aerobic without signs/symptoms of physical distress Continue with 30 min of aerobic exercise without signs/symptoms of physical distress. -- Continue with 30 min of aerobic exercise without signs/symptoms of physical distress.   Intensity -- THRR unchanged THRR unchanged -- THRR unchanged     Progression   Progression -- Continue to progress workloads to maintain intensity without signs/symptoms of physical distress. Continue to progress workloads to maintain intensity without signs/symptoms of physical distress. -- Continue to progress workloads to maintain intensity without signs/symptoms of physical distress.  Average METs -- 2.55 3.3 -- 2.87     Resistance Training   Training Prescription -- Yes Yes -- Yes   Weight -- 4 lb 4 lb -- 5 lb   Reps -- 10-15 10-15 -- 10-15     Interval Training   Interval Training -- No No -- No     Recumbant Bike   Level -- 1.4 -- -- --   Minutes -- 15 -- -- --   METs -- 3 -- -- --      NuStep   Level -- 3 -- -- 2   Minutes -- 15 -- -- 15   METs -- 2.1 -- -- 2.2     Recumbant Elliptical   Level -- -- -- -- 4   Minutes -- -- -- -- 15   METs -- -- -- -- 3.8     REL-XR   Level -- -- 2 -- --   Minutes -- -- 15 -- --   METs -- -- 3.9 -- --     Track   Laps -- -- 30 -- 30   Minutes -- -- 15 -- 15   METs -- -- 2.63 -- 2.63     Home Exercise Plan   Plans to continue exercise at -- -- -- Home (comment)  walking, treadmill Home (comment)  walking, treadmill   Frequency -- -- -- Add 2 additional days to program exercise sessions. Add 2 additional days to program exercise sessions.   Initial Home Exercises Provided -- -- -- 09/13/20 09/13/20    Row Name 09/30/20 1700 10/14/20 1200 10/29/20 0800 11/11/20 1000 11/27/20 1600     Response to Exercise   Blood Pressure (Admit) 104/62 118/64 104/64 128/62 124/62   Blood Pressure (Exit) 102/60 122/60 122/86 122/60 98/58   Heart Rate (Admit) 65 bpm 67 bpm 70 bpm 88 bpm 91 bpm   Heart Rate (Exercise) 81 bpm 98 bpm 81 bpm 106 bpm 107 bpm   Heart Rate (Exit) 66 bpm 75 bpm 75 bpm 89 bpm 92 bpm   Oxygen Saturation (Admit) -- -- -- 95 % 98 %   Oxygen Saturation (Exercise) -- -- -- 97 % 96 %   Oxygen Saturation (Exit) -- -- -- 97 % 98 %   Rating of Perceived Exertion (Exercise) _0 Symptoms _1    Duration Continue with 30 min of aerobic exercise without signs/symptoms of physical distress. Continue with 30 min of aerobic exercise without signs/symptoms of physical distress. Continue with 30 min of aerobic exercise without signs/symptoms of physical distress. Continue with 30 min of aerobic exercise without signs/symptoms of physical distress. Continue with 30 min of aerobic exercise without signs/symptoms of physical distress.   Intensity _2      Progression   Progression Continue to progress workloads to maintain intensity without  signs/symptoms of physical distress. Continue to progress workloads to maintain intensity without signs/symptoms of physical distress. Continue to progress workloads to maintain intensity without signs/symptoms of physical distress. Continue to progress workloads to maintain intensity without signs/symptoms of physical distress. Continue to progress workloads to maintain intensity without signs/symptoms of physical distress.   Average METs 3.9 3.26 3.72 3.42 2.6     Resistance Training   Training Prescription _3    Weight 5 lb 5 lb 5 lb 5 lb 5 lb   Reps 10-15 10-15 10-15 10-15 10-15  Interval Training   Interval Training _0      Recumbant Elliptical   Level -- -- -- 2 3   Minutes -- -- -- 15 15   METs -- -- -- 2.5 1.8     REL-XR   Level _1 --   Minutes _2 --   METs 4.5 4.5 4.1 4.7 --     Track   Laps 42 43 43 38 43   Minutes _3 METs 3.28 3.34 3.34 3.07 3.34     Home Exercise Plan   Plans to continue exercise at Home (comment)  walking, treadmill Home (comment)  walking, treadmill Home (comment)  walking, treadmill Home (comment)  walking, treadmill Home (comment)  walking, treadmill   Frequency Add 2 additional days to program exercise sessions. Add 2 additional days to program exercise sessions. Add 2 additional days to program exercise sessions. Add 2 additional days to program exercise sessions. Add 2 additional days to program exercise sessions.   Initial Home Exercises Provided 09/13/20 09/13/20 09/13/20 09/13/20 09/13/20     Oxygen   Maintain Oxygen Saturation -- -- -- 88% or higher 88% or higher            Exercise Comments:   Exercise Comments     Row Name 08/19/20 1020 12/02/20 1009         Exercise Comments First full day of exercise!  Patient was oriented to gym and equipment including functions, settings, policies, and procedures.  Patient's individual exercise prescription and treatment plan were  reviewed.  All starting workloads were established based on the results of the 6 minute walk test done at initial orientation visit.  The plan for exercise progression was also introduced and progression will be customized based on patient's performance and goals. [name] graduated today from  rehab with 36 sessions completed.  Details of the patient's exercise prescription and what He needs to do in order to continue the prescription and progress were discussed with patient.  Patient was given a copy of prescription and goals.  Patient verbalized understanding.  David plans to continue to exercise by joining the [facility]               Exercise Goals and Review:   Exercise Goals     Row Name 08/15/20 0955             Exercise Goals   Increase Physical Activity Yes       Intervention Provide advice, education, support and counseling about physical activity/exercise needs.;Develop an individualized exercise prescription for aerobic and resistive training based on initial evaluation findings, risk stratification, comorbidities and participant's personal goals.       Expected Outcomes Short Term: Attend rehab on a regular basis to increase amount of physical activity.;Long Term: Add in home exercise to make exercise part of routine and to increase amount of physical activity.;Long Term: Exercising regularly at least 3-5 days a week.       Increase Strength and Stamina Yes       Intervention Provide advice, education, support and counseling about physical activity/exercise needs.;Develop an individualized exercise prescription for aerobic and resistive training based on initial evaluation findings, risk stratification, comorbidities and participant's personal goals.       Expected Outcomes Short Term: Increase workloads from initial exercise prescription for resistance, speed, and METs.;Short Term: Perform resistance training exercises routinely during rehab and add in resistance training at  home;Long Term: Improve cardiorespiratory fitness, muscular endurance and strength as measured by increased METs and functional capacity (6MWT)       Able to understand and use rate of perceived exertion (RPE) scale Yes       Intervention Provide education and explanation on how to use RPE scale       Expected Outcomes Short Term: Able to use RPE daily in rehab to express subjective intensity level;Long Term:  Able to use RPE to guide intensity level when exercising independently       Able to understand and use Dyspnea scale Yes       Intervention Provide education and explanation on how to use Dyspnea scale       Expected Outcomes Short Term: Able to use Dyspnea scale daily in rehab to express subjective sense of shortness of breath during exertion;Long Term: Able to use Dyspnea scale to guide intensity level when exercising independently       Knowledge and understanding of Target Heart Rate Range (THRR) Yes       Intervention Provide education and explanation of THRR including how the numbers were predicted and where they are located for reference       Expected Outcomes Short Term: Able to state/look up THRR;Short Term: Able to use daily as guideline for intensity in rehab;Long Term: Able to use THRR to govern intensity when exercising independently       Able to check pulse independently Yes       Intervention Provide education and demonstration on how to check pulse in carotid and radial arteries.;Review the importance of being able to check your own pulse for safety during independent exercise       Expected Outcomes Short Term: Able to explain why pulse checking is important during independent exercise;Long Term: Able to check pulse independently and accurately       Understanding of Exercise Prescription Yes       Intervention Provide education, explanation, and written materials on patient's individual exercise prescription       Expected Outcomes Short Term: Able to explain program  exercise prescription;Long Term: Able to explain home exercise prescription to exercise independently                Exercise Goals Re-Evaluation :  Exercise Goals Re-Evaluation     Row Name 08/19/20 1020 09/02/20 1501 09/04/20 0932 09/13/20 1023 09/18/20 1524     Exercise Goal Re-Evaluation   Exercise Goals Review Able to understand and use rate of perceived exertion (RPE) scale;Able to understand and use Dyspnea scale;Knowledge and understanding of Target Heart Rate Range (THRR);Understanding of Exercise Prescription Increase Physical Activity;Increase Strength and Stamina Increase Physical Activity;Increase Strength and Stamina Increase Physical Activity;Increase Strength and Stamina;Understanding of Exercise Prescription Increase Physical Activity;Increase Strength and Stamina;Understanding of Exercise Prescription   Comments Reviewed RPE and dyspnea scales, THR and program prescription with pt today.  Pt voiced understanding and was given a copy of goals to take home. [name] is doing well so far in rehab.  He attends consistently and works at [employer].  We will continue to monitor progress. [name] is not exercising at all at home right now. In-between rehab he walked some, but was sick for the most part. He continues to walk a bit , but does not do much. EP to go over home exercise. Reviewed home exercise with pt today.  Pt plans to walk and use treadmill at home for exercise.  We also talked about using staff videos  for home use.  Reviewed THR, pulse, RPE, sign and symptoms, pulse oximetery and when to call 911 or MD.  Also discussed weather considerations and indoor options.  Pt voiced understanding. [name] is doing well in rehab.  He is holdong steady at 30 laps.  We talked today about aiming for a mile on the track again.  He was agreeable.  We will continue to montior his progress.   Expected Outcomes Short: Use RPE daily to regulate intensity. Long: Follow program prescription in THR. Short:  attend consistently Long:  build overall stamina Short: attend consistently, EP to go over home exercise Long:  build overall stamina Short: Start to walk more on off days Long: Continue to improve stamina. Short: Aim for a mile on the track Long; Continue to improve stamina.    [place] Name 09/30/20 7858 09/30/20 1721 10/14/20 1244 10/29/20 0818 11/11/20 1046     Exercise Goal Re-Evaluation   Exercise Goals Review Increase Physical Activity;Increase Strength and Stamina;Understanding of Exercise Prescription Increase Physical Activity;Increase Strength and Stamina Increase Physical Activity;Increase Strength and Stamina Increase Physical Activity;Increase Strength and Stamina Increase Physical Activity;Increase Strength and Stamina;Understanding of Exercise Prescription   Comments [name] is doing well in rehab.  He has not been doing his exercise at home. They do walk around the neighborhood some twice a week for about 30-45 min.  They are not walking very fast, so we talked about walking fast enough to get into his target heart rate range. He does note that his strenght and stamina are getting better and his heart feels better overall. -- [name] is doing well in rehab. He has increased to a number of max laps of 43 on the track. RPEs have been in appropriate range. Will continue to monitor. Today was Davids first day back after being out with Covid.  He did well and made 43 laps on the track! [name] is doing well in rehab.  He is nearing graduation and we expect to see an improvement in his post 6MWT.  He is up to 38 laps on the track and doing level 6 on the XR.  We will continue to monitor his progress.   Expected Outcomes Short: Walk faster on off days in THR Long: Continue to improve stamina -- Short: Continue building up laps on the track Long: Continue to increase overall MET level Short:  get back to consistent attendance Long:  get back to pre Covid stamina Short: Improve post 6MWT Long: Continue to  improve stamina    Row Name 11/15/20 0935             Exercise Goal Re-Evaluation   Exercise Goals Review Increase Physical Activity;Increase Strength and Stamina;Understanding of Exercise Prescription       Comments [name] has done well in rehab. He improved his post walk by 305 ft.  He is planning to continue to exercise by walking at home and going to track.  He is going to try to keep up with his wife.       Expected Outcomes Continue to exercise independently                Discharge Exercise Prescription (Final Exercise Prescription Changes):  Exercise Prescription Changes - 11/27/20 1600       Response to Exercise   Blood Pressure (Admit) 124/62    Blood Pressure (Exit) 98/58    Heart Rate (Admit) 91 bpm    Heart Rate (Exercise) 107 bpm    Heart Rate (Exit)  92 bpm    Oxygen Saturation (Admit) 98 %    Oxygen Saturation (Exercise) 96 %    Oxygen Saturation (Exit) 98 %    Rating of Perceived Exertion (Exercise) 14    Symptoms none    Duration Continue with 30 min of aerobic exercise without signs/symptoms of physical distress.    Intensity THRR unchanged      Progression   Progression Continue to progress workloads to maintain intensity without signs/symptoms of physical distress.    Average METs 2.6      Resistance Training   Training Prescription Yes    Weight 5 lb    Reps 10-15      Interval Training   Interval Training No      Recumbant Elliptical   Level 3    Minutes 15    METs 1.8      Track   Laps 43    Minutes 15    METs 3.34      Home Exercise Plan   Plans to continue exercise at Home (comment)   walking, treadmill   Frequency Add 2 additional days to program exercise sessions.    Initial Home Exercises Provided 09/13/20      Oxygen   Maintain Oxygen Saturation 88% or higher             Nutrition:  Target Goals: Understanding of nutrition guidelines, daily intake of sodium <1586m, cholesterol <2020m calories 30% from fat and 7%  or less from saturated fats, daily to have 5 or more servings of fruits and vegetables.  Education: All About Nutrition: -Group instruction provided by verbal, written material, interactive activities, discussions, models, and posters to present general guidelines for heart healthy nutrition including fat, fiber, MyPlate, the role of sodium in heart healthy nutrition, utilization of the nutrition label, and utilization of this knowledge for meal planning. Follow up email sent as well. Written material given at graduation. Flowsheet [facility]  Education need identified 08/15/20       Biometrics:  Pre Biometrics - 08/15/20 0955       Pre Biometrics   Height 6' (1.829 m)    Weight 168 lb 14.4 oz (76.6 kg)    BMI (Calculated) 22.9    Single Leg Stand 18.3 seconds             Post Biometrics - 11/13/20 0935        Post  Biometrics   Height 6' (1.829 m)    Weight 162 lb 3.2 oz (73.6 kg)    BMI (Calculated) 21.99    Single Leg Stand 23.6 seconds             Nutrition Therapy Plan and Nutrition Goals:  Nutrition Therapy & Goals - 09/02/20 1000       Nutrition Therapy   Diet Heart healthy, low Na    Drug/Food Interactions Statins/Certain Fruits    Protein (specify units) 60g    Fiber 30 grams    Whole Grain Foods 3 servings    Saturated Fats 12 max. grams    Fruits and Vegetables 8 servings/day    Sodium 1.5 grams      Personal Nutrition Goals   Nutrition Goal ST: continue with curent diet, include 1-2 cups water instead of sugar sweetened beverage LT: limit sugar sweetened beverages to <1-2 per day.    Comments DaShanon Browas seen by this RD when last in rehab. Reviewed heart healthy eating  education. He does not eat breakfast everyday, they bake a lot and are using more spices now as they have cut back on . He snacks a lot on fruit and nuts. They get a good variety of fruits and vegetables. They eat chicken mostly,  [place], fish (perch mostly) - [name] doesn't like seafood very much.  Drinks: he doesn't drink water, he drinks regular mountain dew, sweet tea, orange juice. He is enjoying the way he is currently eating anmd would not like to make any big changes at this time.      Intervention Plan   Intervention Prescribe, educate and counsel regarding individualized specific dietary modifications aiming towards targeted core components such as weight, hypertension, lipid management, diabetes, heart failure and other comorbidities.;Nutrition handout(s) given to patient.    Expected Outcomes Short Term Goal: Understand basic principles of dietary content, such as calories, fat, sodium, cholesterol and nutrients.;Short Term Goal: A plan has been developed with personal nutrition goals set during dietitian appointment.;Long Term Goal: Adherence to prescribed nutrition plan.             Nutrition Assessments:  MEDIFICTS Score Key: ?70 Need to make dietary changes  40-70 Heart Healthy Diet ? 40 Therapeutic Level Cholesterol Diet  Flowsheet Row Cardiac Rehab from 11/27/2020 in [facility] Cardiac and Pulmonary Rehab  Picture Your Plate Total Score on Admission 63  Picture Your Plate Total Score on Discharge 59      Picture Your Plate Scores: <48 Unhealthy dietary pattern with much room for improvement. 41-50 Dietary pattern unlikely to meet recommendations for good health and room for improvement. 51-60 More healthful dietary pattern, with some room for improvement.  >60 Healthy dietary pattern, although there may be some specific behaviors that could be improved.    Nutrition Goals Re-Evaluation:  Nutrition Goals Re-Evaluation     [place] Name 09/30/20 0932 10/30/20 0937 11/15/20 0938         Goals   Current Weight -- 161 lb (73 kg) --     Nutrition Goal Contineu with currentl diet Contineu with currentl weight and diet. Contineu with currentl weight and diet.     Comment [name] is doing well in rehab.   His weight is down some.  He is trying to eat a good balance.  He stays away from fried good and salt.  They have tried a variety of different spices for seasoning. [name] is trying to drink more water and orange juice. He tries to get enough protein in his diet. [name] is drinking more water since the last review and is eating more vegetables. He is eating more fruit and nuts and is feeling better. [name] has done well with his diet.  He is focusing on heart healthy eating and getting lots of fruit and vegetables.     Expected Outcome Short: Continue to increase water intake Long: Continue to eat a balance diet. Short: continue eating newer and healhy foods. Long: maintain curent diet. Continue to focus on heart healthy eating              Nutrition Goals Discharge (Final Nutrition Goals Re-Evaluation):  Nutrition Goals Re-Evaluation - 11/15/20 0938       Goals   Nutrition Goal Contineu with currentl weight and diet.    Comment [name] has done well with his diet.  He is focusing on heart healthy eating and getting lots of fruit and vegetables.    Expected Outcome Continue to focus on heart healthy eating  Psychosocial: Target Goals: Acknowledge presence or absence of significant depression and/or stress, maximize coping skills, provide positive support system. Participant is able to verbalize types and ability to use techniques and skills needed for reducing stress and depression.   Education: Stress, Anxiety, and Depression - Group verbal and visual presentation to define topics covered.  Reviews how body is impacted by stress, anxiety, and depression.  Also discusses healthy ways to reduce stress and to treat/manage anxiety and depression.  Written material given at graduation.   Education: Sleep Hygiene -Provides group verbal and written instruction about how sleep can affect your health.  Define sleep hygiene, discuss sleep cycles and impact of sleep habits. Review good  sleep hygiene tips.    Initial Review & Psychosocial Screening:  Initial Psych Review & Screening - 08/14/20 1038       Initial Review   Current issues with None Identified      Family Dynamics   Good Support System? Yes   wife, sister, brother     Barriers   Psychosocial barriers to participate in program There are no identifiable barriers or psychosocial needs.;The patient should benefit from training in stress management and relaxation.      Screening Interventions   Interventions Encouraged to exercise;Provide feedback about the scores to participant;To provide support and resources with identified psychosocial needs    Expected Outcomes Short Term goal: Utilizing psychosocial counselor, staff and physician to assist with identification of specific Stressors or current issues interfering with healing process. Setting desired goal for each stressor or current issue identified.;Long Term Goal: Stressors or current issues are controlled or eliminated.;Short Term goal: Identification and review with participant of any Quality of Life or Depression concerns found by scoring the questionnaire.;Long Term goal: The participant improves quality of Life and PHQ9 Scores as seen by post scores and/or verbalization of changes             Quality of Life Scores:   Quality of Life - 11/27/20 0908       Quality of Life   Select Quality of Life      Quality of Life Scores   Health/Function Pre 16.68 %    Health/Function Post 20.47 %    Health/Function % Change 22.72 %    Socioeconomic Pre 19.5 %    Socioeconomic Post 17.86 %    Socioeconomic % Change  -8.41 %    Psych/Spiritual Pre 23.07 %    Psych/Spiritual Post 19.36 %    Psych/Spiritual % Change -16.08 %    Family Pre 29.5 %    Family Post 30 %    Family % Change 1.69 %    GLOBAL Pre 20.54 %    GLOBAL Post 21.1 %    GLOBAL % Change 2.73 %            Scores of 19 and below usually indicate a poorer quality of life in these  areas.  A difference of  2-3 points is a clinically meaningful difference.  A difference of 2-3 points in the total score of the Quality of Life Index has been associated with significant improvement in overall quality of life, self-image, physical symptoms, and general health in studies assessing change in quality of life.  PHQ-9: Recent Review Flowsheet Data     Depression screen [facility] 2/9 08/15/2020 08/29/2018   Decreased Interest 0 0   Down, Depressed, Hopeless 0 0   PHQ - 2 Score 0 0   Altered sleeping 0 0  Tired, decreased energy 1 3   Change in appetite 0 0   Feeling bad or failure about yourself  0 0   Trouble concentrating 0 0   Moving slowly or fidgety/restless 0 0   Suicidal thoughts 0 0   PHQ-9 Score 1 3   Difficult doing work/chores Somewhat difficult Not difficult at all      Interpretation of Total Score  Total Score Depression Severity:  1-4 = Minimal depression, 5-9 = Mild depression, 10-14 = Moderate depression, 15-19 = Moderately severe depression, 20-27 = Severe depression   Psychosocial Evaluation and Intervention:  Psychosocial Evaluation - 08/14/20 1043       Psychosocial Evaluation & Interventions   Interventions Encouraged to exercise with the program and follow exercise prescription    Comments [name] is returning to cardiac rehab for systolic heart failure. He states he has noticed a decrease in stamina and is looking forward to working at improving that. He reports no stress or concerns with depression. He is very thankful for his support system (wife, sister, brother) and sees each day as a blessing. He has been managing his heart failure symptoms pretty well and is interested in learning more about it.    Expected Outcomes Short: attend cardiac rehab for education and exercise. Long: develop and maintain positive self care habits.    Continue Psychosocial Services  Follow up required by staff             Psychosocial Re-Evaluation:  Psychosocial  Re-Evaluation     [place] Name 09/04/20 [phone] 09/30/20 0930 10/30/20 0936 11/15/20 0936       Psychosocial Re-Evaluation   Current issues with None Identified None Identified None Identified None Identified    Comments [name] reports having bad days, but doesn't stay down and feels good mentally. He likes to work in his garden and work on his car and he hangs out with his wife. His wife and sisters are a good support system for him. He reports sleeping well. [name] is doing well in rehab. He denies any major stressors at this point.  He and his wife are walking on his off days.  He sleeps well most nights. Patient reports no issues with their current mental states, sleep, stress, depression or anxiety. Will follow up with patient in a few weeks for any changes. [name] has done well in rehab. He has enjoyed coming to class and talking with staff.  He is feeling better and has enjoyed getting back into an exercise routine again.  He is planning to keep walking this time.    Expected Outcomes ST: continue with relaxing hobbies LT: [name] is able to live daily without being as nervous about  the expectation of the [employer] firing Short: Continue to exercise for mental boost Long: Continue to focus on positive. Short: Continue to exercise regularly to support mental health and notify staff of any changes. Long: maintain mental health and well being through teaching of rehab or prescribed medications independently. Continue to walk for mental boost.    Interventions Encouraged to attend Cardiac Rehabilitation for the exercise Encouraged to attend Cardiac Rehabilitation for the exercise Encouraged to attend Cardiac Rehabilitation for the exercise Encouraged to attend Cardiac Rehabilitation for the exercise    Continue Psychosocial Services  Follow up required by staff Follow up required by staff Follow up required by staff --             Psychosocial Discharge (Final Psychosocial Re-Evaluation):  Psychosocial  Re-Evaluation -  11/15/20 0936       Psychosocial Re-Evaluation   Current issues with None Identified    Comments [name] has done well in rehab. He has enjoyed coming to class and talking with staff.  He is feeling better and has enjoyed getting back into an exercise routine again.  He is planning to keep walking this time.    Expected Outcomes Continue to walk for mental boost.    Interventions Encouraged to attend Cardiac Rehabilitation for the exercise             Vocational Rehabilitation: Provide vocational rehab assistance to qualifying candidates.   Vocational Rehab Evaluation & Intervention:  Vocational Rehab - 08/14/20 1043       Initial Vocational Rehab Evaluation & Intervention   Assessment shows need for Vocational Rehabilitation No             Education: Education Goals: Education classes will be provided on a variety of topics geared toward better understanding of heart health and risk factor modification. Participant will state understanding/return demonstration of topics presented as noted by education test scores.  Learning Barriers/Preferences:  Learning Barriers/Preferences - 08/14/20 1043       Learning Barriers/Preferences   Learning Barriers None    Learning Preferences None             General Cardiac Education Topics:  AED/CPR: - Group verbal and written instruction with the use of models to demonstrate the basic use of the AED with the basic ABC's of resuscitation.   Anatomy and Cardiac Procedures: - Group verbal and visual presentation and models provide information about basic cardiac anatomy and function. Reviews the testing methods done to diagnose heart disease and the outcomes of the test results. Describes the treatment choices: Medical Management, Angioplasty, or Coronary Bypass Surgery for treating various heart conditions including Myocardial Infarction, Angina, Valve Disease, and Cardiac Arrhythmias.  Written material given at  graduation.   Medication Safety: - Group verbal and visual instruction to review commonly prescribed medications for heart and lung disease. Reviews the medication, class of the drug, and side effects. Includes the steps to properly store meds and maintain the prescription regimen.  Written material given at graduation.   Intimacy: - Group verbal instruction through game format to discuss how heart and lung disease can affect sexual intimacy. Written material given at graduation.. Flowsheet Row Cardiac Rehab from 09/04/2020 in [facility] Cardiac and Pulmonary Rehab  Date 09/04/20  Educator [facility]  Instruction Review Code 1- Verbalizes Understanding       Know Your Numbers and Heart Failure: - Group verbal and visual instruction to discuss disease risk factors for cardiac and pulmonary disease and treatment options.  Reviews associated critical values for Overweight/Obesity, Hypertension, Cholesterol, and Diabetes.  Discusses basics of heart failure: signs/symptoms and treatments.  Introduces Heart Failure Zone chart for action plan for heart failure.  Written material given at graduation.   Infection Prevention: - Provides verbal and written material to individual with discussion of infection control including proper hand washing and proper equipment cleaning during exercise session. Flowsheet Row Cardiac Rehab from 09/04/2020 in [facility] Cardiac and Pulmonary Rehab  Date 08/15/20  Educator [facility]  Instruction Review Code 1- Verbalizes Understanding       Falls Prevention: - Provides verbal and written material to individual with discussion of falls prevention and safety. Flowsheet Row Cardiac Rehab from 09/04/2020 in [facility] Cardiac and Pulmonary Rehab  Date 08/15/20  Educator [facility]  Instruction Review Code 1- Verbalizes Understanding  Other: -Provides group and verbal instruction on various topics (see comments)   Knowledge Questionnaire Score:  Knowledge Questionnaire Score - 11/27/20 0910        Knowledge Questionnaire Score   Pre Score 26/28 Education: Nutrition, Tobacco    Post Score 26/28             Core Components/Risk Factors/Patient Goals at Admission:  Personal Goals and Risk Factors at Admission - 08/15/20 0957       Core Components/Risk Factors/Patient Goals on Admission    Weight Management Yes;Weight Maintenance    Intervention Weight Management: Develop a combined nutrition and exercise program designed to reach desired caloric intake, while maintaining appropriate intake of nutrient and fiber, sodium and fats, and appropriate energy expenditure required for the weight goal.;Weight Management: Provide education and appropriate resources to help participant work on and attain dietary goals.    Admit Weight 168 lb 14.4 oz (76.6 kg)    Goal Weight: Short Term 168 lb (76.2 kg)    Goal Weight: Long Term 168 lb (76.2 kg)    Expected Outcomes Short Term: Continue to assess and modify interventions until short term weight is achieved;Long Term: Adherence to nutrition and physical activity/exercise program aimed toward attainment of established weight goal;Weight Maintenance: Understanding of the daily nutrition guidelines, which includes 25-35% calories from fat, 7% or less cal from saturated fats, less than 241m cholesterol, less than 1.5gm of sodium, & 5 or more servings of fruits and vegetables daily    Intervention Provide a combined exercise and nutrition program that is supplemented with education, support and counseling about heart failure. Directed toward relieving symptoms such as shortness of breath, decreased exercise tolerance, and extremity edema.    Expected Outcomes Improve functional capacity of life;Short term: Attendance in program 2-3 days a week with increased exercise capacity. Reported lower sodium intake. Reported increased fruit and vegetable intake. Reports medication compliance.;Short term: Daily weights obtained and reported for increase.  Utilizing diuretic protocols set by physician.;Long term: Adoption of self-care skills and reduction of barriers for early signs and symptoms recognition and intervention leading to self-care maintenance.    Hypertension Yes    Intervention Provide education on lifestyle modifcations including regular physical activity/exercise, weight management, moderate sodium restriction and increased consumption of fresh fruit, vegetables, and low fat dairy, alcohol moderation, and smoking cessation.;Monitor prescription use compliance.    Expected Outcomes Short Term: Continued assessment and intervention until BP is < 140/981mHG in hypertensive participants. < 130/808mG in hypertensive participants with diabetes, heart failure or chronic kidney disease.;Long Term: Maintenance of blood pressure at goal levels.    Lipids Yes    Intervention Provide education and support for participant on nutrition & aerobic/resistive exercise along with prescribed medications to achieve LDL <81m18mDL >40mg41m Expected Outcomes Short Term: Participant states understanding of desired cholesterol values and is compliant with medications prescribed. Participant is following exercise prescription and nutrition guidelines.;Long Term: Cholesterol controlled with medications as prescribed, with individualized exercise RX and with personalized nutrition plan. Value goals: LDL < 81mg,16m > 40 mg.             Education:Diabetes - Individual verbal and written instruction to review signs/symptoms of diabetes, desired ranges of glucose level fasting, after meals and with exercise. Acknowledge that pre and post exercise glucose checks will be done for 3 sessions at entry of program.   Core Components/Risk Factors/Patient Goals Review:   Goals and Risk Factor Review  [place] Name 09/04/20 3614 09/30/20 0935 10/30/20 0933 11/15/20 0939       Core Components/Risk Factors/Patient Goals Review   Personal Goals Review Weight  Management/Obesity;Diabetes;Hypertension Weight Management/Obesity;Hypertension Weight Management/Obesity;Hypertension Weight Management/Obesity;Hypertension    Review [name] doesn't normally check his BG at home - he is diet controlled. His BP is doing well, he checks it most days (114-116/60s) - today his BP was 112/64. His weight has been generally stable, but he lost a few pounds since last in rehab. He continues to take medications as directed - no issues. [name] is doing well in rehab.  His weight is down some today to 166 lb. His pressures are doing well and he checks them at home.  He is continuing to keep his diabetes is check with his diet and exercise. He is doing well with his medications. [name] is happy with his current weight. He is 161 pounds and is doing well checking his blood pressures at home. His blood pressure has been stable at rest and while exercising. [name] states that his blood sugars are good and his A1c. [name] has done well maintaining his weight.  His pressures have continued to do well.  He continues to check it at home.    Expected Outcomes Short: continue to attend rehab  Long: Continue to monitor risk factors. Short: Continue to work on weight loss Long: Continue to monitor risk factors. Short: maintain weight and bllod sugar. Long: keep stable levels of blood sugar and weight independently. Continue to monitor risk factors.             Core Components/Risk Factors/Patient Goals at Discharge (Final Review):   Goals and Risk Factor Review - 11/15/20 0939       Core Components/Risk Factors/Patient Goals Review   Personal Goals Review Weight Management/Obesity;Hypertension    Review [name] has done well maintaining his weight.  His pressures have continued to do well.  He continues to check it at home.    Expected Outcomes Continue to monitor risk factors.             ITP Comments:  ITP Comments     [place] Name 08/14/20 1052 08/15/20 0944 08/19/20 1019 08/21/20  0658 09/02/20 1145   ITP Comments Initial telephone orientation completed. Diagnosis can be found in [place] records in [place]. EP orientation scheduled for Thursday 8/4 at 8am. Completed 6MWT and gym orientation. Initial ITP created and sent for review to Dr. Emily Filbert, Medical Director. First full day of exercise!  Patient was oriented to gym and equipment including functions, settings, policies, and procedures.  Patient's individual exercise prescription and treatment plan were reviewed.  All starting workloads were established based on the results of the 6 minute walk test done at initial orientation visit.  The plan for exercise progression was also introduced and progression will be customized based on patient's performance and goals. 30 Day review completed. Medical Director ITP review done, changes made as directed, and signed approval by Medical Director.     New to program Completed initial RD consultation    [place] Name 09/18/20 0604 10/16/20 0813 11/13/20 0639 12/02/20 1009     ITP Comments 30 Day review completed. Medical Director ITP review done, changes made as directed, and signed approval by Medical Director. 30 day review completed. ITP sent to Dr. Emily Filbert, Medical Director of Cardiac Rehab. Continue with ITP unless changes are made by physician. 30 Day review completed. Medical Director ITP review done, changes made as directed, and signed  approval by Medical Director. [name] graduated today from  rehab with 36 sessions completed.  Details of the patient's exercise prescription and what He needs to do in order to continue the prescription and progress were discussed with patient.  Patient was given a copy of prescription and goals.  Patient verbalized understanding.  David plans to continue to exercise by joining the [facility]             Comments: Discharge ITP

## 2022-01-04 ENCOUNTER — Other Ambulatory Visit: Payer: Self-pay

## 2022-01-04 ENCOUNTER — Inpatient Hospital Stay
Admission: EM | Admit: 2022-01-04 | Discharge: 2022-01-05 | DRG: 314 | Disposition: A | Discharge status: Home / Self Care | Payer: No Typology Code available for payment source | Attending: Internal Medicine | Admitting: Internal Medicine

## 2022-01-04 ENCOUNTER — Emergency Department: Payer: No Typology Code available for payment source

## 2022-01-04 DIAGNOSIS — R531 Weakness: Secondary | ICD-10-CM | POA: Diagnosis present

## 2022-01-04 DIAGNOSIS — Z7982 Long term (current) use of aspirin: Secondary | ICD-10-CM

## 2022-01-04 DIAGNOSIS — R42 Dizziness and giddiness: Secondary | ICD-10-CM | POA: Diagnosis present

## 2022-01-04 DIAGNOSIS — Z95810 Presence of automatic (implantable) cardiac defibrillator: Secondary | ICD-10-CM | POA: Diagnosis not present

## 2022-01-04 DIAGNOSIS — Z4502 Encounter for adjustment and management of automatic implantable cardiac defibrillator: Secondary | ICD-10-CM | POA: Diagnosis present

## 2022-01-04 DIAGNOSIS — I4901 Ventricular fibrillation: Secondary | ICD-10-CM | POA: Diagnosis present

## 2022-01-04 DIAGNOSIS — I5042 Chronic combined systolic (congestive) and diastolic (congestive) heart failure: Secondary | ICD-10-CM | POA: Diagnosis present

## 2022-01-04 DIAGNOSIS — Y828 Other medical devices associated with adverse incidents: Secondary | ICD-10-CM | POA: Diagnosis present

## 2022-01-04 DIAGNOSIS — T82897A Other specified complication of cardiac prosthetic devices, implants and grafts, initial encounter: Principal | ICD-10-CM | POA: Diagnosis present

## 2022-01-04 DIAGNOSIS — I255 Ischemic cardiomyopathy: Secondary | ICD-10-CM | POA: Diagnosis present

## 2022-01-04 DIAGNOSIS — I472 Ventricular tachycardia, unspecified: Secondary | ICD-10-CM | POA: Diagnosis present

## 2022-01-04 DIAGNOSIS — W19XXXA Unspecified fall, initial encounter: Secondary | ICD-10-CM | POA: Diagnosis present

## 2022-01-04 DIAGNOSIS — J449 Chronic obstructive pulmonary disease, unspecified: Secondary | ICD-10-CM | POA: Diagnosis present

## 2022-01-04 DIAGNOSIS — R002 Palpitations: Secondary | ICD-10-CM | POA: Diagnosis present

## 2022-01-04 DIAGNOSIS — Z720 Tobacco use: Secondary | ICD-10-CM | POA: Diagnosis not present

## 2022-01-04 DIAGNOSIS — Z1152 Encounter for screening for COVID-19: Secondary | ICD-10-CM | POA: Diagnosis not present

## 2022-01-04 DIAGNOSIS — Z888 Allergy status to other drugs, medicaments and biological substances status: Secondary | ICD-10-CM

## 2022-01-04 DIAGNOSIS — I25118 Atherosclerotic heart disease of native coronary artery with other forms of angina pectoris: Secondary | ICD-10-CM | POA: Diagnosis present

## 2022-01-04 DIAGNOSIS — I252 Old myocardial infarction: Secondary | ICD-10-CM | POA: Diagnosis not present

## 2022-01-04 DIAGNOSIS — Z66 Do not resuscitate: Secondary | ICD-10-CM | POA: Diagnosis present

## 2022-01-04 DIAGNOSIS — I493 Ventricular premature depolarization: Secondary | ICD-10-CM | POA: Diagnosis present

## 2022-01-04 DIAGNOSIS — F17200 Nicotine dependence, unspecified, uncomplicated: Secondary | ICD-10-CM | POA: Diagnosis present

## 2022-01-04 DIAGNOSIS — I209 Angina pectoris, unspecified: Secondary | ICD-10-CM

## 2022-01-04 DIAGNOSIS — Z951 Presence of aortocoronary bypass graft: Secondary | ICD-10-CM | POA: Diagnosis not present

## 2022-01-04 DIAGNOSIS — Z79899 Other long term (current) drug therapy: Secondary | ICD-10-CM | POA: Diagnosis not present

## 2022-01-04 DIAGNOSIS — I2510 Atherosclerotic heart disease of native coronary artery without angina pectoris: Secondary | ICD-10-CM | POA: Diagnosis present

## 2022-01-04 LAB — CBC WITH DIFFERENTIAL/PLATELET
Abs Immature Granulocytes: 0.01 10*3/uL (ref 0.00–0.07)
Basophils Absolute: 0 10*3/uL (ref 0.0–0.1)
Basophils Relative: 1 %
Eosinophils Absolute: 0.1 10*3/uL (ref 0.0–0.5)
Eosinophils Relative: 1 %
HCT: 45.6 % (ref 39.0–52.0)
Hemoglobin: 14.1 g/dL (ref 13.0–17.0)
Immature Granulocytes: 0 %
Lymphocytes Relative: 50 %
Lymphs Abs: 2.2 10*3/uL (ref 0.7–4.0)
MCH: 23.6 pg — ABNORMAL LOW (ref 26.0–34.0)
MCHC: 30.9 g/dL (ref 30.0–36.0)
MCV: 76.3 fL — ABNORMAL LOW (ref 80.0–100.0)
Monocytes Absolute: 0.4 10*3/uL (ref 0.1–1.0)
Monocytes Relative: 9 %
Neutro Abs: 1.8 10*3/uL (ref 1.7–7.7)
Neutrophils Relative %: 39 %
Platelets: 138 10*3/uL — ABNORMAL LOW (ref 150–400)
RBC: 5.98 MIL/uL — ABNORMAL HIGH (ref 4.22–5.81)
RDW: 16 % — ABNORMAL HIGH (ref 11.5–15.5)
WBC: 4.5 10*3/uL (ref 4.0–10.5)
nRBC: 0 % (ref 0.0–0.2)

## 2022-01-04 LAB — URINALYSIS, ROUTINE W REFLEX MICROSCOPIC
Bacteria, UA: NONE SEEN
Bilirubin Urine: NEGATIVE
Glucose, UA: >=500 mg/dL — AB
Hgb urine dipstick: NEGATIVE
Ketones, ur: 5 mg/dL — AB
Leukocytes,Ua: NEGATIVE
Nitrite: NEGATIVE
Protein, ur: NEGATIVE mg/dL
Specific Gravity, Urine: 1.011 (ref 1.005–1.030)
pH: 5 (ref 5.0–8.0)

## 2022-01-04 LAB — APTT: aPTT: 27 seconds (ref 24–36)

## 2022-01-04 LAB — COMPREHENSIVE METABOLIC PANEL
ALT: 12 U/L (ref 0–44)
AST: 20 U/L (ref 15–41)
Albumin: 4 g/dL (ref 3.5–5.0)
Alkaline Phosphatase: 31 U/L — ABNORMAL LOW (ref 38–126)
Anion gap: 8 (ref 5–15)
BUN: 19 mg/dL (ref 8–23)
CO2: 23 mmol/L (ref 22–32)
Calcium: 8.8 mg/dL — ABNORMAL LOW (ref 8.9–10.3)
Chloride: 110 mmol/L (ref 98–111)
Creatinine, Ser: 1.14 mg/dL (ref 0.61–1.24)
GFR, Estimated: >60 mL/min (ref >60)
Glucose, Bld: 92 mg/dL (ref 70–99)
Potassium: 4.1 mmol/L (ref 3.5–5.1)
Sodium: 141 mmol/L (ref 135–145)
Total Bilirubin: 1 mg/dL (ref 0.3–1.2)
Total Protein: 7.4 g/dL (ref 6.5–8.1)

## 2022-01-04 LAB — TROPONIN I (HIGH SENSITIVITY)
Troponin I (High Sensitivity): 26 ng/L — ABNORMAL HIGH (ref <18)
Troponin I (High Sensitivity): 38 ng/L — ABNORMAL HIGH (ref <18)
Troponin I (High Sensitivity): 28 ng/L — ABNORMAL HIGH (ref <18)
Troponin I (High Sensitivity): 27 ng/L — ABNORMAL HIGH (ref <18)

## 2022-01-04 LAB — PROTIME-INR
INR: 1 (ref 0.8–1.2)
Prothrombin Time: 12.9 seconds (ref 11.4–15.2)

## 2022-01-04 LAB — TSH: TSH: 1.74 u[IU]/mL (ref 0.350–4.500)

## 2022-01-04 LAB — RESP PANEL BY RT-PCR (RSV, FLU A&B, COVID)  RVPGX2
Influenza A by PCR: NEGATIVE
Influenza B by PCR: NEGATIVE
Resp Syncytial Virus by PCR: NEGATIVE
SARS Coronavirus 2 by RT PCR: NEGATIVE

## 2022-01-04 LAB — HEPARIN LEVEL (UNFRACTIONATED)
Heparin Unfractionated: 0.97 IU/mL — ABNORMAL HIGH (ref 0.30–0.70)
Heparin Unfractionated: 0.85 IU/mL — ABNORMAL HIGH (ref 0.30–0.70)

## 2022-01-04 LAB — MAGNESIUM: Magnesium: 2.3 mg/dL (ref 1.7–2.4)

## 2022-01-04 LAB — BRAIN NATRIURETIC PEPTIDE: B Natriuretic Peptide: 358.5 pg/mL — ABNORMAL HIGH (ref 0.0–100.0)

## 2022-01-04 LAB — HIV ANTIBODY (ROUTINE TESTING W REFLEX): HIV Screen 4th Generation wRfx: NONREACTIVE

## 2022-01-04 MED ORDER — SODIUM CHLORIDE 0.9% FLUSH: 3.0000 mL | INTRAVENOUS | Status: DC | PRN

## 2022-01-04 MED ORDER — EZETIMIBE 10 MG PO TABS
10.0000 mg | ORAL_TABLET | Freq: Every day | ORAL | Status: DC
  Administered 2022-01-04 – 2022-01-05 (×2): 10 mg via ORAL
  Filled 2022-01-04 (×2): qty 1

## 2022-01-04 MED ORDER — ACETAMINOPHEN 500 MG PO TABS: 500.0000 mg | ORAL_TABLET | Freq: Four times a day (QID) | ORAL | Status: DC | PRN

## 2022-01-04 MED ORDER — TIOTROPIUM BROMIDE-OLODATEROL 2.5-2.5 MCG/ACT IN AERS: INHALATION_SPRAY | Freq: Every day | RESPIRATORY_TRACT | Status: DC

## 2022-01-04 MED ORDER — FUROSEMIDE 40 MG PO TABS
40.0000 mg | ORAL_TABLET | Freq: Every day | ORAL | Status: DC
  Administered 2022-01-04: 40 mg via ORAL
  Filled 2022-01-04 (×2): qty 1

## 2022-01-04 MED ORDER — HEPARIN BOLUS VIA INFUSION
4000.0000 [IU] | Freq: Once | INTRAVENOUS | Status: AC
  Administered 2022-01-04: 4000 [IU] via INTRAVENOUS
  Filled 2022-01-04: qty 4000

## 2022-01-04 MED ORDER — MAGNESIUM SULFATE 2 GM/50ML IV SOLN
2.0000 g | Freq: Once | INTRAVENOUS | Status: AC
  Administered 2022-01-04: 2 g via INTRAVENOUS
  Filled 2022-01-04: qty 50

## 2022-01-04 MED ORDER — HEPARIN SODIUM (PORCINE) 5000 UNIT/ML IJ SOLN: 4000.0000 [IU] | Freq: Once | INTRAMUSCULAR | Status: DC

## 2022-01-04 MED ORDER — ALBUTEROL SULFATE (2.5 MG/3ML) 0.083% IN NEBU: 2.5000 mg | INHALATION_SOLUTION | RESPIRATORY_TRACT | Status: DC | PRN

## 2022-01-04 MED ORDER — ASPIRIN 81 MG PO TBEC
81.0000 mg | DELAYED_RELEASE_TABLET | Freq: Every day | ORAL | Status: DC
  Administered 2022-01-04 – 2022-01-05 (×2): 81 mg via ORAL
  Filled 2022-01-04 (×2): qty 1

## 2022-01-04 MED ORDER — ONDANSETRON HCL 4 MG PO TABS: 4.0000 mg | ORAL_TABLET | Freq: Four times a day (QID) | ORAL | Status: DC | PRN

## 2022-01-04 MED ORDER — AMIODARONE HCL IN DEXTROSE 360-4.14 MG/200ML-% IV SOLN
30.0000 mg/h | INTRAVENOUS | Status: DC
  Administered 2022-01-04 – 2022-01-05 (×2): 30 mg/h via INTRAVENOUS
  Filled 2022-01-04 (×3): qty 200

## 2022-01-04 MED ORDER — SODIUM CHLORIDE 0.9 % IV SOLN: 250.0000 mL | INTRAVENOUS | Status: DC | PRN

## 2022-01-04 MED ORDER — ONDANSETRON HCL 4 MG/2ML IJ SOLN: 4.0000 mg | Freq: Four times a day (QID) | INTRAMUSCULAR | Status: DC | PRN

## 2022-01-04 MED ORDER — AMIODARONE LOAD VIA INFUSION
150.0000 mg | Freq: Once | INTRAVENOUS | Status: AC
  Administered 2022-01-04: 150 mg via INTRAVENOUS
  Filled 2022-01-04: qty 83.34

## 2022-01-04 MED ORDER — METOPROLOL SUCCINATE ER 50 MG PO TB24
50.0000 mg | ORAL_TABLET | Freq: Every day | ORAL | Status: DC
  Administered 2022-01-04 – 2022-01-05 (×2): 50 mg via ORAL
  Filled 2022-01-04 (×2): qty 1

## 2022-01-04 MED ORDER — ASPIRIN 81 MG PO CHEW: 324.0000 mg | CHEWABLE_TABLET | Freq: Once | ORAL | Status: DC

## 2022-01-04 MED ORDER — UMECLIDINIUM-VILANTEROL 62.5-25 MCG/ACT IN AEPB
1.0000 | INHALATION_SPRAY | Freq: Every day | RESPIRATORY_TRACT | Status: DC
  Administered 2022-01-04: 1 via RESPIRATORY_TRACT
  Filled 2022-01-04: qty 14

## 2022-01-04 MED ORDER — SPIRONOLACTONE 25 MG PO TABS
25.0000 mg | ORAL_TABLET | Freq: Every day | ORAL | Status: DC
  Administered 2022-01-04 – 2022-01-05 (×2): 25 mg via ORAL
  Filled 2022-01-04 (×2): qty 1

## 2022-01-04 MED ORDER — SODIUM CHLORIDE 0.9% FLUSH
3.0000 mL | Freq: Two times a day (BID) | INTRAVENOUS | Status: DC
  Administered 2022-01-04 – 2022-01-05 (×3): 3 mL via INTRAVENOUS

## 2022-01-04 MED ORDER — LOSARTAN POTASSIUM 50 MG PO TABS
25.0000 mg | ORAL_TABLET | Freq: Every day | ORAL | Status: DC
  Administered 2022-01-04 – 2022-01-05 (×2): 25 mg via ORAL
  Filled 2022-01-04 (×2): qty 1

## 2022-01-04 MED ORDER — ATORVASTATIN CALCIUM 20 MG PO TABS
80.0000 mg | ORAL_TABLET | Freq: Every day | ORAL | Status: DC
  Administered 2022-01-04 – 2022-01-05 (×2): 80 mg via ORAL
  Filled 2022-01-04 (×2): qty 4

## 2022-01-04 MED ORDER — HEPARIN (PORCINE) 25000 UT/250ML-% IV SOLN
700.0000 [IU]/h | INTRAVENOUS | Status: DC
  Administered 2022-01-04: 900 [IU]/h via INTRAVENOUS
  Administered 2022-01-05: 700 [IU]/h via INTRAVENOUS
  Filled 2022-01-04 (×2): qty 250

## 2022-01-04 MED ORDER — GABAPENTIN 300 MG PO CAPS
300.0000 mg | ORAL_CAPSULE | Freq: Three times a day (TID) | ORAL | Status: DC
  Administered 2022-01-04 – 2022-01-05 (×2): 300 mg via ORAL
  Filled 2022-01-04 (×2): qty 1

## 2022-01-04 MED ORDER — AMIODARONE HCL IN DEXTROSE 360-4.14 MG/200ML-% IV SOLN
60.0000 mg/h | INTRAVENOUS | Status: AC
  Administered 2022-01-04 (×2): 60 mg/h via INTRAVENOUS
  Filled 2022-01-04: qty 200

## 2022-01-04 NOTE — Consult Note (Signed)
Peter Frey is a 71 y.o. male  [id number]  Primary Cardiologist: Adrian Blackwater Reason for Consultation: Status post ICD discharge  HPI: This is a 71 year old African-American male with a past medical history of CABG three-vessel at [facility] with status post ICD implantation for severe LV dysfunction.  Patient apparently had 1 discharge for ventricular fibrillation and was discharged this morning of his ICD and was found to be in ventricular tachycardia on interrogation.  Patient denied any chest pain prior to this.  Patient is feeling fine right now.   Review of Systems: No orthopnea PND or leg swelling   Past Medical History:  Diagnosis Date   CHF (congestive heart failure) (HCC)    COPD (chronic obstructive pulmonary disease) (HCC)    Coronary artery disease    MI (myocardial infarction) (HCC)     (Not in a hospital admission)     aspirin  81 mg Oral Daily   atorvastatin  80 mg Oral Daily   ezetimibe  10 mg Oral Daily   furosemide  40 mg Oral Daily   gabapentin  300 mg Oral TID   losartan  25 mg Oral Daily   metoprolol succinate  50 mg Oral Daily   sodium chloride flush  3 mL Intravenous Q12H   spironolactone  25 mg Oral Daily   Tiotropium Bromide-Olodaterol   Inhalation Daily    Infusions:  sodium chloride     amiodarone 60 mg/hr (01/04/22 0913)   Followed by   amiodarone     heparin 900 Units/hr (01/04/22 0727)    Allergies  Allergen Reactions   Lisinopril Swelling    Lip swelling    Social History   Socioeconomic History   Marital status: Married    Spouse name: Not on file   Number of children: Not on file   Years of education: Not on file   Highest education level: Not on file  Occupational History   Not on file  Tobacco Use   Smoking status: Former    Packs/day: 2.00    Years: 43.00    Total pack years: 86.00    Types: Cigarettes    Quit date: 2015    Years since quitting: 8.9   Smokeless tobacco: Never  Vaping Use   Vaping  Use: Never used  Substance and Sexual Activity   Alcohol use: Yes    Comment: occas.    Drug use: Not Currently    Types: Cocaine, Marijuana    Comment: quit in the 70's.   Sexual activity: Not on file  Other Topics Concern   Not on file  Social History Narrative   Not on file   Social Determinants of Health   Financial Resource Strain: Not on file  Food Insecurity: Not on file  Transportation Needs: Not on file  Physical Activity: Not on file  Stress: Not on file  Social Connections: Not on file  Intimate Partner Violence: Not on file    Family History  Problem Relation Age of Onset   Aneurysm Mother    Aneurysm Father     PHYSICAL EXAM: Vitals:   01/04/22 1040 01/04/22 1050  BP:    Pulse: 63 67  Resp: 16 20  Temp:    SpO2: 96% 96%     Intake/Output Summary (Last 24 hours) at 01/04/2022 1152 Last data filed at 01/04/2022 0727 Gross per 24 hour  Intake 40.23 ml  Output --  Net 40.23 ml    General:  Well appearing. No respiratory difficulty HEENT: normal Neck: supple. no JVD. Carotids 2+ bilat; no bruits. No lymphadenopathy or thryomegaly appreciated. Cor: PMI nondisplaced. Regular rate & rhythm. No rubs, gallops or murmurs. Lungs: clear Abdomen: soft, nontender, nondistended. No hepatosplenomegaly. No bruits or masses. Good bowel sounds. Extremities: no cyanosis, clubbing, rash, edema Neuro: alert & oriented x 3, cranial nerves grossly intact. moves all 4 extremities w/o difficulty. Affect pleasant.  ECG: Normal sinus rhythm with ischemic ST depression inferolaterally  Results for orders placed or performed during the hospital encounter of 01/04/22 (from the past 24 hour(s))  Brain natriuretic peptide     Status: Abnormal   Collection Time: 01/04/22  6:13 AM  Result Value Ref Range   B Natriuretic Peptide 358.5 (H) 0.0 - 100.0 pg/mL  TSH     Status: None   Collection Time: 01/04/22  6:13 AM  Result Value Ref Range   TSH 1.740 0.350 - 4.500 uIU/mL   Comprehensive metabolic panel     Status: Abnormal   Collection Time: 01/04/22  6:33 AM  Result Value Ref Range   Sodium 141 135 - 145 mmol/L   Potassium 4.1 3.5 - 5.1 mmol/L   Chloride 110 98 - 111 mmol/L   CO2 23 22 - 32 mmol/L   Glucose, Bld 92 70 - 99 mg/dL   BUN 19 8 - 23 mg/dL   Creatinine, Ser 9.32 0.61 - 1.24 mg/dL   Calcium 8.8 (L) 8.9 - 10.3 mg/dL   Total Protein 7.4 6.5 - 8.1 g/dL   Albumin 4.0 3.5 - 5.0 g/dL   AST 20 15 - 41 U/L   ALT 12 0 - 44 U/L   Alkaline Phosphatase 31 (L) 38 - 126 U/L   Total Bilirubin 1.0 0.3 - 1.2 mg/dL   GFR, Estimated >35 >57 mL/min   Anion gap 8 5 - 15  CBC with Differential/Platelet     Status: Abnormal   Collection Time: 01/04/22  6:33 AM  Result Value Ref Range   WBC 4.5 4.0 - 10.5 K/uL   RBC 5.98 (H) 4.22 - 5.81 MIL/uL   Hemoglobin 14.1 13.0 - 17.0 g/dL   HCT 32.2 02.5 - 42.7 %   MCV 76.3 (L) 80.0 - 100.0 fL   MCH 23.6 (L) 26.0 - 34.0 pg   MCHC 30.9 30.0 - 36.0 g/dL   RDW 06.2 (H) 37.6 - 28.3 %   Platelets 138 (L) 150 - 400 K/uL   nRBC 0.0 0.0 - 0.2 %   Neutrophils Relative % 39 %   Neutro Abs 1.8 1.7 - 7.7 K/uL   Lymphocytes Relative 50 %   Lymphs Abs 2.2 0.7 - 4.0 K/uL   Monocytes Relative 9 %   Monocytes Absolute 0.4 0.1 - 1.0 K/uL   Eosinophils Relative 1 %   Eosinophils Absolute 0.1 0.0 - 0.5 K/uL   Basophils Relative 1 %   Basophils Absolute 0.0 0.0 - 0.1 K/uL   Immature Granulocytes 0 %   Abs Immature Granulocytes 0.01 0.00 - 0.07 K/uL  Troponin I (High Sensitivity)     Status: Abnormal   Collection Time: 01/04/22  6:33 AM  Result Value Ref Range   Troponin I (High Sensitivity) 26 (H) <18 ng/L  Magnesium     Status: None   Collection Time: 01/04/22  6:33 AM  Result Value Ref Range   Magnesium 2.3 1.7 - 2.4 mg/dL  Protime-INR     Status: None   Collection Time: 01/04/22  6:33 AM  Result Value Ref Range   Prothrombin Time 12.9 11.4 - 15.2 seconds   INR 1.0 0.8 - 1.2  APTT     Status: None   Collection Time:  01/04/22  6:33 AM  Result Value Ref Range   aPTT 27 24 - 36 seconds  Resp panel by RT-PCR (RSV, Flu A&B, Covid) Anterior Nasal Swab     Status: None   Collection Time: 01/04/22  6:35 AM   Specimen: Anterior Nasal Swab  Result Value Ref Range   SARS Coronavirus 2 by RT PCR NEGATIVE NEGATIVE   Influenza A by PCR NEGATIVE NEGATIVE   Influenza B by PCR NEGATIVE NEGATIVE   Resp Syncytial Virus by PCR NEGATIVE NEGATIVE  Troponin I (High Sensitivity)     Status: Abnormal   Collection Time: 01/04/22  9:14 AM  Result Value Ref Range   Troponin I (High Sensitivity) 38 (H) <18 ng/L  Urinalysis, Routine w reflex microscopic Urine, Clean Catch     Status: Abnormal   Collection Time: 01/04/22  9:20 AM  Result Value Ref Range   Color, Urine STRAW (A) YELLOW   APPearance CLEAR (A) CLEAR   Specific Gravity, Urine 1.011 1.005 - 1.030   pH 5.0 5.0 - 8.0   Glucose, UA >=500 (A) NEGATIVE mg/dL   Hgb urine dipstick NEGATIVE NEGATIVE   Bilirubin Urine NEGATIVE NEGATIVE   Ketones, ur 5 (A) NEGATIVE mg/dL   Protein, ur NEGATIVE NEGATIVE mg/dL   Nitrite NEGATIVE NEGATIVE   Leukocytes,Ua NEGATIVE NEGATIVE   RBC / HPF 0-5 0 - 5 RBC/hpf   WBC, UA 0-5 0 - 5 WBC/hpf   Bacteria, UA NONE SEEN NONE SEEN   Squamous Epithelial / LPF 0-5 0 - 5   DG Chest Portable 1 View  Result Date: 01/04/2022 CLINICAL DATA:  Defibrillator firing EXAM: PORTABLE CHEST 1 VIEW COMPARISON:  10/18/2018 FINDINGS: Single chamber defibrillator lead into the right ventricle, unchanged. Mild cardiomegaly, CABG and coronary stenting. There is no edema, consolidation, effusion, or pneumothorax. IMPRESSION: No evidence of active disease. Electronically Signed   By: Tiburcio Pea M.D.   On: 01/04/2022 06:27     ASSESSMENT AND PLAN: Severe LV dysfunction with ICD implantation and status post V. tach and V-fib.  History of coronary artery disease with three-vessel CABG and on cardiac catheterization 2020 only LIMA was patent with stent in the  left circumflex patent and RCA having 50% disease in LAD having 50 to 60% disease.  Vein graft to D1 and SVG to distal coronary artery branches PDA or PLV were occluded.  He had V. tach and was treated with amiodarone at past time.  Started the patient on IV amiodarone and gave 2 g of magnesium.  Patient has so far insignificant elevation of troponin.  Will continue amiodarone and consider discharge tomorrow with outpatient workup.  Akhil Piscopo A

## 2022-01-04 NOTE — Assessment & Plan Note (Signed)
Patient with a history of ischemic cardiomyopathy with last known LVEF of 35% from a 2D echocardiogram which was done 07/23 who presents for evaluation of 2 episodes of defibrillator discharge. AICD interrogation was done and showed 1 episode of V-fib as well as an episode of nonsustained V. Tach Cardiology was consulted and recommends to place patient on amiodarone drip as well as a heparin drip Place patient on cardiac monitoring Check electrolytes

## 2022-01-04 NOTE — Assessment & Plan Note (Signed)
Stable and not acutely exacerbated Last known LVEF of 35% from a 2D echocardiogram which was done 07/23 Continue furosemide, spironolactone, metoprolol and losartan

## 2022-01-04 NOTE — H&P (Signed)
History and Physical    Patient: Peter Frey JZP:[id number] DOB: 1950/09/16 DOA: 01/04/2022 DOS: the patient was seen and examined on 01/04/2022 PCP: [facility]  Patient coming from: Home  Chief Complaint:  Chief Complaint  Patient presents with   Pacemaker Problem   HPI: Peter Frey is a 71 y.o. male with medical history significant for coronary artery disease, ischemic cardiomyopathy status post AICD placement, COPD who presents to the ER for evaluation of defibrillator discharge. Patient states that he woke up 2 days prior to this admission after his defibrillator shocked him but did not get it checked.  On the morning of his admission he was in the bathroom and had another shock and fell to the ground but denies any loss of consciousness.Marland Kitchen He denies having any chest pain but has had worsening shortness of breath with exertion associated with palpitations and dizziness without loss of consciousness.  He denies having any nausea, no vomiting, no diaphoresis, no leg swelling. His AICD was interrogated in the ER and patient had an episode of V-fib when he had the first defibrillator shock and on the morning of his admission he was V. tach. He denies having any abdominal pain, no fever, no chills, no cough, no urinary symptoms, no blurred vision or focal deficit. Abnormal labs show an uptrending troponin level 26 >> 38 Twelve-lead EKG shows normal sinus rhythm with PVCs Cardiology was consulted in the ER and they recommend initiating amiodarone drip, IV magnesium and heparin drip.  Review of Systems: As mentioned in the history of present illness. All other systems reviewed and are negative. Past Medical History:  Diagnosis Date   CHF (congestive heart failure) (HCC)    COPD (chronic obstructive pulmonary disease) (HCC)    Coronary artery disease    MI (myocardial infarction) (HCC)    Past Surgical History:  Procedure Laterality Date   CARDIAC DEFIBRILLATOR PLACEMENT      CORONARY ARTERY BYPASS GRAFT     CORONARY/GRAFT ACUTE MI REVASCULARIZATION N/A 10/18/2018   Procedure: Coronary/Graft Acute MI Revascularization;  Surgeon: Yvonne Kendall, MD;  Location: [facility] INVASIVE CV LAB;  Service: Cardiovascular;  Laterality: N/A;   INTRAVASCULAR PRESSURE WIRE/FFR STUDY N/A 10/18/2018   Procedure: INTRAVASCULAR PRESSURE WIRE/FFR STUDY;  Surgeon: Yvonne Kendall, MD;  Location: [facility] INVASIVE CV LAB;  Service: Cardiovascular;  Laterality: N/A;  OM   LEFT HEART CATH AND CORS/GRAFTS ANGIOGRAPHY N/A 10/18/2018   Procedure: LEFT HEART CATH AND CORS/GRAFTS ANGIOGRAPHY;  Surgeon: Yvonne Kendall, MD;  Location: [facility] INVASIVE CV LAB;  Service: Cardiovascular;  Laterality: N/A;   Social History:  reports that he quit smoking about 8 years ago. His smoking use included cigarettes. He has a 86.00 pack-year smoking history. He has never used smokeless tobacco. He reports current alcohol use. He reports that he does not currently use drugs after having used the following drugs: Cocaine and Marijuana.  Allergies  Allergen Reactions   Lisinopril Swelling    Lip swelling    Family History  Problem Relation Age of Onset   Aneurysm Mother    Aneurysm Father     Prior to Admission medications   Medication Sig Start Date End Date Taking? Authorizing Provider  acetaminophen (TYLENOL) 500 MG tablet Take 500 mg by mouth every 6 (six) hours as needed.    [provider]  albuterol (VENTOLIN HFA) 108 (90 Base) MCG/ACT inhaler Inhale into the lungs.    [provider]  aspirin 81 MG tablet Take 81 mg by  mouth daily.    [provider]  atorvastatin (LIPITOR) 80 MG tablet TAKE ONE-HALF TABLET BY MOUTH AT BEDTIME FOR HEART AND CHOLESTEROL. 05/16/20   [provider]  empagliflozin (JARDIANCE) 25 MG TABS tablet Take 0.5 tablets by mouth daily. 05/10/20   [provider]  ezetimibe (ZETIA) 10 MG tablet Take 1 tablet by mouth daily. 05/16/20    [provider]  furosemide (LASIX) 40 MG tablet Take 40 mg by mouth daily.     [provider]  gabapentin (NEURONTIN) 300 MG capsule Take 300 mg by mouth 3 (three) times daily.    [provider]  losartan (COZAAR) 25 MG tablet Take 25 mg by mouth daily.     [provider]  metoprolol (LOPRESSOR) 50 MG tablet Take 50 mg by mouth 2 (two) times daily. Patient not taking: Reported on 08/14/2020    [provider]  metoprolol succinate (TOPROL-XL) 100 MG 24 hr tablet TAKE ONE-HALF TABLET BY MOUTH EVERY DAY FOR HEART AND BLOOD PRESSURE. 04/16/20   [provider]  sildenafil (VIAGRA) 50 MG tablet Take by mouth as needed.  Patient not taking: Reported on 08/14/2020    [provider]  simvastatin (ZOCOR) 20 MG tablet Take 20 mg by mouth daily. Patient not taking: Reported on 08/14/2020    [provider]  spironolactone (ALDACTONE) 25 MG tablet Take 25 mg by mouth daily.  08/03/17   [provider]  TIOTROPIUM BROMIDE-OLODATEROL IN Inhale 2 Inhalers into the lungs daily.    [provider]    Physical Exam: Vitals:   01/04/22 0830 01/04/22 0950 01/04/22 0951 01/04/22 1040  BP: 131/74  (!) 146/89   Pulse: (!) 51 67  63  Resp: 12 16 17 16   Temp:   97.9 F (36.6 C)   TempSrc:   Oral   SpO2: 97% 94% 97% 96%  Weight:      Height:       Physical Exam Vitals and nursing note reviewed.  Constitutional:      Appearance: Normal appearance.  HENT:     Head: Normocephalic and atraumatic.     Nose: Nose normal.     Mouth/Throat:     Mouth: Mucous membranes are moist.  Eyes:     Conjunctiva/sclera: Conjunctivae normal.  Cardiovascular:     Rate and Rhythm: Normal rate and regular rhythm.  Pulmonary:     Effort: Pulmonary effort is normal.     Breath sounds: Normal breath sounds.  Abdominal:     General: Abdomen is flat. Bowel sounds are normal.     Palpations: Abdomen is soft.  Musculoskeletal:         General: Normal range of motion.     Cervical back: Normal range of motion and neck supple.  Skin:    General: Skin is warm and dry.  Neurological:     General: No focal deficit present.     Mental Status: He is alert and oriented to person, place, and time.  Psychiatric:        Mood and Affect: Mood normal.        Behavior: Behavior normal.     Data Reviewed: Relevant notes from primary care and specialist visits, past discharge summaries as available in EHR, including Care Everywhere. Prior diagnostic testing as pertinent to current admission diagnoses Updated medications and problem lists for reconciliation ED course, including vitals, labs, imaging, treatment and response to treatment Triage notes, nursing and pharmacy notes and ED provider's  notes Notable results as noted in HPI Labs reviewed.  Sodium 141, potassium 4.1, chloride 110, bicarb 23, glucose 92, BUN 19, creatinine 1.14, calcium 8.8, total protein 7.4, albumin 4.0, AST 20, ALT 12, alkaline phosphatase 31, troponin 26 >> 38, magnesium 2.3, BNP 358, white count 4.5, hemoglobin 14.1, hematocrit 45.6, platelet count 138 Respiratory viral panel is negative Chest x-ray reviewed by me shows no evidence of active disease.  Twelve-lead EKG reviewed by me shows normal sinus rhythm with PVCs There are no new results to review at this time.  Assessment and Plan: * Defibrillator discharge Patient with a history of ischemic cardiomyopathy with last known LVEF of 35% from a 2D echocardiogram which was done 07/23 who presents for evaluation of 2 episodes of defibrillator discharge. AICD interrogation was done and showed 1 episode of V-fib as well as an episode of nonsustained V. Tach Cardiology was consulted and recommends to place patient on amiodarone drip as well as a heparin drip Place patient on cardiac monitoring Check electrolytes  CAD (coronary artery disease) Patient with known triple-vessel disease who presents to the ER  for evaluation of worsening exertional shortness of breath, palpitations and dizziness. Will obtain serial troponin levels Start patient on a heparin drip per cardiology recommendation Continue aspirin, statins, beta-blocker  Chronic obstructive pulmonary disease, unspecified (HCC) Stable and not acutely exacerbated Continue as needed bronchodilator therapy as well as inhaled steroids  Chronic combined systolic (congestive) and diastolic (congestive) heart failure (HCC) Stable and not acutely exacerbated Last known LVEF of 35% from a 2D echocardiogram which was done 07/23 Continue furosemide, spironolactone, metoprolol and losartan  Ventricular tachycardia (HCC) Treatment as outlined in 1      Advance Care Planning:   Code Status: DNR   Consults: Cardiology  Family Communication: Greater than 50% of time was spent discussing patient's condition and plan of care with him and his wife at the bedside.  All questions and concerns have been addressed. They verbalized understanding and agree with the plan.  CODE STATUS was discussed and he wishes to be DNR  Severity of Illness: The appropriate patient status for this patient is INPATIENT. Inpatient status is judged to be reasonable and necessary in order to provide the required intensity of service to ensure the patient's safety. The patient's presenting symptoms, physical exam findings, and initial radiographic and laboratory data in the context of their chronic comorbidities is felt to place them at high risk for further clinical deterioration. Furthermore, it is not anticipated that the patient will be medically stable for discharge from the hospital within 2 midnights of admission.   * I certify that at the point of admission it is my clinical judgment that the patient will require inpatient hospital care spanning beyond 2 midnights from the point of admission due to high intensity of service, high risk for further deterioration and high  frequency of surveillance required.*  Author: Lucile Shutters, MD 01/04/2022 10:50 AM  For on call review [email or web address].

## 2022-01-04 NOTE — ED Triage Notes (Signed)
Pt arrived with complaint of pacemaker firing. Pt states 2 night ago he was woken up by a shock but did not get it checked out. Pt was in the bathroom this morning and was shocked again. Pt reports palpitations for a few days. Pt endorses SOB but denies CP.   Pt A&Ox4. Pt has 2+ bilat radial pulses. Pt GCS 15.

## 2022-01-04 NOTE — Progress Notes (Signed)
ANTICOAGULATION CONSULT NOTE - Initial Consult  Pharmacy Consult for Heparin  Indication: chest pain/ACS  Allergies  Allergen Reactions   Lisinopril Swelling    Lip swelling    Patient Measurements: Height: 5\' 11"  (180.3 cm) Weight: 74.8 kg (165 lb) IBW/kg (Calculated) : 75.3 Heparin Dosing Weight: 74.8 kg   Vital Signs: Temp: 98 F (36.7 C) (12/24 0603) Temp Source: Oral (12/24 0603) BP: 116/74 (12/24 0603) Pulse Rate: 69 (12/24 0603)  Labs: No results for input(s): "HGB", "HCT", "PLT", "APTT", "LABPROT", "INR", "HEPARINUNFRC", "HEPRLOWMOCWT", "CREATININE", "CKTOTAL", "CKMB", "TROPONINIHS" in the last 72 hours.  CrCl cannot be calculated (Patient's most recent lab result is older than the maximum 21 days allowed.).   Medical History: Past Medical History:  Diagnosis Date   CHF (congestive heart failure) (HCC)    COPD (chronic obstructive pulmonary disease) (HCC)    Coronary artery disease    MI (myocardial infarction) (HCC)     Medications:  (Not in a hospital admission)   Assessment: Pharmacy consulted to dose heparin in this 71 year old male admitted with ACS/NSTEMI.  No prior anticoag noted.  CrCl = ?   Goal of Therapy:  Heparin level 0.3-0.7 units/ml Monitor platelets by anticoagulation protocol: Yes   Plan:  Give 4000 units bolus x 1 Start heparin infusion at 900 units/hr Check anti-Xa level in 6 hours and daily while on heparin Continue to monitor H&H and platelets  Aerionna Moravek D 01/04/2022,6:57 AM

## 2022-01-04 NOTE — Assessment & Plan Note (Signed)
Stable and not acutely exacerbated Continue as needed bronchodilator therapy as well as inhaled steroids 

## 2022-01-04 NOTE — Assessment & Plan Note (Signed)
Treatment as outlined in 1 

## 2022-01-04 NOTE — ED Notes (Signed)
Lab called for blood work

## 2022-01-04 NOTE — ED Notes (Signed)
Pacemaker interrogated at this time.

## 2022-01-04 NOTE — Assessment & Plan Note (Signed)
Patient with known triple-vessel disease who presents to the ER for evaluation of worsening exertional shortness of breath, palpitations and dizziness. Will obtain serial troponin levels Start patient on a heparin drip per cardiology recommendation Continue aspirin, statins, beta-blocker

## 2022-01-04 NOTE — ED Notes (Signed)
Report given to Elaina, RN 

## 2022-01-04 NOTE — ED Notes (Signed)
[employer] phone call with results from interrogation. States she will fax over results as well.   Per report patient had an episode of ventricular tachycardia yesterday morning (01/03/22) and received 1 shock and 3 rounds of pacing.   Per report patient had an episode of ventricular fibrillation this morning (01/04/22) and received 1 shock and 1 round of pacing.   MD Katrinka Blazing notified.

## 2022-01-04 NOTE — ED Provider Notes (Signed)
[facility] Provider Note    Event Date/Time   First MD Initiated Contact with Patient 01/04/22 0559     (approximate)   History   Pacemaker Problem   HPI  Peter Frey is a 71 y.o. male who presents to the ED for evaluation of Pacemaker Problem   I attempted to review some [facility] documentation through care everywhere on 12/1.  Known ischemic heart disease with three-vessel CABG in 2015, ICD in place due to subsequent ischemic cardiomyopathy.  Medtronic defibrillator.  Patient presents to the ED after his defibrillator has gone off twice in the past couple nights.  He reports 1 episode that occurred while he was sleeping 2 nights ago that woke him from sleep, he called EMS to get checked out but refused transport to the hospital because he felt fine.  Second episode was earlier this morning around 430 or 5 AM as he was just waking up and getting ready, he was in the bathroom getting ready when he suddenly felt his defibrillator again.  Furthermore, in a subacute fashion over the past few weeks patient reports exertional chest pressure, dyspnea and weakness improved with rest.  Patient took 324 of aspirin prior to arrival  Physical Exam   Triage Vital Signs: ED Triage Vitals [01/04/22 0557]  Enc Vitals Group     BP      Pulse      Resp      Temp      Temp src      SpO2      Weight 165 lb (74.8 kg)     Height 5\' 11"  (1.803 m)     Head Circumference      Peak Flow      Pain Score 0     Pain Loc      Pain Edu?      Excl. in GC?     Most recent vital signs: Vitals:   01/04/22 0603  BP: 116/74  Pulse: 69  Resp: 16  Temp: 98 F (36.7 C)  SpO2: 99%    General: Awake, no distress.  Sitting upright in bed and looks well.  Pleasant and conversational. CV:  Good peripheral perfusion.  Frequent PVCs on the monitor. Resp:  Normal effort.  Abd:  No distention.  MSK:  No deformity noted.  Neuro:  No focal deficits appreciated. Other:     ED  Results / Procedures / Treatments   Labs (all labs ordered are listed, but only abnormal results are displayed) Labs Reviewed  RESP PANEL BY RT-PCR (RSV, FLU A&B, COVID)  [id number]  COMPREHENSIVE METABOLIC PANEL  CBC WITH DIFFERENTIAL/PLATELET  BRAIN NATRIURETIC PEPTIDE  URINALYSIS, ROUTINE W REFLEX MICROSCOPIC  MAGNESIUM  PROTIME-INR  APTT  TROPONIN I (HIGH SENSITIVITY)    EKG Sinus rhythm with a rate of 74 bpm.  Frequent PVCs.  Seen in the normal axis, intervals and no clear signs of acute ischemia.  RADIOLOGY CXR interpreted by me without evidence of acute cardiopulmonary pathology.  Official radiology report(s): DG Chest Portable 1 View  Result Date: 01/04/2022 CLINICAL DATA:  Defibrillator firing EXAM: PORTABLE CHEST 1 VIEW COMPARISON:  10/18/2018 FINDINGS: Single chamber defibrillator lead into the right ventricle, unchanged. Mild cardiomegaly, CABG and coronary stenting. There is no edema, consolidation, effusion, or pneumothorax. IMPRESSION: No evidence of active disease. Electronically Signed   By: 12/18/2018 M.D.   On: 01/04/2022 06:27    PROCEDURES and INTERVENTIONS:  .1-3 Lead EKG Interpretation  Performed  by: Delton Prairie, MD Authorized by: Delton Prairie, MD     Interpretation: normal     ECG rate:  70   ECG rate assessment: normal     Rhythm: sinus rhythm     Ectopy: PVCs     Conduction: normal     Medications - No data to display   IMPRESSION / MDM / ASSESSMENT AND PLAN / ED COURSE  I reviewed the triage vital signs and the nursing notes.  Differential diagnosis includes, but is not limited to, ACS, dysrhythmia, heart renal function  {Patient presents with symptoms of an acute illness or injury that is potentially life-threatening.  Pleasant 71 year old male with extensive cardiac history presents after his defibrillator has fired twice in the past 2 nights.  His preceding symptoms are concerning for worsening stable angina.  He is asymptomatic here  in the ED and looks well without signs of any V. tach or sustained V. tach, which he has had in the past.  We will keep him on the monitor, send for blood work and anticipate he will be requiring medical admission.  He is reluctantly agreeable  His Medtronic defibrillator was interrogated and we are awaiting this report.  Clinical Course as of 01/04/22 1771  Wynelle Link Jan 04, 2022  0650 I am informed of preliminary results from his Medtronic interrogation.  V. fib yesterday and V-tach this morning, they are faxing over the results now.  Considering his preceding anginal symptoms as well as he is confirmed ventricular dysrhythmias, we will initiate heparinization [DS]  0728 I reach out to unassigned cardiology and talk with Dr. Azucena Cecil who defers to [facility] clinic cardiology at this patient was evaluated by them 1.5 years ago so I paged the [facility] group [DS]  0739 I consult with Dr. Welton Flakes covering [place] cards.  He recommends provide 2 g of magnesium and initiating amiodarone bolus and drip as well.  Requests a secure chat message with the patient information [DS]    Clinical Course User Index [DS] Delton Prairie, MD     FINAL CLINICAL IMPRESSION(S) / ED DIAGNOSES   Final diagnoses:  Defibrillator discharge  Angina pectoris [facility])     Rx / DC Orders   ED Discharge Orders     None        Note:  This document was prepared using Dragon voice recognition software and may include unintentional dictation errors.   Delton Prairie, MD 01/04/22 [phone]

## 2022-01-04 NOTE — Progress Notes (Signed)
ANTICOAGULATION CONSULT NOTE - Initial Consult  Pharmacy Consult for Heparin  Indication: chest pain/ACS  Allergies  Allergen Reactions   Lisinopril Swelling    Lip swelling  Other Reaction(s): Angioedema    Patient Measurements: Height: 5\' 11"  (180.3 cm) Weight: 74.8 kg (165 lb) IBW/kg (Calculated) : 75.3 Heparin Dosing Weight: 74.8 kg   Vital Signs: Temp: 98 F (36.7 C) (12/24 2000) Temp Source: Oral (12/24 1503) BP: 149/85 (12/24 2300) Pulse Rate: 67 (12/24 2245)  Labs: Recent Labs    01/04/22 01/06/22 01/04/22 0914 01/04/22 1226 01/04/22 1327 01/04/22 2230  HGB 14.1  --   --   --   --   HCT 45.6  --   --   --   --   PLT 138*  --   --   --   --   APTT 27  --   --   --   --   LABPROT 12.9  --   --   --   --   INR 1.0  --   --   --   --   HEPARINUNFRC  --   --   --  0.97* 0.85*  CREATININE 1.14  --   --   --   --   TROPONINIHS 26* 38* 28* 27*  --     Estimated Creatinine Clearance: 63.8 mL/min (by C-G formula based on SCr of 1.14 mg/dL).   Medical History: Past Medical History:  Diagnosis Date   CHF (congestive heart failure) (HCC)    COPD (chronic obstructive pulmonary disease) (HCC)    Coronary artery disease    MI (myocardial infarction) (HCC)     Medications:  (Not in a hospital admission)  Assessment: Pharmacy consulted to dose heparin in this 71 year old male admitted with ACS/NSTEMI.  No prior anticoag noted.  CrCl = ?   Goal of Therapy:  Heparin level 0.3-0.7 units/ml Monitor platelets by anticoagulation protocol: Yes   Plan:  12/24:  HL @ 2230 = 0.85, elevated Will decrease heparin drip to 600 units/hr and recheck HL 6 hrs after rate change.   Zerline Melchior D 01/04/2022,11:54 PM

## 2022-01-05 DIAGNOSIS — I472 Ventricular tachycardia, unspecified: Secondary | ICD-10-CM

## 2022-01-05 DIAGNOSIS — F17200 Nicotine dependence, unspecified, uncomplicated: Secondary | ICD-10-CM | POA: Diagnosis present

## 2022-01-05 LAB — BASIC METABOLIC PANEL
Anion gap: 8 (ref 5–15)
BUN: 19 mg/dL (ref 8–23)
CO2: 24 mmol/L (ref 22–32)
Calcium: 8.6 mg/dL — ABNORMAL LOW (ref 8.9–10.3)
Chloride: 107 mmol/L (ref 98–111)
Creatinine, Ser: 1.3 mg/dL — ABNORMAL HIGH (ref 0.61–1.24)
GFR, Estimated: 59 mL/min — ABNORMAL LOW (ref >60)
Glucose, Bld: 115 mg/dL — ABNORMAL HIGH (ref 70–99)
Potassium: 3.6 mmol/L (ref 3.5–5.1)
Sodium: 139 mmol/L (ref 135–145)

## 2022-01-05 LAB — CBC
HCT: 44.1 % (ref 39.0–52.0)
Hemoglobin: 14.4 g/dL (ref 13.0–17.0)
MCH: 24.3 pg — ABNORMAL LOW (ref 26.0–34.0)
MCHC: 32.7 g/dL (ref 30.0–36.0)
MCV: 74.5 fL — ABNORMAL LOW (ref 80.0–100.0)
Platelets: 144 10*3/uL — ABNORMAL LOW (ref 150–400)
RBC: 5.92 MIL/uL — ABNORMAL HIGH (ref 4.22–5.81)
RDW: 15.6 % — ABNORMAL HIGH (ref 11.5–15.5)
WBC: 5.5 10*3/uL (ref 4.0–10.5)
nRBC: 0 % (ref 0.0–0.2)

## 2022-01-05 LAB — HEPARIN LEVEL (UNFRACTIONATED): Heparin Unfractionated: 0.24 IU/mL — ABNORMAL LOW (ref 0.30–0.70)

## 2022-01-05 MED ORDER — AMIODARONE HCL 400 MG PO TABS: ORAL_TABLET | ORAL | 0 refills | Status: AC

## 2022-01-05 MED ORDER — AMIODARONE HCL 200 MG PO TABS
400.0000 mg | ORAL_TABLET | Freq: Two times a day (BID) | ORAL | Status: DC
  Administered 2022-01-05: 400 mg via ORAL
  Filled 2022-01-05: qty 2

## 2022-01-05 NOTE — ED Notes (Signed)
Assumed care from Arissa,RN. Pt resting comfortably in bed at this time. Pt denies any current needs or questions. Call light with in reach.   

## 2022-01-05 NOTE — Progress Notes (Signed)
ANTICOAGULATION CONSULT NOTE - Initial Consult  Pharmacy Consult for Heparin  Indication: chest pain/ACS  Allergies  Allergen Reactions   Lisinopril Swelling    Lip swelling  Other Reaction(s): Angioedema    Patient Measurements: Height: 5\' 11"  (180.3 cm) Weight: 74.8 kg (165 lb) IBW/kg (Calculated) : 75.3 Heparin Dosing Weight: 74.8 kg   Vital Signs: Temp: 97.2 F (36.2 C) (12/25 0516) Temp Source: Oral (12/25 0516) BP: 94/58 (12/25 0530) Pulse Rate: 62 (12/25 0530)  Labs: Recent Labs    01/04/22 01/06/22 01/04/22 0914 01/04/22 1226 01/04/22 1327 01/04/22 2230 01/05/22 0520 01/05/22 0600  HGB 14.1  --   --   --   --  14.4  --   HCT 45.6  --   --   --   --  44.1  --   PLT 138*  --   --   --   --  144*  --   APTT 27  --   --   --   --   --   --   LABPROT 12.9  --   --   --   --   --   --   INR 1.0  --   --   --   --   --   --   HEPARINUNFRC  --   --   --  0.97* 0.85*  --  0.24*  CREATININE 1.14  --   --   --   --   --  1.30*  TROPONINIHS 26* 38* 28* 27*  --   --   --      Estimated Creatinine Clearance: 55.9 mL/min (A) (by C-G formula based on SCr of 1.3 mg/dL (H)).   Medical History: Past Medical History:  Diagnosis Date   CHF (congestive heart failure) (HCC)    COPD (chronic obstructive pulmonary disease) (HCC)    Coronary artery disease    MI (myocardial infarction) (HCC)     Medications:  (Not in a hospital admission)  Assessment: Pharmacy consulted to dose heparin in this 71 year old male admitted with ACS/NSTEMI.  No prior anticoag noted.  CrCl = ?   Goal of Therapy:  Heparin level 0.3-0.7 units/ml Monitor platelets by anticoagulation protocol: Yes   Plan:  12/24:  HL @ 2230 = 0.85, elevated Will decrease heparin drip to 600 units/hr and recheck HL 6 hrs after rate change.   12/25:  HL @ 0600 = 0.24, SUBtherapeutic  Will increase drip rate to 700 units/hr and recheck HL 6 hrs after rate change.   Kaipo Ardis D 01/05/2022,6:53 AM

## 2022-01-05 NOTE — ED Notes (Signed)
Report given to Alexys, RN  

## 2022-01-05 NOTE — ED Notes (Signed)
Pt discharge to home. Pt VSS, GCS 15, NAD. Pt verbalized understanding of discharge instructions with no additional questions at this time.  

## 2022-01-05 NOTE — Progress Notes (Signed)
SUBJECTIVE: No chest pain or shortness of breath   Vitals:   01/05/22 0900 01/05/22 0930 01/05/22 1000 01/05/22 1030  BP: 115/75 107/76 102/60 106/64  Pulse: 64 61 62 (!) 32  Resp: 15 (!) 24 16 16   Temp: (!) 97.4 F (36.3 C)     TempSrc:      SpO2: 98%  98%   Weight:      Height:        Intake/Output Summary (Last 24 hours) at 01/05/2022 1124 Last data filed at 01/05/2022 0610 Gross per 24 hour  Intake 535.56 ml  Output 1360 ml  Net -824.44 ml    LABS: Basic Metabolic Panel: Recent Labs    01/04/22 0633 01/05/22 0600  NA 141 139  K 4.1 3.6  CL 110 107  CO2 23 24  GLUCOSE 92 115*  BUN 19 19  CREATININE 1.14 1.30*  CALCIUM 8.8* 8.6*  MG 2.3  --    Liver Function Tests: Recent Labs    01/04/22 0633  AST 20  ALT 12  ALKPHOS 31*  BILITOT 1.0  PROT 7.4  ALBUMIN 4.0   No results for input(s): "LIPASE", "AMYLASE" in the last 72 hours. CBC: Recent Labs    01/04/22 0633 01/05/22 0520  WBC 4.5 5.5  NEUTROABS 1.8  --   HGB 14.1 14.4  HCT 45.6 44.1  MCV 76.3* 74.5*  PLT 138* 144*   Cardiac Enzymes: No results for input(s): "CKTOTAL", "CKMB", "CKMBINDEX", "TROPONINI" in the last 72 hours. BNP: Invalid input(s): "POCBNP" D-Dimer: No results for input(s): "DDIMER" in the last 72 hours. Hemoglobin A1C: No results for input(s): "HGBA1C" in the last 72 hours. Fasting Lipid Panel: No results for input(s): "CHOL", "HDL", "LDLCALC", "TRIG", "CHOLHDL", "LDLDIRECT" in the last 72 hours. Thyroid Function Tests: Recent Labs    01/04/22 0613  TSH 1.740   Anemia Panel: No results for input(s): "VITAMINB12", "FOLATE", "FERRITIN", "TIBC", "IRON", "RETICCTPCT" in the last 72 hours.   PHYSICAL EXAM General: Well developed, well nourished, in no acute distress HEENT:  Normocephalic and atramatic Neck:  No JVD.  Lungs: Clear bilaterally to auscultation and percussion. Heart: HRRR . Normal S1 and S2 without gallops or murmurs.  Abdomen: Bowel sounds are  positive, abdomen soft and non-tender  Msk:  Back normal, normal gait. Normal strength and tone for age. Extremities: No clubbing, cyanosis or edema.   Neuro: Alert and oriented X 3. Psych:  Good affect, responds appropriately  TELEMETRY: Sinus rhythm  ASSESSMENT AND PLAN: Status post V. tach and V-fib and discharge of ICD twice in 1 week.  Patient received IV amiodarone overnight and is feeling much better denies any chest pain shortness of breath and has no significant elevation of troponins.  Patient can be discharged on p.o. amiodarone 400 twice daily for 1 week and then 400 once a day.  Patient will be seen next week Tuesday probably on 2 January at around 10 AM.  He will be seen at [facility] medical associate in [place] phone [phone].  Principal Problem:   Defibrillator discharge Active Problems:   Ventricular tachycardia (HCC)   Chronic combined systolic (congestive) and diastolic (congestive) heart failure (HCC)   Chronic obstructive pulmonary disease, unspecified (HCC)   CAD (coronary artery disease)    Peter Frey A, MD, [facility] 01/05/2022 11:24 AM

## 2022-01-05 NOTE — Progress Notes (Incomplete)
{  Select_TRH_Note:26780} 

## 2022-01-05 NOTE — Discharge Summary (Signed)
Physician Discharge Summary   Patient: Peter Frey MRN: [id number] DOB: [birth year]  Admit date:     01/04/2022  Discharge date: {dischdate:26783}  Discharge Physician: Lurene Shadow   [facility]P: [facility]   Recommendations at discharge:  {Tip this will not be part of the note when signed- Example include specific recommendations for outpatient follow-up, pending tests to follow-up on. (Optional):26781}  ***  Discharge Diagnoses: Principal Problem:   Defibrillator discharge Active Problems:   Ventricular tachycardia (HCC)   Chronic combined systolic (congestive) and diastolic (congestive) heart failure (HCC)   Chronic obstructive pulmonary disease, unspecified (HCC)   CAD (coronary artery disease)   Tobacco use disorder  Resolved Problems:   * No resolved hospital problems. [facility] Course: No notes on file   Discharge plan discussed with the patient and his wife at the bedside.  Assessment and Plan: * Defibrillator discharge Patient with a history of ischemic cardiomyopathy with last known LVEF of 35% from a 2D echocardiogram which was done 07/23 who presents for evaluation of 2 episodes of defibrillator discharge. AICD interrogation was done and showed 1 episode of V-fib as well as an episode of nonsustained V. Tach Cardiology was consulted and recommends to place patient on amiodarone drip as well as a heparin drip Place patient on cardiac monitoring Check electrolytes  CAD (coronary artery disease) Patient with known triple-vessel disease who presents to the ER for evaluation of worsening exertional shortness of breath, palpitations and dizziness. Will obtain serial troponin levels Start patient on a heparin drip per cardiology recommendation Continue aspirin, statins, beta-blocker  Chronic obstructive pulmonary disease, unspecified (HCC) Stable and not acutely exacerbated Continue as needed bronchodilator therapy as well as inhaled  steroids  Chronic combined systolic (congestive) and diastolic (congestive) heart failure (HCC) Stable and not acutely exacerbated Last known LVEF of 35% from a 2D echocardiogram which was done 07/23 Continue furosemide, spironolactone, metoprolol and losartan  Ventricular tachycardia (HCC) Treatment as outlined in 1      {Tip this will not be part of the note when signed Body mass index is 23.01 kg/m. , ,  (Optional):26781}  {(NOTE) Pain control PDMP Statment (Optional):26782} Consultants: *** Procedures performed: ***  Disposition: {Plan; Disposition:26390} Diet recommendation:  Discharge Diet Orders (From admission, onward)     Start     Ordered   01/05/22 0000  Diet - low sodium heart healthy        01/05/22 1144           {Diet_Plan:26776} DISCHARGE MEDICATION: Allergies as of 01/05/2022       Reactions   Lisinopril Swelling   Lip swelling Other Reaction(s): Angioedema        Medication List     TAKE these medications    acetaminophen 500 MG tablet Commonly known as: TYLENOL Take 500 mg by mouth every 6 (six) hours as needed.   albuterol 108 (90 Base) MCG/ACT inhaler Commonly known as: VENTOLIN HFA Inhale 1 puff into the lungs every 4 (four) hours as needed for wheezing or shortness of breath.   amiodarone 400 MG tablet Commonly known as: PACERONE Take 1 tablet (400 mg total) by mouth 2 (two) times daily for 7 days, THEN 1 tablet (400 mg total) daily for 21 days. Start taking on: January 05, 2022   aspirin 81 MG tablet Take 81 mg by mouth daily.   atorvastatin 80 MG tablet Commonly known as: LIPITOR TAKE ONE-HALF TABLET BY MOUTH AT BEDTIME FOR HEART AND CHOLESTEROL.  ezetimibe 10 MG tablet Commonly known as: ZETIA Take 1 tablet by mouth daily.   furosemide 40 MG tablet Commonly known as: LASIX Take 40 mg by mouth every other day.   gabapentin 300 MG capsule Commonly known as: NEURONTIN Take 300 mg by mouth 3 (three) times daily as  needed.   losartan 25 MG tablet Commonly known as: COZAAR Take 25 mg by mouth daily.   metoprolol succinate 100 MG 24 hr tablet Commonly known as: TOPROL-XL TAKE ONE-HALF TABLET BY MOUTH EVERY DAY FOR HEART AND BLOOD PRESSURE.   sildenafil 50 MG tablet Commonly known as: VIAGRA Take by mouth as needed.   spironolactone 25 MG tablet Commonly known as: ALDACTONE Take 12.5 mg by mouth daily.   TIOTROPIUM BROMIDE-OLODATEROL IN Inhale 2 Inhalers into the lungs daily.        Follow-up Information     Laurier Nancy, MD Follow up on 01/13/2022.   Specialty: Cardiology Why: at 10 am Phone: [phone]. Contact information: [address] [place] [address] [phone]                Discharge Exam: Ceasar Mons Weights   01/04/22 0557 01/04/22 1503  Weight: 74.8 kg 74.8 kg   ***  Condition at discharge: {DC Condition:26389}  The results of significant diagnostics from this hospitalization (including imaging, microbiology, ancillary and laboratory) are listed below for reference.   Imaging Studies: DG Chest Portable 1 View  Result Date: 01/04/2022 CLINICAL DATA:  Defibrillator firing EXAM: PORTABLE CHEST 1 VIEW COMPARISON:  10/18/2018 FINDINGS: Single chamber defibrillator lead into the right ventricle, unchanged. Mild cardiomegaly, CABG and coronary stenting. There is no edema, consolidation, effusion, or pneumothorax. IMPRESSION: No evidence of active disease. Electronically Signed   By: Tiburcio Pea M.D.   On: 01/04/2022 06:27    Microbiology: Results for orders placed or performed during the hospital encounter of 01/04/22  Resp panel by RT-PCR (RSV, Flu A&B, Covid) Anterior Nasal Swab     Status: None   Collection Time: 01/04/22  6:35 AM   Specimen: Anterior Nasal Swab  Result Value Ref Range Status   SARS Coronavirus 2 by RT PCR NEGATIVE NEGATIVE Final    Comment: (NOTE) SARS-CoV-2 target nucleic acids are NOT DETECTED.  The SARS-CoV-2 RNA is  generally detectable in upper respiratory specimens during the acute phase of infection. The lowest concentration of SARS-CoV-2 viral copies this assay can detect is 138 copies/mL. A negative result does not preclude SARS-Cov-2 infection and should not be used as the sole basis for treatment or other patient management decisions. A negative result may occur with  improper specimen collection/handling, submission of specimen other than nasopharyngeal swab, presence of viral mutation(s) within the areas targeted by this assay, and inadequate number of viral copies(<138 copies/mL). A negative result must be combined with clinical observations, patient history, and epidemiological information. The expected result is Negative.  Fact Sheet for Patients:  BloggerCourse.com  Fact Sheet for Healthcare Providers:  SeriousBroker.it  This test is no t yet approved or cleared by the [place] FDA and  has been authorized for detection and/or diagnosis of SARS-CoV-2 by FDA under an Emergency Use Authorization (EUA). This EUA will remain  in effect (meaning this test can be used) for the duration of the COVID-19 declaration under Section 564(b)(1) of the Act, 21 U.S.C.section 360bbb-3(b)(1), unless the authorization is terminated  or revoked sooner.       Influenza A by PCR NEGATIVE NEGATIVE Final   Influenza B by  PCR NEGATIVE NEGATIVE Final    Comment: (NOTE) The Xpert Xpress SARS-CoV-2/FLU/RSV plus assay is intended as an aid in the diagnosis of influenza from Nasopharyngeal swab specimens and should not be used as a sole basis for treatment. Nasal washings and aspirates are unacceptable for Xpert Xpress SARS-CoV-2/FLU/RSV testing.  Fact Sheet for Patients: BloggerCourse.com  Fact Sheet for Healthcare Providers: SeriousBroker.it  This test is not yet approved or cleared by the [place] FDA and has been authorized for detection and/or diagnosis of SARS-CoV-2 by FDA under an Emergency Use Authorization (EUA). This EUA will remain in effect (meaning this test can be used) for the duration of the COVID-19 declaration under Section 564(b)(1) of the Act, 21 U.S.C. section 360bbb-3(b)(1), unless the authorization is terminated or revoked.     Resp Syncytial Virus by PCR NEGATIVE NEGATIVE Final    Comment: (NOTE) Fact Sheet for Patients: BloggerCourse.com  Fact Sheet for Healthcare Providers: SeriousBroker.it  This test is not yet approved or cleared by the [place] FDA and has been authorized for detection and/or diagnosis of SARS-CoV-2 by FDA under an Emergency Use Authorization (EUA). This EUA will remain in effect (meaning this test can be used) for the duration of the COVID-19 declaration under Section 564(b)(1) of the Act, 21 U.S.C. section 360bbb-3(b)(1), unless the authorization is terminated or revoked.  Performed at [facility], [address] [place], [place] [address]     Labs: CBC: Recent Labs  Lab 01/04/22 [phone] 01/05/22 0520  WBC 4.5 5.5  NEUTROABS 1.8  --   HGB 14.1 14.4  HCT 45.6 44.1  MCV 76.3* 74.5*  PLT 138* 144*   Basic Metabolic Panel: Recent Labs  Lab 01/04/22 0633 01/05/22 0600  NA 141 139  K 4.1 3.6  CL 110 107  CO2 23 24  GLUCOSE 92 115*  BUN 19 19  CREATININE 1.14 1.30*  CALCIUM 8.8* 8.6*  MG 2.3  --    Liver Function Tests: Recent Labs  Lab 01/04/22 0633  AST 20  ALT 12  ALKPHOS 31*  BILITOT 1.0  PROT 7.4  ALBUMIN 4.0   CBG: No results for input(s): "GLUCAP" in the last 168 hours.  Discharge time spent: {LESS THAN/GREATER POLI:10301} 30 minutes.  Signed: Lurene Shadow, MD [facility] 01/05/2022

## 2022-01-05 NOTE — ED Notes (Addendum)
Patient with frequent PVCS. Patient with small run of Vtach on monitor and runs of Bigeminy. CP, SOB denied. EKG obtained and Bishop Limbo, NP notified. No new orders received.

## 2022-04-03 ENCOUNTER — Encounter: Payer: Self-pay | Admitting: *Deleted

## 2022-08-24 ENCOUNTER — Observation Stay
Admission: EM | Admit: 2022-08-24 | Discharge: 2022-08-25 | Disposition: A | Discharge status: Home / Self Care | Payer: No Typology Code available for payment source | Attending: Internal Medicine | Admitting: Internal Medicine

## 2022-08-24 ENCOUNTER — Other Ambulatory Visit: Payer: Self-pay

## 2022-08-24 ENCOUNTER — Encounter: Payer: Self-pay | Admitting: Family Medicine

## 2022-08-24 ENCOUNTER — Inpatient Hospital Stay: Payer: No Typology Code available for payment source

## 2022-08-24 DIAGNOSIS — Z95810 Presence of automatic (implantable) cardiac defibrillator: Secondary | ICD-10-CM | POA: Diagnosis not present

## 2022-08-24 DIAGNOSIS — Z7982 Long term (current) use of aspirin: Secondary | ICD-10-CM | POA: Insufficient documentation

## 2022-08-24 DIAGNOSIS — U071 COVID-19: Principal | ICD-10-CM | POA: Diagnosis present

## 2022-08-24 DIAGNOSIS — Z79899 Other long term (current) drug therapy: Secondary | ICD-10-CM | POA: Insufficient documentation

## 2022-08-24 DIAGNOSIS — R7989 Other specified abnormal findings of blood chemistry: Secondary | ICD-10-CM

## 2022-08-24 DIAGNOSIS — N189 Chronic kidney disease, unspecified: Secondary | ICD-10-CM

## 2022-08-24 DIAGNOSIS — R7401 Elevation of levels of liver transaminase levels: Secondary | ICD-10-CM | POA: Diagnosis not present

## 2022-08-24 DIAGNOSIS — N186 End stage renal disease: Secondary | ICD-10-CM | POA: Insufficient documentation

## 2022-08-24 DIAGNOSIS — I2510 Atherosclerotic heart disease of native coronary artery without angina pectoris: Secondary | ICD-10-CM | POA: Diagnosis not present

## 2022-08-24 DIAGNOSIS — J449 Chronic obstructive pulmonary disease, unspecified: Secondary | ICD-10-CM

## 2022-08-24 DIAGNOSIS — N179 Acute kidney failure, unspecified: Secondary | ICD-10-CM

## 2022-08-24 DIAGNOSIS — I5042 Chronic combined systolic (congestive) and diastolic (congestive) heart failure: Secondary | ICD-10-CM | POA: Diagnosis not present

## 2022-08-24 DIAGNOSIS — R1111 Vomiting without nausea: Secondary | ICD-10-CM | POA: Diagnosis not present

## 2022-08-24 DIAGNOSIS — Z87891 Personal history of nicotine dependence: Secondary | ICD-10-CM | POA: Diagnosis not present

## 2022-08-24 DIAGNOSIS — I5020 Unspecified systolic (congestive) heart failure: Secondary | ICD-10-CM

## 2022-08-24 DIAGNOSIS — R531 Weakness: Secondary | ICD-10-CM | POA: Diagnosis present

## 2022-08-24 LAB — COMPREHENSIVE METABOLIC PANEL
ALT: 676 U/L — ABNORMAL HIGH (ref 0–44)
ALT: 549 U/L — ABNORMAL HIGH (ref 0–44)
AST: 548 U/L — ABNORMAL HIGH (ref 15–41)
AST: 358 U/L — ABNORMAL HIGH (ref 15–41)
Albumin: 3.1 g/dL — ABNORMAL LOW (ref 3.5–5.0)
Albumin: 3.1 g/dL — ABNORMAL LOW (ref 3.5–5.0)
Alkaline Phosphatase: 25 U/L — ABNORMAL LOW (ref 38–126)
Alkaline Phosphatase: 33 U/L — ABNORMAL LOW (ref 38–126)
Anion gap: 8 (ref 5–15)
Anion gap: 8 (ref 5–15)
BUN: 29 mg/dL — ABNORMAL HIGH (ref 8–23)
BUN: 26 mg/dL — ABNORMAL HIGH (ref 8–23)
CO2: 24 mmol/L (ref 22–32)
CO2: 24 mmol/L (ref 22–32)
Calcium: 7.7 mg/dL — ABNORMAL LOW (ref 8.9–10.3)
Calcium: 7.9 mg/dL — ABNORMAL LOW (ref 8.9–10.3)
Chloride: 104 mmol/L (ref 98–111)
Chloride: 104 mmol/L (ref 98–111)
Creatinine, Ser: 2.14 mg/dL — ABNORMAL HIGH (ref 0.61–1.24)
Creatinine, Ser: 1.62 mg/dL — ABNORMAL HIGH (ref 0.61–1.24)
GFR, Estimated: 32 mL/min — ABNORMAL LOW (ref >60)
GFR, Estimated: 45 mL/min — ABNORMAL LOW (ref >60)
Glucose, Bld: 118 mg/dL — ABNORMAL HIGH (ref 70–99)
Glucose, Bld: 100 mg/dL — ABNORMAL HIGH (ref 70–99)
Potassium: 4.3 mmol/L (ref 3.5–5.1)
Potassium: 4.6 mmol/L (ref 3.5–5.1)
Sodium: 136 mmol/L (ref 135–145)
Sodium: 136 mmol/L (ref 135–145)
Total Bilirubin: 1.1 mg/dL (ref 0.3–1.2)
Total Bilirubin: 1.2 mg/dL (ref 0.3–1.2)
Total Protein: 6.3 g/dL — ABNORMAL LOW (ref 6.5–8.1)
Total Protein: 6.5 g/dL (ref 6.5–8.1)

## 2022-08-24 LAB — CBC WITH DIFFERENTIAL/PLATELET
Abs Immature Granulocytes: 0.01 10*3/uL (ref 0.00–0.07)
Basophils Absolute: 0 10*3/uL (ref 0.0–0.1)
Basophils Relative: 0 %
Eosinophils Absolute: 0 10*3/uL (ref 0.0–0.5)
Eosinophils Relative: 0 %
HCT: 42.8 % (ref 39.0–52.0)
Hemoglobin: 13.2 g/dL (ref 13.0–17.0)
Immature Granulocytes: 0 %
Lymphocytes Relative: 18 %
Lymphs Abs: 0.9 10*3/uL (ref 0.7–4.0)
MCH: 24.2 pg — ABNORMAL LOW (ref 26.0–34.0)
MCHC: 30.8 g/dL (ref 30.0–36.0)
MCV: 78.4 fL — ABNORMAL LOW (ref 80.0–100.0)
Monocytes Absolute: 0.5 10*3/uL (ref 0.1–1.0)
Monocytes Relative: 9 %
Neutro Abs: 3.7 10*3/uL (ref 1.7–7.7)
Neutrophils Relative %: 73 %
Platelets: 117 10*3/uL — ABNORMAL LOW (ref 150–400)
RBC: 5.46 MIL/uL (ref 4.22–5.81)
RDW: 17.2 % — ABNORMAL HIGH (ref 11.5–15.5)
WBC: 5.1 10*3/uL (ref 4.0–10.5)
nRBC: 0 % (ref 0.0–0.2)

## 2022-08-24 LAB — URINALYSIS, W/ REFLEX TO CULTURE (INFECTION SUSPECTED)
Bacteria, UA: NONE SEEN
Bilirubin Urine: NEGATIVE
Glucose, UA: >=500 mg/dL — AB
Hgb urine dipstick: NEGATIVE
Ketones, ur: NEGATIVE mg/dL
Leukocytes,Ua: NEGATIVE
Nitrite: NEGATIVE
Protein, ur: 30 mg/dL — AB
Specific Gravity, Urine: 1.023 (ref 1.005–1.030)
pH: 5 (ref 5.0–8.0)

## 2022-08-24 LAB — HEPATITIS PANEL, ACUTE
HCV Ab: REACTIVE — AB
Hep A IgM: NONREACTIVE
Hep B C IgM: NONREACTIVE
Hepatitis B Surface Ag: NONREACTIVE

## 2022-08-24 LAB — TSH: TSH: 1.41 u[IU]/mL (ref 0.350–4.500)

## 2022-08-24 LAB — SARS CORONAVIRUS 2 BY RT PCR: SARS Coronavirus 2 by RT PCR: POSITIVE — AB

## 2022-08-24 LAB — LACTIC ACID, PLASMA: Lactic Acid, Venous: 1.3 mmol/L (ref 0.5–1.9)

## 2022-08-24 LAB — ACETAMINOPHEN LEVEL: Acetaminophen (Tylenol), Serum: <10 ug/mL — ABNORMAL LOW (ref 10–30)

## 2022-08-24 LAB — LIPASE, BLOOD: Lipase: 57 U/L — ABNORMAL HIGH (ref 11–51)

## 2022-08-24 LAB — ETHANOL: Alcohol, Ethyl (B): <10 mg/dL (ref <10)

## 2022-08-24 LAB — T4, FREE: Free T4: 1.47 ng/dL — ABNORMAL HIGH (ref 0.61–1.12)

## 2022-08-24 MED ORDER — ADULT MULTIVITAMIN W/MINERALS CH
1.0000 | ORAL_TABLET | Freq: Every day | ORAL | Status: DC
  Administered 2022-08-24 – 2022-08-25 (×2): 1 via ORAL
  Filled 2022-08-24 (×2): qty 1

## 2022-08-24 MED ORDER — UMECLIDINIUM BROMIDE 62.5 MCG/ACT IN AEPB
1.0000 | INHALATION_SPRAY | Freq: Every day | RESPIRATORY_TRACT | Status: DC
  Filled 2022-08-24: qty 7

## 2022-08-24 MED ORDER — FOLIC ACID 1 MG PO TABS
1.0000 mg | ORAL_TABLET | Freq: Every day | ORAL | Status: DC
  Administered 2022-08-24 – 2022-08-25 (×2): 1 mg via ORAL
  Filled 2022-08-24 (×2): qty 1

## 2022-08-24 MED ORDER — LEVALBUTEROL TARTRATE 45 MCG/ACT IN AERO
1.0000 | INHALATION_SPRAY | Freq: Two times a day (BID) | RESPIRATORY_TRACT | Status: DC
  Administered 2022-08-25: 1 via RESPIRATORY_TRACT
  Filled 2022-08-24: qty 15

## 2022-08-24 MED ORDER — TIOTROPIUM BROMIDE-OLODATEROL 2.5-2.5 MCG/ACT IN AERS: INHALATION_SPRAY | Freq: Every day | RESPIRATORY_TRACT | Status: DC

## 2022-08-24 MED ORDER — ATORVASTATIN CALCIUM 20 MG PO TABS
80.0000 mg | ORAL_TABLET | Freq: Every day | ORAL | Status: DC
  Administered 2022-08-24 – 2022-08-25 (×2): 80 mg via ORAL
  Filled 2022-08-24 (×2): qty 4

## 2022-08-24 MED ORDER — SODIUM CHLORIDE 0.9 % IV BOLUS
500.0000 mL | Freq: Once | INTRAVENOUS | Status: AC
  Administered 2022-08-24: 500 mL via INTRAVENOUS

## 2022-08-24 MED ORDER — ONDANSETRON HCL 4 MG/2ML IJ SOLN: 4.0000 mg | Freq: Four times a day (QID) | INTRAMUSCULAR | Status: DC | PRN

## 2022-08-24 MED ORDER — NIRMATRELVIR/RITONAVIR (PAXLOVID) TABLET (RENAL DOSING): 2.0000 | ORAL_TABLET | Freq: Two times a day (BID) | ORAL | Status: DC

## 2022-08-24 MED ORDER — ONDANSETRON HCL 4 MG/2ML IJ SOLN
4.0000 mg | INTRAMUSCULAR | Status: AC
  Administered 2022-08-24: 4 mg via INTRAVENOUS
  Filled 2022-08-24: qty 2

## 2022-08-24 MED ORDER — SODIUM CHLORIDE 0.9 % IV SOLN: INTRAVENOUS | Status: DC

## 2022-08-24 MED ORDER — ENOXAPARIN SODIUM 40 MG/0.4ML IJ SOSY
40.0000 mg | PREFILLED_SYRINGE | INTRAMUSCULAR | Status: DC
  Administered 2022-08-24: 40 mg via SUBCUTANEOUS
  Filled 2022-08-24: qty 0.4

## 2022-08-24 MED ORDER — ONDANSETRON HCL 4 MG PO TABS: 4.0000 mg | ORAL_TABLET | Freq: Four times a day (QID) | ORAL | Status: DC | PRN

## 2022-08-24 MED ORDER — THIAMINE MONONITRATE 100 MG PO TABS
100.0000 mg | ORAL_TABLET | Freq: Every day | ORAL | Status: DC
  Administered 2022-08-24 – 2022-08-25 (×2): 100 mg via ORAL
  Filled 2022-08-24 (×2): qty 1

## 2022-08-24 MED ORDER — ARFORMOTEROL TARTRATE 15 MCG/2ML IN NEBU
15.0000 ug | INHALATION_SOLUTION | Freq: Two times a day (BID) | RESPIRATORY_TRACT | Status: DC
  Filled 2022-08-24 (×2): qty 2

## 2022-08-24 MED ORDER — ARFORMOTEROL TARTRATE 15 MCG/2ML IN NEBU: 15.0000 ug | INHALATION_SOLUTION | Freq: Two times a day (BID) | RESPIRATORY_TRACT | Status: DC

## 2022-08-24 MED ORDER — UMECLIDINIUM BROMIDE 62.5 MCG/ACT IN AEPB
1.0000 | INHALATION_SPRAY | Freq: Every day | RESPIRATORY_TRACT | Status: DC
  Filled 2022-08-24 (×2): qty 7

## 2022-08-24 MED ORDER — ASPIRIN 81 MG PO TBEC
81.0000 mg | DELAYED_RELEASE_TABLET | Freq: Every day | ORAL | Status: DC
  Administered 2022-08-24 – 2022-08-25 (×2): 81 mg via ORAL
  Filled 2022-08-24 (×2): qty 1

## 2022-08-24 NOTE — ED Provider Notes (Signed)
[facility] Provider Note    Event Date/Time   First MD Initiated Contact with Patient 08/24/22 [phone]     (approximate)   History   Weakness   HPI  Peter Frey is a 72 y.o. male with a hisotry of CAD, cardiomyopathy s/p AICD, COPD who presents with nausea and vomiting for the last 4 days, persistent course, associated with decreased appetite.  The patient states he has not been able to hold anything down.  He states he vomits every time he tries to eat.  He denies any associated abdominal pain or distention.  He has had no diarrhea and states his last bowel movement was 3 days ago.  He denies any fever or chills.  He has no chest pain or difficulty breathing.  I reviewed the past medical records; the patient was most recently admitted to the hospitalist service in December 2023 after defibrillator discharge and was diagnosed with ventricular fibrillation and nonsustained ventricular tachycardia.  He was treated with amiodarone but this was subsequently discontinued due to thyroiditis.  He was admitted to [facility] in April again after discharges from his defibrillator.  He was restarted on amiodarone at that time.  The patient states that his dose was decreased last week.   Physical Exam   Triage Vital Signs: ED Triage Vitals  Encounter Vitals Group     BP 08/24/22 0625 104/63     Systolic BP Percentile --      Diastolic BP Percentile --      Pulse Rate 08/24/22 0625 (!) 58     Resp 08/24/22 0625 12     Temp 08/24/22 0629 97.9 F (36.6 C)     Temp Source 08/24/22 0629 Oral     SpO2 08/24/22 0625 97 %     Weight 08/24/22 0629 164 lb 14.5 oz (74.8 kg)     Height 08/24/22 0629 5\' 11"  (1.803 m)     Head Circumference --      Peak Flow --      Pain Score 08/24/22 0629 0     Pain Loc --      Pain Education --      Exclude from Growth Chart --     Most recent vital signs: Vitals:   08/24/22 0830 08/24/22 0930  BP: 91/61 97/79  Pulse: (!) 55 (!) 56  Resp:  18 (!) 21  Temp:    SpO2: 95% 95%     General: Awake, relatively well-appearing, no distress.  CV:  Good peripheral perfusion.  Resp:  Normal effort.  Abd:  Soft and nontender.  No distention.  Other:  Dry mucous membranes.   ED Results / Procedures / Treatments   Labs (all labs ordered are listed, but only abnormal results are displayed) Labs Reviewed  SARS CORONAVIRUS 2 BY RT PCR - Abnormal; Notable for the following components:      Result Value   SARS Coronavirus 2 by RT PCR POSITIVE (*)    All other components within normal limits  CBC WITH DIFFERENTIAL/PLATELET - Abnormal; Notable for the following components:   MCV 78.4 (*)    MCH 24.2 (*)    RDW 17.2 (*)    Platelets 117 (*)    All other components within normal limits  COMPREHENSIVE METABOLIC PANEL - Abnormal; Notable for the following components:   Glucose, Bld 118 (*)    BUN 29 (*)    Creatinine, Ser 2.14 (*)    Calcium 7.7 (*)  Total Protein 6.3 (*)    Albumin 3.1 (*)    AST 548 (*)    ALT 676 (*)    Alkaline Phosphatase 25 (*)    GFR, Estimated 32 (*)    All other components within normal limits  LIPASE, BLOOD - Abnormal; Notable for the following components:   Lipase 57 (*)    All other components within normal limits  LACTIC ACID, PLASMA  URINALYSIS, W/ REFLEX TO CULTURE (INFECTION SUSPECTED)  TSH  T4, FREE  T3, FREE     EKG  ED ECG REPORT I, Dionne Bucy, the attending physician, personally viewed and interpreted this ECG.  Date: 08/24/2022 EKG Time: 0640 Rate: 59 Rhythm: normal sinus rhythm QRS Axis: normal Intervals: nonspecific IVCD ST/T Wave abnormalities: normal Narrative Interpretation: no evidence of acute ischemia    RADIOLOGY    PROCEDURES:  Critical Care performed: No  Procedures   MEDICATIONS ORDERED IN ED: Medications  sodium chloride 0.9 % bolus 500 mL (0 mLs Intravenous Stopped 08/24/22 0733)  ondansetron (ZOFRAN) injection 4 mg (4 mg Intravenous  Given 08/24/22 0651)  sodium chloride 0.9 % bolus 500 mL (500 mLs Intravenous New Bag/Given 08/24/22 0736)     IMPRESSION / MDM / ASSESSMENT AND PLAN / ED COURSE  I reviewed the triage vital signs and the nursing notes.  72 year old male with PMH as noted above presents with nausea, vomiting, inability tolerate p.o., and weight loss over the last 4 days with no associated abdominal pain or diarrhea.  On exam his vital signs are normal.  The abdomen is soft nontender.  Mucous membranes are dry.  Differential diagnosis includes, but is not limited to, viral gastroenteritis, other viral syndrome, gastritis, gastroparesis.  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  Given the lack of abdominal pain or tenderness I doubt bowel obstruction or other acute intra-abdominal etiology.  We will give fluids, obtain lab workup, and reassess.  Patient's presentation is most consistent with acute presentation with potential threat to life or bodily function.  The patient is on the cardiac monitor to evaluate for evidence of arrhythmia and/or significant heart rate changes.  ----------------------------------------- 10:01 AM on 08/24/2022 -----------------------------------------  Lab workup reveals that the patient is COVID-positive.  CMP also shows AKI with a creatinine of 2.14.  Lactate is normal.  LFTs are also significant elevated although alkaline phosphatase is not elevated in the bilirubin is normal.  The patient has no abdominal pain, right upper quadrant tenderness, or jaundice.  Overall I suspect that this is likely related to the COVID.  Given these findings the patient will need admission for further workup and management.  I consulted Dr. Alvester Morin from the hospitalist service; based on our discussion he agrees to evaluate the patient for admission.   FINAL CLINICAL IMPRESSION(S) / ED  DIAGNOSES   Final diagnoses:  COVID-19  AKI (acute kidney injury) (HCC)  Elevated LFTs     Rx / DC Orders   ED Discharge Orders     None        Note:  This document was prepared using Dragon voice recognition software and may include unintentional dictation errors.    Dionne Bucy, MD 08/24/22 1002

## 2022-08-24 NOTE — Plan of Care (Signed)
  Problem: Coping: Goal: Psychosocial and spiritual needs will be supported Outcome: Progressing   Problem: Respiratory: Goal: Will maintain a patent airway Outcome: Progressing Goal: Complications related to the disease process, condition or treatment will be avoided or minimized Outcome: Progressing   Problem: Clinical Measurements: Goal: Will remain free from infection Outcome: Progressing   Problem: Activity: Goal: Risk for activity intolerance will decrease Outcome: Progressing

## 2022-08-24 NOTE — H&P (Addendum)
History and Physical    Patient: Peter Frey UEA:[id number] DOB: [birth year] DOA: 08/24/2022 DOS: the patient was seen and examined on 08/24/2022 [facility]: [facility]  Patient coming from: Home  Chief Complaint:  Chief Complaint  Patient presents with   Weakness   HPI: Peter Frey is a 72 y.o. male with medical history significant of COPD, HFrEF, coronary artery disease presenting with COVID, AKI, transaminitis.  Patient and family report patient with worsening fatigue malaise over the past 4 to 5 days.  Positive nausea, vomiting weakness.  Positive sick contact of family with similar symptoms.  Mild rhinorrhea and nasal congestion.  Noted baseline history of HFrEF status post AICD with December 2023 for defibrillator discharge and V. tach.  Patient denies any discharge of AICD no chest pain.  Minimal shortness of breath.  No reported wheezing.  Positive decreased p.o. intake.  Still has been compliant with home medication regimen including daily diuretic. Presented to the ER afebrile, in the 50s, blood pressure 90s to 110s over 60s to 70s.  Satting well on RA.  White count 5, hemoglobin 13, platelets 117, urinalysis stable, creatinine 2.14, AST 548, ALT 676, T. bili 1.1.  Right upper quadrant ultrasound grossly stable. Review of Systems: As mentioned in the history of present illness. All other systems reviewed and are negative. Past Medical History:  Diagnosis Date   CHF (congestive heart failure) (HCC)    COPD (chronic obstructive pulmonary disease) (HCC)    Coronary artery disease    MI (myocardial infarction) (HCC)    Past Surgical History:  Procedure Laterality Date   CARDIAC DEFIBRILLATOR PLACEMENT     CORONARY ARTERY BYPASS GRAFT     CORONARY PRESSURE/FFR STUDY N/A 10/18/2018   Procedure: INTRAVASCULAR PRESSURE WIRE/FFR STUDY;  Surgeon: Yvonne Kendall, MD;  Location: [facility] INVASIVE CV LAB;  Service: Cardiovascular;  Laterality: N/A;  OM   CORONARY/GRAFT ACUTE MI  REVASCULARIZATION N/A 10/18/2018   Procedure: Coronary/Graft Acute MI Revascularization;  Surgeon: Yvonne Kendall, MD;  Location: [facility] INVASIVE CV LAB;  Service: Cardiovascular;  Laterality: N/A;   LEFT HEART CATH AND CORS/GRAFTS ANGIOGRAPHY N/A 10/18/2018   Procedure: LEFT HEART CATH AND CORS/GRAFTS ANGIOGRAPHY;  Surgeon: Yvonne Kendall, MD;  Location: [facility] INVASIVE CV LAB;  Service: Cardiovascular;  Laterality: N/A;   Social History:  reports that he quit smoking about 9 years ago. His smoking use included cigarettes. He started smoking about 52 years ago. He has a 86 pack-year smoking history. He has never used smokeless tobacco. He reports current alcohol use. He reports that he does not currently use drugs after having used the following drugs: Cocaine and Marijuana.  Allergies  Allergen Reactions   Lisinopril Swelling    Lip swelling  Other Reaction(s): Angioedema    Family History  Problem Relation Age of Onset   Aneurysm Mother    Aneurysm Father     Prior to Admission medications   Medication Sig Start Date End Date Taking? Authorizing Provider  acetaminophen (TYLENOL) 500 MG tablet Take 500 mg by mouth every 6 (six) hours as needed.   Yes [provider]  albuterol (VENTOLIN HFA) 108 (90 Base) MCG/ACT inhaler Inhale 1 puff into the lungs every 4 (four) hours as needed for wheezing or shortness of breath.   Yes [provider]  amiodarone (PACERONE) 400 MG tablet Take 1 tablet (400 mg total) by mouth 2 (two) times daily for 7 days, THEN 1 tablet (400 mg total) daily for 21 days. 01/05/22 08/24/22  Yes Lurene Shadow, MD  aspirin 81 MG tablet Take 81 mg by mouth daily.   Yes [provider]  empagliflozin (JARDIANCE) 25 MG TABS tablet Take 12.5 mg by mouth daily. 03/04/22  Yes [provider]  ezetimibe (ZETIA) 10 MG tablet Take 1 tablet by mouth daily. 05/16/20  Yes [provider]  furosemide (LASIX) 40 MG tablet Take 40 mg by mouth  daily as needed for fluid. TAKE EXRTA DOSE IF GAINING MORE THAN 2.5LB IN ONE DAY OR MORE THAN 4.5LB WITHIN 5 DAYS.   Yes [provider]  losartan (COZAAR) 25 MG tablet Take 25 mg by mouth daily.    Yes [provider]  metoprolol succinate (TOPROL-XL) 100 MG 24 hr tablet TAKE ONE-HALF TABLET BY MOUTH EVERY DAY FOR HEART AND BLOOD PRESSURE. 04/16/20  Yes [provider]  senna-docusate (SENOKOT-S) 8.6-50 MG tablet Take 1 tablet by mouth every other day.  TAKE 1 TABLET BY MOUTH MONDAY,WEDNESDAY,FRIDAY,SATURDAY TO PREVENT CONSTIPATION 06/13/22  Yes [provider]  spironolactone (ALDACTONE) 25 MG tablet Take 12.5 mg by mouth daily. 08/03/17  Yes [provider]  Tiotropium Bromide-Olodaterol 2.5-2.5 MCG/ACT AERS Inhale 2 puffs into the lungs daily.   Yes [provider]  atorvastatin (LIPITOR) 80 MG tablet TAKE ONE-HALF TABLET BY MOUTH AT BEDTIME FOR HEART AND CHOLESTEROL. Patient not taking: Reported on 08/24/2022 05/16/20   [provider]  gabapentin (NEURONTIN) 300 MG capsule Take 300 mg by mouth 3 (three) times daily as needed.    [provider]  sildenafil (VIAGRA) 50 MG tablet Take by mouth as needed.    [provider]    Physical Exam: Vitals:   08/24/22 0930 08/24/22 1042 08/24/22 1057 08/24/22 1100  BP: 97/79  100/67 105/66  Pulse: (!) 56  (!) 48 (!) 57  Resp: (!) 21  20 (!) 21  Temp:  98.2 F (36.8 C)    TempSrc:  Oral    SpO2: 95%  98% 96%  Weight:      Height:       Physical Exam Constitutional:      Appearance: He is normal weight.  HENT:     Head: Normocephalic and atraumatic.     Nose: Nose normal.     Mouth/Throat:     Mouth: Mucous membranes are dry.  Eyes:     Pupils: Pupils are equal, round, and reactive to light.  Cardiovascular:     Rate and Rhythm: Normal rate and regular rhythm.  Pulmonary:     Effort: Pulmonary effort is normal.  Abdominal:     General: Bowel sounds are normal.   Musculoskeletal:        General: Normal range of motion.     Cervical back: Normal range of motion.  Skin:    General: Skin is dry.  Neurological:     General: No focal deficit present.  Psychiatric:        Mood and Affect: Mood normal.     Data Reviewed:  There are no new results to review at this time. US Abdomen Limited RUQ (LIVER/GB) CLINICAL DATA:  Abnormal liver function tests  EXAM: ULTRASOUND ABDOMEN LIMITED RIGHT UPPER QUADRANT  COMPARISON:  Sonogram done on 03/16/2014  FINDINGS: Gallbladder:  Sludge is seen in the dependent portion of gallbladder lumen. There is 5 mm hyperechoic focus in the neck of the gallbladder suggesting possible gallbladder stone. There are no imaging signs of acute cholecystitis.  Common bile duct:  Diameter: 6.5 mm  Liver:  No focal lesion identified. Within normal limits in parenchymal echogenicity. Portal vein is patent on color Doppler imaging with normal direction of blood flow towards the liver.  Other: None.  IMPRESSION: Sludge and stone in gallbladder. There are no signs of acute cholecystitis. There is no dilation of bile ducts. No focal sonographic abnormalities are seen in liver.  Electronically Signed   By: Ernie Avena M.D.   On: 08/24/2022 11:14  Lab Results  Component Value Date   WBC 5.1 08/24/2022   HGB 13.2 08/24/2022   HCT 42.8 08/24/2022   MCV 78.4 (L) 08/24/2022   PLT 117 (L) 08/24/2022   Last metabolic panel Lab Results  Component Value Date   GLUCOSE 118 (H) 08/24/2022   NA 136 08/24/2022   K 4.3 08/24/2022   CL 104 08/24/2022   CO2 24 08/24/2022   BUN 29 (H) 08/24/2022   CREATININE 2.14 (H) 08/24/2022   GFRNONAA 32 (L) 08/24/2022   CALCIUM 7.7 (L) 08/24/2022   PROT 6.3 (L) 08/24/2022   ALBUMIN 3.1 (L) 08/24/2022   BILITOT 1.1 08/24/2022   ALKPHOS 25 (L) 08/24/2022   AST 548 (H) 08/24/2022   ALT 676 (H) 08/24/2022   ANIONGAP 8 08/24/2022    Assessment and Plan: *  COVID Positive generalized malaise, fatigue and weakness x 4-5 days No hypoxia present Start paxlovid renally dosed in setting of AoCKD  Will otherwise symptomatically manage for now Monitor closely  Acute kidney injury superimposed on chronic kidney disease (HCC) Creatinine 2.12 today with baseline creatinine around 1.5 GFR in 30s Clinically dry in the second COVID and decreased p.o. intake Gentle IV fluids in setting of HFrEF Hold offending agents Renal imaging as clinically indicated Monitor  Transaminitis AST 548 and ALT 676 in setting of active COVID  Right upper quadrant ultrasound negative acute hepatic disease Check Tylenol level, hepatitis panel and alcohol level Trend LFTs with treatment Follow   CAD (coronary artery disease) No active chest pain Continue management  Chronic obstructive pulmonary disease, unspecified (HCC) Stable from a resp standpoint  Cont home inhalers    Chronic combined systolic (congestive) and diastolic (congestive) heart failure (HCC) 2D echo April 2024 with EF around 35% S/p AICD w/ no reported discharge  Appears clinically dry in the setting of COVID as well as AKI Gentle IV fluid hydration Monitor volume status closely                                                         Advance Care Planning:   Code Status: Full Code   Consults: None   Family Communication: Family at the bedside   Severity of Illness: The appropriate patient status for this patient is OBSERVATION. Observation status is judged to be reasonable and necessary in order to provide the required intensity of service to ensure the patient's safety. The patient's presenting symptoms, physical exam findings, and initial radiographic and laboratory data in the context of their medical condition is felt to place them at decreased risk for further clinical deterioration. Furthermore, it is anticipated that the patient will be medically stable for discharge from the  hospital within 2 midnights of admission.   Author: Floydene Flock, MD 08/24/2022 12:04 PM  For on call review [email or web address].

## 2022-08-24 NOTE — Evaluation (Signed)
Occupational Therapy Evaluation Patient Details Name: Peter Frey MRN: [id number] DOB: Feb 05, 1950 Today's Date: 08/24/2022   History of Present Illness 72 y.o. male with medical history significant of COPD, HFrEF, coronary artery disease presenting with COVID, AKI, transaminitis.  Patient and family report patient with worsening fatigue malaise over the past 4 to 5 days.  Positive nausea, vomiting weakness.  Positive sick contact of family with similar symptoms.  Mild rhinorrhea and nasal congestion.  Noted baseline history of HFrEF status post AICD with December 2023 for defibrillator discharge and V. tach.   Clinical Impression   Upon entering the room, pt supine in bed and is agreeable to OT intervention. Pt with no pain reported during session but does experience nausea with mobility tasks. Pt reports living at home with wife and being Ind without use of AD at baseline. 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 Pt on RA during session. Pt performs bed mobility without assistance and stands with supervision to ambulate 40' in room with supervision overall. Pt reports not feeling well secondary to nausea and requesting to return to bed. Pt does not have skilled OT need at this time. OT to complete orders and mobility team will continue to work with pt while in hospital. Call bell and all needed items within reach.       If plan is discharge home, recommend the following:      Functional Status Assessment  Patient has not had a recent decline in their functional status  Equipment Recommendations  None recommended by OT       Precautions / Restrictions Precautions Precautions: Fall Restrictions Weight Bearing Restrictions: No      Mobility Bed Mobility Overal bed mobility: Independent                  Transfers   Equipment used: None               General transfer comment: supervision without use of AD      Balance   Sitting-balance support: No upper  extremity supported Sitting balance-Leahy Scale: Normal     Standing balance support: During functional activity Standing balance-Leahy Scale: Good                             ADL either performed or assessed with clinical judgement   ADL Overall ADL's : Needs assistance/impaired                                       General ADL Comments: supervision overall without use of AD     Vision Patient Visual Report: No change from baseline              Pertinent Vitals/Pain Pain Assessment Pain Assessment: No/denies pain     Extremity/Trunk Assessment Upper Extremity Assessment Upper Extremity Assessment: Generalized weakness   Lower Extremity Assessment Lower Extremity Assessment: Generalized weakness   Cervical / Trunk Assessment Cervical / Trunk Assessment: Normal   Communication Communication Communication: No apparent difficulties   Cognition Arousal: Alert Behavior During Therapy: WFL for tasks assessed/performed Overall Cognitive Status: Within Functional Limits for tasks assessed  Home Living Family/patient expects to be discharged to:: Private residence Living Arrangements: Spouse/significant other Available Help at Discharge: Family;Available PRN/intermittently Type of Home: House Home Access: Stairs to enter [employer] of Steps: 2-3 Entrance Stairs-Rails: Right;Left;Can reach both Home Layout: One level     Bathroom Shower/Tub: Chief Strategy Officer: Standard     Home Equipment: None          Prior Functioning/Environment Prior Level of Function : Independent/Modified Independent;Working/employed;Driving               ADLs Comments: Pt Ind in self care and IADLs. He drives and works at [employer] as a Holiday representative.                 OT Goals(Current goals can be found in the care plan section) Acute Rehab OT  Goals Patient Stated Goal: to feel better and go home OT Goal Formulation: With patient Time For Goal Achievement: 08/24/22 Potential to Achieve Goals: Fair  OT Frequency:         AM-PAC OT "6 Clicks" Daily Activity     Outcome Measure Help from another person eating meals?: None Help from another person taking care of personal grooming?: None Help from another person toileting, which includes using toliet, bedpan, or urinal?: None Help from another person bathing (including washing, rinsing, drying)?: None Help from another person to put on and taking off regular upper body clothing?: None Help from another person to put on and taking off regular lower body clothing?: None 6 Click Score: 24   End of Session Nurse Communication: Mobility status  Activity Tolerance: Patient tolerated treatment well Patient left: in bed;with call bell/phone within reach;with bed alarm set                   Time: 1440-1502 OT Time Calculation (min): 22 min Charges:  OT General Charges $OT Visit: 1 Visit OT Evaluation $OT Eval Low Complexity: 1 Low OT Treatments $Therapeutic Activity: 8-22 mins  Jackquline Denmark, MS, OTR/L , CBIS ascom [phone]  08/24/22, 4:30 PM

## 2022-08-24 NOTE — ED Notes (Signed)
US at bedside

## 2022-08-24 NOTE — TOC CM/SW Note (Signed)
Cm received TOC consult for HH/DME. Cm awaiting PT/OT evaluations. Cm continue to follow for toc needs.

## 2022-08-24 NOTE — Progress Notes (Signed)
Paxlovid held w/ contradindication w/ amiodarone per pharmacy Monitor

## 2022-08-24 NOTE — Assessment & Plan Note (Signed)
No active chest pain Continue management

## 2022-08-24 NOTE — ED Triage Notes (Signed)
Patient  BIB [facility] from home for weakness x 4 days. Patient reports that he has loss 15 lbs in 4 days and has not been able to eat or drink. Patient reports that he has been vomiting for 4 days  and last vomit  was at 0400. Patient has extensive cardiac history. Patient denies pain at this time but feels" sick to his stomach"

## 2022-08-24 NOTE — Assessment & Plan Note (Signed)
Stable from a resp standpoint  Cont home inhalers   

## 2022-08-24 NOTE — Assessment & Plan Note (Addendum)
Positive generalized malaise, fatigue and weakness x 4-5 days No hypoxia present Start paxlovid renally dosed in setting of AoCKD  Will otherwise symptomatically manage for now Monitor closely

## 2022-08-24 NOTE — Progress Notes (Signed)
PT Cancellation Note  Patient Details Name: Peter Frey MRN: [id number] DOB: 10-02-1950   Cancelled Treatment:    Reason Eval/Treat Not Completed: Other (comment).  PT consult received.  Chart reviewed.  Nurse reports pt recently finished with OT and recommending letting pt rest at this time (pt feeling weak).  Will re-attempt PT evaluation at a later date/time.  Hendricks Limes, PT 08/24/22, 3:35 PM

## 2022-08-24 NOTE — Assessment & Plan Note (Addendum)
Creatinine 2.12 today with baseline creatinine around 1.5 GFR in 30s Clinically dry in the second COVID and decreased p.o. intake Gentle IV fluids in setting of HFrEF Hold offending agents Renal imaging as clinically indicated Monitor

## 2022-08-24 NOTE — Assessment & Plan Note (Signed)
AST 548 and ALT 676 in setting of active COVID  Right upper quadrant ultrasound negative acute hepatic disease Check Tylenol level, hepatitis panel and alcohol level Trend LFTs with treatment Follow

## 2022-08-24 NOTE — Assessment & Plan Note (Addendum)
2D echo April 2024 with EF around 35% S/p AICD w/ no reported discharge  Appears clinically dry in the setting of COVID as well as AKI Gentle IV fluid hydration Monitor volume status closely

## 2022-08-24 NOTE — ED Provider Triage Note (Signed)
Emergency Medicine Provider Triage Evaluation Note  Peter Frey , a 72 y.o. male  was evaluated in triage.  Pt complains of weakness and persistent N/V for several days.  Review of Systems  Positive: N/V, weakness, dizziness when standing up Negative: chest pain, abdominal pain, fever, dysuria  Physical Exam  BP 104/63   Pulse (!) 58   Temp 97.9 F (36.6 C) (Oral)   Resp 12   Ht 1.803 m (5\' 11" )   Wt 74.8 kg   SpO2 97%   BMI 23.00 kg/m  Gen:   Awake, alert, communicative, no obvious distress Resp:  Normal effort , speaking easily and comfortably MSK:   Moves extremities without difficulty   Medical Decision Making  Medically screening exam initiated at 6:35 AM.  Appropriate orders placed.  Mort Sawyers was informed that the remainder of the evaluation will be completed by another provider, this initial triage assessment does not replace that evaluation, and the importance of remaining in the ED until their evaluation is complete.  Stable at this time for morning physician.  Ordered appropriate labs, EKG.  Ordered Zofran 4 mg IV for nausea.  Ordered NS bolus for volume depletion, but also reviewed patient's echo which showed EF of about 30-35%, so will hydrate cautiously.   Loleta Rose, MD 08/24/22 [phone]

## 2022-08-25 DIAGNOSIS — U071 COVID-19: Secondary | ICD-10-CM | POA: Diagnosis not present

## 2022-08-25 MED ORDER — ENSURE ENLIVE PO LIQD
237.0000 mL | Freq: Three times a day (TID) | ORAL | Status: DC
  Administered 2022-08-25: 237 mL via ORAL

## 2022-08-25 MED ORDER — ADULT MULTIVITAMIN W/MINERALS CH: 1.0000 | ORAL_TABLET | Freq: Every day | ORAL | 0 refills | Status: AC

## 2022-08-25 MED ORDER — ENSURE ENLIVE PO LIQD: 237.0000 mL | Freq: Three times a day (TID) | ORAL | 12 refills | Status: AC

## 2022-08-25 NOTE — Discharge Summary (Signed)
Physician Discharge Summary   Patient: Peter Frey MRN: [id number] DOB: 12-Jun-1950  Admit date:     08/24/2022  Discharge date: 08/25/22  Discharge Physician: Enedina Finner   [facility]P: [facility]   Recommendations at discharge:    Patient instructed to keep log of blood pressure at home. If his systolic blood pressure is less than 120 on any given day hold blood pressure meds. Holding Lipitor for now due to elevated liver enzymes which are improving. Discussed with your PCP at the [facility] before resuming it follow-up PCP in 1 to 2 week  Discharge Diagnoses: Principal Problem:   COVID Active Problems:   Acute kidney injury superimposed on chronic kidney disease (HCC)   Chronic combined systolic (congestive) and diastolic (congestive) heart failure (HCC)   Chronic obstructive pulmonary disease, unspecified (HCC)   CAD (coronary artery disease)   Transaminitis  Peter Frey is a 72 y.o. male with medical history significant of COPD, HFrEF, coronary artery disease presenting with COVID, AKI, transaminitis.  Patient and family report patient with worsening fatigue malaise over the past 4 to 5 days.  Positive nausea, vomiting weakness.  Positive sick contact of family with similar symptoms.  Mild rhinorrhea and nasal congestion.  Noted baseline history of HFrEF status post AICD with December 2023 for defibrillator discharge and V. tach.    COVID --Positive generalized malaise, fatigue and weakness x 4-5 days --No hypoxia present --symptomatically manage for now --- no fever. Patient overall feels at baseline. Ambulated with physical therapy no PT needs for home   Acute kidney injury superimposed on chronic kidney disease (HCC) due to poor PO intake with nausea --Creatinine 2.12 today with baseline creatinine around 1.5 --Clinically dry in the second COVID and decreased p.o. intake --Gentle IV fluids in setting of HFrEF -- creatinine much improved. Patient overall feels better after  hydration  Transaminitis AST 548 and ALT 676 in setting of active COVID -- trending down. Patient denies any right upper quadrant abdominal pain. --Right upper quadrant ultrasound negative acute hepatic disease, gallstone noted-- patient and family informed of results -- hold Lipitor for now. Patient advised to follow-up PCP discussed and then resume if appropriate  CAD (coronary artery disease) --No active chest pain --resumed home medications. Recommended patient to keep log of blood pressure at home if systolic less than 120 hold BP meds   Chronic obstructive pulmonary disease, unspecified (HCC) --Stable from a resp standpoint  --Cont home inhalers    Chronic combined systolic (congestive) and diastolic (congestive) heart failure [facility]) --2D echo April 2024 with EF around 35% --S/p AICD w/ no reported discharge   Overall improved. Worked with PT. No PT needs. Discharge to home. Patient and wife agreeable. Patient advised to wear a mask for at least 5 to 7 days. Follow-up PCP as outpatient                                                         Advance Care Planning:   Code Status: Full Code   Consults: None   Family Communication: Family at the bedside      Diet recommendation:  Discharge Diet Orders (From admission, onward)     Start     Ordered   08/25/22 0000  Diet - low sodium heart healthy  08/25/22 1328            DISCHARGE MEDICATION: Allergies as of 08/25/2022       Reactions   Lisinopril Swelling   Lip swelling Other Reaction(s): Angioedema        Medication List     STOP taking these medications    atorvastatin 80 MG tablet Commonly known as: LIPITOR       TAKE these medications    acetaminophen 500 MG tablet Commonly known as: TYLENOL Take 500 mg by mouth every 6 (six) hours as needed.   albuterol 108 (90 Base) MCG/ACT inhaler Commonly known as: VENTOLIN HFA Inhale 1 puff into the lungs every 4 (four) hours as needed for  wheezing or shortness of breath.   amiodarone 400 MG tablet Commonly known as: PACERONE Take 1 tablet (400 mg total) by mouth 2 (two) times daily for 7 days, THEN 1 tablet (400 mg total) daily for 21 days. Start taking on: January 05, 2022   aspirin 81 MG tablet Take 81 mg by mouth daily.   empagliflozin 25 MG Tabs tablet Commonly known as: JARDIANCE Take 12.5 mg by mouth daily.   ezetimibe 10 MG tablet Commonly known as: ZETIA Take 1 tablet by mouth daily.   feeding supplement Liqd Take 237 mLs by mouth 3 (three) times daily between meals.   furosemide 40 MG tablet Commonly known as: LASIX Take 40 mg by mouth daily as needed for fluid. TAKE EXRTA DOSE IF GAINING MORE THAN 2.5LB IN ONE DAY OR MORE THAN 4.5LB WITHIN 5 DAYS.   gabapentin 300 MG capsule Commonly known as: NEURONTIN Take 300 mg by mouth 3 (three) times daily as needed.   losartan 25 MG tablet Commonly known as: COZAAR Take 25 mg by mouth daily.   metoprolol succinate 100 MG 24 hr tablet Commonly known as: TOPROL-XL TAKE ONE-HALF TABLET BY MOUTH EVERY DAY FOR HEART AND BLOOD PRESSURE.   multivitamin with minerals Tabs tablet Take 1 tablet by mouth daily. Start taking on: August 26, 2022   senna-docusate 8.6-50 MG tablet Commonly known as: Senokot-S Take 1 tablet by mouth every other day.  TAKE 1 TABLET BY MOUTH MONDAY,WEDNESDAY,FRIDAY,SATURDAY TO PREVENT CONSTIPATION   sildenafil 50 MG tablet Commonly known as: VIAGRA Take by mouth as needed.   spironolactone 25 MG tablet Commonly known as: ALDACTONE Take 12.5 mg by mouth daily.   Tiotropium Bromide-Olodaterol 2.5-2.5 MCG/ACT Aers Inhale 2 puffs into the lungs daily.        Follow-up Information     Center, [facility]. Schedule an appointment as soon as possible for a visit in 1 week(s).   Specialty: General Practice Why: Hospital follow-up for COVID Contact information: [address] [place] [address] [phone]                 Discharge Exam: Ceasar Mons Weights   08/24/22 0629  Weight: 74.8 kg   Alert and oriented times three respiratory clear to auscultation cardiovascular both heart sounds normal rate rhythm regular abdomen soft no tenderness. No guarding rigidity  Condition at discharge: fair  The results of significant diagnostics from this hospitalization (including imaging, microbiology, ancillary and laboratory) are listed below for reference.   Imaging Studies: US Abdomen Limited RUQ (LIVER/GB)  Result Date: 08/24/2022 CLINICAL DATA:  Abnormal liver function tests EXAM: ULTRASOUND ABDOMEN LIMITED RIGHT UPPER QUADRANT COMPARISON:  Sonogram done on 03/16/2014 FINDINGS: Gallbladder: Sludge is seen in the dependent portion of gallbladder lumen. There is 5 mm hyperechoic  focus in the neck of the gallbladder suggesting possible gallbladder stone. There are no imaging signs of acute cholecystitis. Common bile duct: Diameter: 6.5 mm Liver: No focal lesion identified. Within normal limits in parenchymal echogenicity. Portal vein is patent on color Doppler imaging with normal direction of blood flow towards the liver. Other: None. IMPRESSION: Sludge and stone in gallbladder. There are no signs of acute cholecystitis. There is no dilation of bile ducts. No focal sonographic abnormalities are seen in liver. Electronically Signed   By: Ernie Avena M.D.   On: 08/24/2022 11:14    Microbiology: Results for orders placed or performed during the hospital encounter of 08/24/22  SARS Coronavirus 2 by RT PCR (hospital order, performed in [facility] hospital lab) *cepheid single result test* Anterior Nasal Swab     Status: Abnormal   Collection Time: 08/24/22  7:38 AM   Specimen: Anterior Nasal Swab  Result Value Ref Range Status   SARS Coronavirus 2 by RT PCR POSITIVE (A) NEGATIVE Final    Comment: (NOTE) SARS-CoV-2 target nucleic acids are DETECTED  SARS-CoV-2 RNA is generally detectable in upper respiratory  specimens  during the acute phase of infection.  Positive results are indicative  of the presence of the identified virus, but do not rule out bacterial infection or co-infection with other pathogens not detected by the test.  Clinical correlation with patient history and  other diagnostic information is necessary to determine patient infection status.  The expected result is negative.  Fact Sheet for Patients:   RoadLapTop.co.za   Fact Sheet for Healthcare Providers:   http://kim-miller.com/    This test is not yet approved or cleared by the [place] FDA and  has been authorized for detection and/or diagnosis of SARS-CoV-2 by FDA under an Emergency Use Authorization (EUA).  This EUA will remain in effect (meaning this test can be used) for the duration of  the COVID-19 declaration under Section 564(b)(1)  of the Act, 21 U.S.C. section 360-bbb-3(b)(1), unless the authorization is terminated or revoked sooner.   Performed at [facility], [address] [place], [place] [address]     Labs: CBC: Recent Labs  Lab 08/24/22 0641 08/25/22 0523  WBC 5.1 3.1*  NEUTROABS 3.7  --   HGB 13.2 13.0  HCT 42.8 39.8  MCV 78.4* 74.1*  PLT 117* 111*   Basic Metabolic Panel: Recent Labs  Lab 08/24/22 0856 08/24/22 2234 08/25/22 0523  NA 136 136 138  K 4.3 4.6 4.1  CL 104 104 108  CO2 24 24 21*  GLUCOSE 118* 100* 89  BUN 29* 26* 26*  CREATININE 2.14* 1.62* 1.30*  CALCIUM 7.7* 7.9* 7.8*   Liver Function Tests: Recent Labs  Lab 08/24/22 0856 08/24/22 2234 08/25/22 0523  AST 548* 358* 316*  ALT 676* 549* 433*  ALKPHOS 25* 33* 30*  BILITOT 1.1 1.2 1.3*  PROT 6.3* 6.5 6.5  ALBUMIN 3.1* 3.1* 2.8*    Discharge time spent: greater than 30 minutes.  Signed: Enedina Finner, MD [facility] 08/25/2022

## 2022-08-25 NOTE — TOC Progression Note (Signed)
Transition of Care [facility]) - Progression Note    Patient Details  Name: Peter Frey MRN: [id number] Date of Birth: 07/14/1950  Transition of Care [facility]) CM/SW Contact  Marlowe Sax, RN Phone Number: 08/25/2022, 11:28 AM  Clinical Narrative:     Transition of Care [facility]) - Inpatient Brief Assessment   Patient Details  Name: Peter Frey MRN: [id number] Date of Birth: [birth year]  Transition of Care [facility]) CM/SW Contact:    Marlowe Sax, RN Phone Number: 08/25/2022, 11:29 AM   Clinical Narrative:    Transition of Care Asessment: Insurance and Status: Insurance coverage has been reviewed Patient has primary care physician: Yes ([facility]) Home environment has been reviewed: independent Prior level of function:: independent, Works at [employer]: No current home services Social Determinants of Health Reivew: SDOH reviewed no interventions necessary Readmission risk has been reviewed: Yes Transition of care needs: no transition of care needs at this time        Expected Discharge Plan and Services                                               Social Determinants of Health (SDOH) Interventions SDOH Screenings   Food Insecurity: No Food Insecurity (08/24/2022)  Housing: Low Risk  (08/24/2022)  Transportation Needs: No Transportation Needs (08/24/2022)  Utilities: Not At Risk (08/24/2022)  Depression (PHQ2-9): Low Risk  (08/15/2020)  Financial Resource Strain: Low Risk  (04/24/2022)   Received from [facility]  Tobacco Use: Medium Risk (08/24/2022)    Readmission Risk Interventions     No data to display

## 2022-08-25 NOTE — Progress Notes (Signed)
Initial Nutrition Assessment  DOCUMENTATION CODES:   Not applicable  INTERVENTION:   Ensure Enlive po TID, each supplement provides 350 kcal and 20 grams of protein.  Magic cup TID with meals, each supplement provides 290 kcal and 9 grams of protein  MVI po daily   Liberalize diet   Pt at high refeed risk; recommend monitor potassium, magnesium and phosphorus labs daily until stable  Daily weights   NUTRITION DIAGNOSIS:   Inadequate oral intake related to acute illness (COVID 19) as evidenced by per patient/family report.  GOAL:   Patient will meet greater than or equal to 90% of their needs  MONITOR:   PO intake, Supplement acceptance, Labs, Weight trends, Skin, I & O's  REASON FOR ASSESSMENT:   Malnutrition Screening Tool    ASSESSMENT:   72 y/o male with h/o CHF, s/p AICD (2023), COPD, CAD s/p CABG, OSA, PAD, MI and substance abuse (1970s) who is admitted with COVID 19, AKI and transaminitis.  RD working remotely.  Spoke with pt via phone. Pt reports decreased appetite and oral intake for the past week r/t COVID 19. Pt also reports some nausea pta. Pt reports that he has lost down from his UBW of 165lbs down to 151lbs; pt has not been weighed this admission. Pt reports that he ate some grapes this morning but reports that the menu is so limited from the heart healthy, carbohydrate modified diet that he could not find anything to order. RD discussed with pt the importance of adequate nutrition needed to preserve lean muscle. Pt reports that he drinks vanilla Ensure. RD will add supplements to help pt meet his estimated needs. RD will also liberalize pt's diet. Pt is likely at high refeed risk. RD will obtain nutrition related exam at follow up. Pt is at high risk for developing malnutrition.    Medications reviewed and include: aspirin, lovenox, folic acid, MVI, thiamine, NaCl @50ml /hr  Labs reviewed: K 4.1 wnl, BUN 26(H), creat 1.30(H), alk phos 30(L), AST 316(H),  ALT 433(H), tbili 1.3(H) Lipase- 57(H)- 8/12 Wbc- 3.1(L)  NUTRITION - FOCUSED PHYSICAL EXAM: Unable to obtain at this time   Diet Order:   Diet Order             Diet regular Room service appropriate? Yes; Fluid consistency: Thin  Diet effective now                  EDUCATION NEEDS:   Education needs have been addressed  Skin:  Skin Assessment: Reviewed RN Assessment  Last BM:  8/10  Height:   Ht Readings from Last 1 Encounters:  08/24/22 5\' 11"  (1.803 m)    Weight:   Wt Readings from Last 1 Encounters:  08/24/22 74.8 kg    Ideal Body Weight:  78 kg  BMI:  Body mass index is 23 kg/m.  Estimated Nutritional Needs:   Kcal:  2100-2400kcal/day  Protein:  105-120g/day  Fluid:  2.0L/day  Betsey Holiday MS, RD, LDN Please refer to [facility] for RD and/or RD on-call/weekend/after hours pager

## 2022-08-25 NOTE — Discharge Instructions (Addendum)
Patient instructed to keep log of blood pressure at home. If his systolic blood pressure is less than 120 on any given day hold blood pressure meds.  Holding Lipitor for now due to elevated liver enzymes which are improving. Discussed with your PCP at the [place] before resuming it

## 2022-08-25 NOTE — Plan of Care (Signed)

## 2022-08-25 NOTE — Evaluation (Signed)
Physical Therapy Evaluation Patient Details Name: Peter Frey MRN: [id number] DOB: 1950/02/18 Today's Date: 08/25/2022  History of Present Illness  Pt is a 72 y.o. male presenting to hospital 08/24/22 with c/o weakness, N/V x4 days, persistent cough, and decreased appetite.  Pt admitted with COVID, AKI, and transaminitis.  PMH includes h/o CAD, cardiomyopathy s/p AICD, COPD, CHF, MI. CABG.  Clinical Impression  Prior to recent medical concerns, pt was independent with functional mobility; lives with his wife.  Currently pt is independent with bed mobility; independent with transfers; and CGA to ambulate 100 feet (no AD use).  Generalized weakness and decreased activity tolerance noted.  Mild unsteadiness noted at times during ambulation but no overt loss of balance noted.  Pt would currently benefit from skilled PT to address noted impairments and functional limitations (see below for any additional details).  Upon hospital discharge, no further PT needs anticipated.      If plan is discharge home, recommend the following: A little help with walking and/or transfers;A little help with bathing/dressing/bathroom;Assistance with cooking/housework;Assist for transportation;Help with stairs or ramp for entrance   Can travel by private vehicle    Yes    Equipment Recommendations None recommended by PT  Recommendations for Other Services       Functional Status Assessment Patient has had a recent decline in their functional status and demonstrates the ability to make significant improvements in function in a reasonable and predictable amount of time.     Precautions / Restrictions Precautions Precautions: Fall Restrictions Weight Bearing Restrictions: No      Mobility  Bed Mobility Overal bed mobility: Independent             General bed mobility comments: Supine to/from sitting without any noted difficulties    Transfers Overall transfer level: Independent Equipment used:  None               General transfer comment: steady transfer from bed    Ambulation/Gait Ambulation/Gait assistance: Contact guard assist Gait Distance (Feet): 100 Feet Assistive device: None Gait Pattern/deviations: Step-through pattern, Decreased step length - right, Decreased step length - left Gait velocity: decreased     General Gait Details: mild unsteadiness at times but no overt loss of balance noted  Stairs            Wheelchair Mobility     Tilt Bed    Modified Rankin (Stroke Patients Only)       Balance Overall balance assessment: Needs assistance Sitting-balance support: No upper extremity supported, Feet supported Sitting balance-Leahy Scale: Normal Sitting balance - Comments: steady reaching outside BOS   Standing balance support: No upper extremity supported, During functional activity Standing balance-Leahy Scale: Good Standing balance comment: mild unsteadiness at times during ambulation but no loss of balance noted                             Pertinent Vitals/Pain Pain Assessment Pain Assessment: No/denies pain SpO2 on room air stable and WFL throughout treatment session.  HR 52-61 bpm during sessions activities.     Home Living Family/patient expects to be discharged to:: Private residence Living Arrangements: Spouse/significant other Available Help at Discharge: Family;Available PRN/intermittently Type of Home: House Home Access: Stairs to enter Entrance Stairs-Rails: Right;Left;Can reach both Entrance Stairs-Number of Steps: 2   Home Layout: One level Home Equipment: None      Prior Function Prior Level of Function : Independent/Modified Independent;Working/employed;Driving  Mobility Comments: No h/o any falls. ADLs Comments: Independent in self care and IADL's.  (+) driving.  Works at [employer] as a Holiday representative.     Extremity/Trunk Assessment   Upper Extremity Assessment Upper Extremity  Assessment: Generalized weakness    Lower Extremity Assessment Lower Extremity Assessment: Generalized weakness    Cervical / Trunk Assessment Cervical / Trunk Assessment: Normal  Communication   Communication Communication: No apparent difficulties Cueing Techniques: Verbal cues  Cognition Arousal: Alert Behavior During Therapy: WFL for tasks assessed/performed Overall Cognitive Status: Within Functional Limits for tasks assessed                                          General Comments  Nursing cleared pt for participation in physical therapy.  Pt agreeable to PT session.  Pt's wife and family member present during session.    Exercises     Assessment/Plan    PT Assessment Patient needs continued PT services  PT Problem List Decreased strength;Decreased activity tolerance;Decreased balance;Decreased mobility       PT Treatment Interventions DME instruction;Gait training;Stair training;Functional mobility training;Therapeutic activities;Therapeutic exercise;Balance training;Patient/family education    PT Goals (Current goals can be found in the Care Plan section)  Acute Rehab PT Goals Patient Stated Goal: to improve strength and endurance PT Goal Formulation: With patient Time For Goal Achievement: 09/08/22 Potential to Achieve Goals: Good    Frequency Min 1X/week     Co-evaluation               AM-PAC PT "6 Clicks" Mobility  Outcome Measure Help needed turning from your back to your side while in a flat bed without using bedrails?: None Help needed moving from lying on your back to sitting on the side of a flat bed without using bedrails?: None Help needed moving to and from a bed to a chair (including a wheelchair)?: None Help needed standing up from a chair using your arms (e.g., wheelchair or bedside chair)?: None Help needed to walk in hospital room?: A Little Help needed climbing 3-5 steps with a railing? : A Little 6 Click Score:  22    End of Session   Activity Tolerance: Patient limited by fatigue Patient left: in bed;with call bell/phone within reach;with bed alarm set;with family/visitor present Nurse Communication: Mobility status;Precautions PT Visit Diagnosis: Other abnormalities of gait and mobility (R26.89);Muscle weakness (generalized) (M62.81)    Time: 1610-9604 PT Time Calculation (min) (ACUTE ONLY): 19 min   Charges:   PT Evaluation $PT Eval Low Complexity: 1 Low   PT General Charges $$ ACUTE PT VISIT: 1 Visit        Hendricks Limes, PT 08/25/22, 11:12 AM
